# Patient Record
Sex: Male | Born: 1985
Health system: Southern US, Community
[De-identification: ages and names within clinical notes are randomized; demographics above are authoritative.]

## PROBLEM LIST (undated history)

## (undated) DIAGNOSIS — Z808 Family history of malignant neoplasm of other organs or systems: Secondary | ICD-10-CM

## (undated) DIAGNOSIS — Z8481 Family history of carrier of genetic disease: Secondary | ICD-10-CM

## (undated) DIAGNOSIS — K219 Gastro-esophageal reflux disease without esophagitis: Secondary | ICD-10-CM

## (undated) DIAGNOSIS — F32A Depression, unspecified: Secondary | ICD-10-CM

## (undated) DIAGNOSIS — F329 Major depressive disorder, single episode, unspecified: Secondary | ICD-10-CM

## (undated) DIAGNOSIS — I10 Essential (primary) hypertension: Secondary | ICD-10-CM

## (undated) DIAGNOSIS — Z8042 Family history of malignant neoplasm of prostate: Secondary | ICD-10-CM

## (undated) DIAGNOSIS — G479 Sleep disorder, unspecified: Secondary | ICD-10-CM

## (undated) DIAGNOSIS — Z800 Family history of malignant neoplasm of digestive organs: Secondary | ICD-10-CM

## (undated) DIAGNOSIS — F319 Bipolar disorder, unspecified: Secondary | ICD-10-CM

## (undated) HISTORY — DX: Family history of malignant neoplasm of prostate: Z80.42

## (undated) HISTORY — DX: Family history of carrier of genetic disease: Z84.81

## (undated) HISTORY — PX: NO PAST SURGERIES: SHX2092

## (undated) HISTORY — DX: Essential (primary) hypertension: I10

## (undated) HISTORY — DX: Family history of malignant neoplasm of other organs or systems: Z80.8

## (undated) HISTORY — DX: Gastro-esophageal reflux disease without esophagitis: K21.9

## (undated) HISTORY — DX: Family history of malignant neoplasm of digestive organs: Z80.0

---
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
## 2008-06-18 ENCOUNTER — Emergency Department (HOSPITAL_COMMUNITY): Admission: EM | Admit: 2008-06-18 | Discharge: 2008-06-18 | Payer: Self-pay | Admitting: Emergency Medicine

## 2008-11-28 ENCOUNTER — Emergency Department (HOSPITAL_COMMUNITY): Admission: EM | Admit: 2008-11-28 | Discharge: 2008-11-28 | Payer: Self-pay | Admitting: Emergency Medicine

## 2009-03-02 ENCOUNTER — Emergency Department (HOSPITAL_COMMUNITY): Admission: EM | Admit: 2009-03-02 | Discharge: 2009-03-02 | Payer: Self-pay | Admitting: Emergency Medicine

## 2010-04-11 ENCOUNTER — Emergency Department (HOSPITAL_COMMUNITY)
Admission: EM | Admit: 2010-04-11 | Discharge: 2010-04-11 | Payer: Self-pay | Source: Home / Self Care | Admitting: Emergency Medicine

## 2010-08-12 ENCOUNTER — Emergency Department (HOSPITAL_COMMUNITY)
Admission: EM | Admit: 2010-08-12 | Discharge: 2010-08-12 | Disposition: A | Discharge status: Home / Self Care | Payer: Self-pay | Attending: Emergency Medicine | Admitting: Emergency Medicine

## 2010-08-12 ENCOUNTER — Emergency Department (HOSPITAL_COMMUNITY): Payer: Self-pay

## 2010-08-12 DIAGNOSIS — F17200 Nicotine dependence, unspecified, uncomplicated: Secondary | ICD-10-CM | POA: Insufficient documentation

## 2010-08-12 DIAGNOSIS — IMO0002 Reserved for concepts with insufficient information to code with codable children: Secondary | ICD-10-CM | POA: Insufficient documentation

## 2010-08-12 DIAGNOSIS — F319 Bipolar disorder, unspecified: Secondary | ICD-10-CM | POA: Insufficient documentation

## 2010-08-12 DIAGNOSIS — T182XXA Foreign body in stomach, initial encounter: Secondary | ICD-10-CM | POA: Insufficient documentation

## 2011-11-21 ENCOUNTER — Encounter (HOSPITAL_COMMUNITY): Payer: Self-pay | Admitting: *Deleted

## 2011-11-21 ENCOUNTER — Emergency Department (HOSPITAL_COMMUNITY)
Admission: EM | Admit: 2011-11-21 | Discharge: 2011-11-21 | Disposition: A | Discharge status: Home / Self Care | Payer: Self-pay | Attending: Emergency Medicine | Admitting: Emergency Medicine

## 2011-11-21 DIAGNOSIS — L0291 Cutaneous abscess, unspecified: Secondary | ICD-10-CM

## 2011-11-21 DIAGNOSIS — F17200 Nicotine dependence, unspecified, uncomplicated: Secondary | ICD-10-CM | POA: Insufficient documentation

## 2011-11-21 DIAGNOSIS — H60399 Other infective otitis externa, unspecified ear: Secondary | ICD-10-CM | POA: Insufficient documentation

## 2011-11-21 MED ORDER — DOXYCYCLINE HYCLATE 100 MG PO CAPS: 100.0000 mg | ORAL_CAPSULE | Freq: Two times a day (BID) | ORAL | Status: AC

## 2011-11-21 MED ORDER — HYDROCODONE-ACETAMINOPHEN 5-325 MG PO TABS: ORAL_TABLET | ORAL | Status: DC

## 2011-11-21 MED ORDER — LIDOCAINE HCL (PF) 1 % IJ SOLN
INTRAMUSCULAR | Status: AC
  Administered 2011-11-21: 5 mL
  Filled 2011-11-21: qty 5

## 2011-11-21 MED ORDER — DOXYCYCLINE HYCLATE 100 MG PO TABS
100.0000 mg | ORAL_TABLET | Freq: Once | ORAL | Status: AC
  Administered 2011-11-21: 100 mg via ORAL
  Filled 2011-11-21: qty 1

## 2011-11-21 NOTE — ED Notes (Signed)
Abscess to back of left ear x 4 days.

## 2011-11-21 NOTE — Discharge Instructions (Signed)
Abscess An abscess (boil or furuncle) is an infected area that contains a collection of pus.  SYMPTOMS Signs and symptoms of an abscess include pain, tenderness, redness, or hardness. You may feel a moveable soft area under your skin. An abscess can occur anywhere in the body.  TREATMENT  A surgical cut (incision) may be made over your abscess to drain the pus. Gauze may be packed into the space or a drain may be looped through the abscess cavity (pocket). This provides a drain that will allow the cavity to heal from the inside outwards. The abscess may be painful for a few days, but should feel much better if it was drained.  Your abscess, if seen early, may not have localized and may not have been drained. If not, another appointment may be required if it does not get better on its own or with medications. HOME CARE INSTRUCTIONS   Only take over-the-counter or prescription medicines for pain, discomfort, or fever as directed by your caregiver.   Take your antibiotics as directed if they were prescribed. Finish them even if you start to feel better.   Keep the skin and clothes clean around your abscess.   If the abscess was drained, you will need to use gauze dressing to collect any draining pus. Dressings will typically need to be changed 3 or more times a day.   The infection may spread by skin contact with others. Avoid skin contact as much as possible.   Practice good hygiene. This includes regular hand washing, cover any draining skin lesions, and do not share personal care items.   If you participate in sports, do not share athletic equipment, towels, whirlpools, or personal care items. Shower after every practice or tournament.   If a draining area cannot be adequately covered:   Do not participate in sports.   Children should not participate in day care until the wound has healed or drainage stops.   If your caregiver has given you a follow-up appointment, it is very important  to keep that appointment. Not keeping the appointment could result in a much worse infection, chronic or permanent injury, pain, and disability. If there is any problem keeping the appointment, you must call back to this facility for assistance.  SEEK MEDICAL CARE IF:   You develop increased pain, swelling, redness, drainage, or bleeding in the wound site.   You develop signs of generalized infection including muscle aches, chills, fever, or a general ill feeling.   You have an oral temperature above 102 F (38.9 C).  MAKE SURE YOU:   Understand these instructions.   Will watch your condition.   Will get help right away if you are not doing well or get worse.  Document Released: 03/07/2005 Document Revised: 05/17/2011 Document Reviewed: 12/30/2007 [facility] Patient Information 2012 [place], [place].Heat Therapy Your caregiver advises heat therapy for your condition. Heat applications help reduce pain and muscle spasm around injuries or areas of inflammation. They also increase blood flow to the area which can speed healing. Moist heat is commonly used to help heal skin infections. Heat treatments should be used for about 30-40 minutes every 2-4 hours. Shorter treatments should be used if there is discomfort. Different forms of heat therapy are:  Warm water - Use a basin or tub filled with heated water; change it often to keep the water hot. The water temperature should not be uncomfortable to the skin.   Hot packs - Use several bath towels soaked in hot  water and lightly wrung out. These should be changed every 5-10 minutes. You can buy commercially-available packs that provide more sustained heat. Hot water bottles are not recommended because they give only a small amount of heat.   Electric heating pads - These may be used for dry heat only. Do not use wet material around a regular heating pad because of the risk of electrical shock. Do not leave heating pads on for long periods as they can  burn the skin or cause permanent discoloration. Do not lie on top of a heating pad because, again, this can cause a burn.   Heat lamps - Use an infrared light. Keep the bulb 15-25 inches from the skin. Watch for signs of excessive heat (blotchy areas will appear).  Be cautious with heat therapy to avoid burning the skin. You should not use heat therapy without careful medical supervision if you have: circulation problems, numbness or unusual swelling in the area to be treated. Document Released: 05/28/2005 Document Revised: 05/17/2011 Document Reviewed: 11/23/2006 [facility] Patient Information 2012 [place], [place].   Take the meds as directed.  Take ibuprofen 800 mg every 8 hrs with food.  Apply warm compresses several times daily.  Remove packing from ear in 5-6 days.  Follow up with your MD as needed.

## 2011-11-21 NOTE — ED Notes (Signed)
Pa in to d/c abscess

## 2011-11-21 NOTE — ED Provider Notes (Signed)
History     CSN: [id number]  Arrival date & time 11/21/11  1723   First MD Initiated Contact with Patient 11/21/11 1735      Chief Complaint  Patient presents with  . Abscess    (Consider location/radiation/quality/duration/timing/severity/associated sxs/prior treatment) HPI Comments: Gradual swelling behind lobe of L ear.  Has been squeezing.  Patient is a 26 y.o. male presenting with abscess. The history is provided by the patient. No language interpreter was used.  Abscess  This is a new problem. Episode onset: 1 week ago. The onset was sudden. The problem has been gradually worsening. The problem is moderate. The abscess is characterized by painfulness. Pertinent negatives include no fever. There were no sick contacts. He has received no recent medical care.    History reviewed. No pertinent past medical history.  History reviewed. No pertinent past surgical history.  No family history on file.  History  Substance Use Topics  . Smoking status: Current Everyday Smoker    Types: Cigarettes  . Smokeless tobacco: Not on file  . Alcohol Use: No      Review of Systems  Constitutional: Negative for fever and chills.  Skin:       Abscess   All other systems reviewed and are negative.    Allergies  Review of patient's allergies indicates no known allergies.  Home Medications   Current Outpatient Rx  Name Route Sig Dispense Refill  . QUETIAPINE FUMARATE ER 200 MG PO TB24 Oral Take 200 mg by mouth at bedtime.    Marland Kitchen DOXYCYCLINE HYCLATE 100 MG PO CAPS Oral Take 1 capsule (100 mg total) by mouth 2 (two) times daily. 20 capsule 0  . HYDROCODONE-ACETAMINOPHEN 5-325 MG PO TABS  One tab po q 4-6 hrs prn pain 12 tablet 0    BP 144/75  Pulse 96  Temp 98.6 F (37 C) (Oral)  Resp 20  Ht 6\' 1"  (1.854 m)  Wt 285 lb (129.275 kg)  BMI 37.60 kg/m2  SpO2 98%  Physical Exam  Nursing note and vitals reviewed. Constitutional: He is oriented to person, place, and time. He  appears well-developed and well-nourished.  HENT:  Head: Normocephalic and atraumatic.  Right Ear: Hearing and external ear normal.  Left Ear: Hearing normal.  Ears:  Eyes: EOM are normal.  Neck: Normal range of motion.  Cardiovascular: Normal rate, regular rhythm, normal heart sounds and intact distal pulses.   Pulmonary/Chest: Effort normal and breath sounds normal. No respiratory distress.  Abdominal: Soft. He exhibits no distension. There is no tenderness.  Musculoskeletal: Normal range of motion.  Neurological: He is alert and oriented to person, place, and time.  Skin: Skin is warm and dry.  Psychiatric: He has a normal mood and affect. Judgment normal.    ED Course  INCISION AND DRAINAGE Date/Time: 11/21/2011 6:05 PM Performed by: Worthy Rancher Authorized by: Worthy Rancher Consent: Verbal consent obtained. Written consent not obtained. Risks and benefits: risks, benefits and alternatives were discussed Consent given by: patient Patient understanding: patient states understanding of the procedure being performed Site marked: the operative site was not marked Imaging studies: imaging studies not available Required items: required blood products, implants, devices, and special equipment available Patient identity confirmed: verbally with patient Time out: Immediately prior to procedure a "time out" was called to verify the correct patient, procedure, equipment, support staff and site/side marked as required. Type: abscess Body area: head/neck Location details: left external ear Anesthesia: local infiltration Local anesthetic: lidocaine 1%  without epinephrine Anesthetic total: 2 ml Patient sedated: no Scalpel size: 11 Needle gauge: 25 ga. Incision type: single straight Complexity: simple Drainage: purulent Drainage amount: copious Packing material: 1/4 in gauze (~ 3 inches inserted) Patient tolerance: Patient tolerated the procedure well with no immediate  complications.   (including critical care time)  Labs Reviewed - No data to display No results found.   1. Abscess       MDM  Simple abscess I&D. rx-doxycycline , 20 rx-hydrocodone, 12 OTC ibuprofen 800 mg TID.  Warm compresses        Worthy Rancher, [place] 11/21/11 1831

## 2011-11-23 NOTE — ED Provider Notes (Signed)
Medical screening examination/treatment/procedure(s) were performed by non-physician practitioner and as supervising physician I was immediately available for consultation/collaboration.   Laray Anger, DO 11/23/11 1143

## 2012-05-29 ENCOUNTER — Encounter (HOSPITAL_COMMUNITY): Payer: Self-pay

## 2012-05-29 ENCOUNTER — Emergency Department (HOSPITAL_COMMUNITY)
Admission: EM | Admit: 2012-05-29 | Discharge: 2012-05-29 | Disposition: A | Discharge status: Home / Self Care | Payer: Self-pay | Attending: Emergency Medicine | Admitting: Emergency Medicine

## 2012-05-29 DIAGNOSIS — Z87898 Personal history of other specified conditions: Secondary | ICD-10-CM | POA: Insufficient documentation

## 2012-05-29 DIAGNOSIS — R059 Cough, unspecified: Secondary | ICD-10-CM | POA: Insufficient documentation

## 2012-05-29 DIAGNOSIS — B349 Viral infection, unspecified: Secondary | ICD-10-CM

## 2012-05-29 DIAGNOSIS — L519 Erythema multiforme, unspecified: Secondary | ICD-10-CM | POA: Insufficient documentation

## 2012-05-29 DIAGNOSIS — K1370 Unspecified lesions of oral mucosa: Secondary | ICD-10-CM | POA: Insufficient documentation

## 2012-05-29 DIAGNOSIS — F319 Bipolar disorder, unspecified: Secondary | ICD-10-CM | POA: Insufficient documentation

## 2012-05-29 DIAGNOSIS — Z2089 Contact with and (suspected) exposure to other communicable diseases: Secondary | ICD-10-CM | POA: Insufficient documentation

## 2012-05-29 DIAGNOSIS — R05 Cough: Secondary | ICD-10-CM

## 2012-05-29 DIAGNOSIS — IMO0001 Reserved for inherently not codable concepts without codable children: Secondary | ICD-10-CM | POA: Insufficient documentation

## 2012-05-29 DIAGNOSIS — F329 Major depressive disorder, single episode, unspecified: Secondary | ICD-10-CM | POA: Insufficient documentation

## 2012-05-29 DIAGNOSIS — R197 Diarrhea, unspecified: Secondary | ICD-10-CM | POA: Insufficient documentation

## 2012-05-29 DIAGNOSIS — R509 Fever, unspecified: Secondary | ICD-10-CM | POA: Insufficient documentation

## 2012-05-29 DIAGNOSIS — F3289 Other specified depressive episodes: Secondary | ICD-10-CM | POA: Insufficient documentation

## 2012-05-29 DIAGNOSIS — R51 Headache: Secondary | ICD-10-CM | POA: Insufficient documentation

## 2012-05-29 DIAGNOSIS — R1110 Vomiting, unspecified: Secondary | ICD-10-CM | POA: Insufficient documentation

## 2012-05-29 DIAGNOSIS — J029 Acute pharyngitis, unspecified: Secondary | ICD-10-CM | POA: Insufficient documentation

## 2012-05-29 DIAGNOSIS — F17200 Nicotine dependence, unspecified, uncomplicated: Secondary | ICD-10-CM | POA: Insufficient documentation

## 2012-05-29 DIAGNOSIS — B9789 Other viral agents as the cause of diseases classified elsewhere: Secondary | ICD-10-CM | POA: Insufficient documentation

## 2012-05-29 DIAGNOSIS — Z79899 Other long term (current) drug therapy: Secondary | ICD-10-CM | POA: Insufficient documentation

## 2012-05-29 HISTORY — DX: Depression, unspecified: F32.A

## 2012-05-29 HISTORY — DX: Major depressive disorder, single episode, unspecified: F32.9

## 2012-05-29 HISTORY — DX: Bipolar disorder, unspecified: F31.9

## 2012-05-29 HISTORY — DX: Sleep disorder, unspecified: G47.9

## 2012-05-29 MED ORDER — IBUPROFEN 800 MG PO TABS
800.0000 mg | ORAL_TABLET | Freq: Once | ORAL | Status: AC
  Administered 2012-05-29: 800 mg via ORAL
  Filled 2012-05-29: qty 1

## 2012-05-29 MED ORDER — ACETAMINOPHEN 500 MG PO TABS
ORAL_TABLET | ORAL | Status: AC
  Administered 2012-05-29: 1000 mg via ORAL
  Filled 2012-05-29: qty 2

## 2012-05-29 MED ORDER — IBUPROFEN 800 MG PO TABS: 800.0000 mg | ORAL_TABLET | Freq: Three times a day (TID) | ORAL | Status: DC

## 2012-05-29 MED ORDER — ONDANSETRON 8 MG PO TBDP
8.0000 mg | ORAL_TABLET | Freq: Once | ORAL | Status: AC
  Administered 2012-05-29: 8 mg via ORAL
  Filled 2012-05-29: qty 1

## 2012-05-29 MED ORDER — HYDROCOD POLST-CHLORPHEN POLST 10-8 MG/5ML PO LQCR
5.0000 mL | Freq: Once | ORAL | Status: AC
  Administered 2012-05-29: 5 mL via ORAL
  Filled 2012-05-29: qty 5

## 2012-05-29 MED ORDER — ACETAMINOPHEN 500 MG PO TABS
1000.0000 mg | ORAL_TABLET | Freq: Once | ORAL | Status: AC
  Administered 2012-05-29: 1000 mg via ORAL

## 2012-05-29 MED ORDER — ALBUTEROL SULFATE HFA 108 (90 BASE) MCG/ACT IN AERS
2.0000 | INHALATION_SPRAY | Freq: Once | RESPIRATORY_TRACT | Status: AC
  Administered 2012-05-29: 2 via RESPIRATORY_TRACT
  Filled 2012-05-29: qty 6.7

## 2012-05-29 MED ORDER — PROMETHAZINE-CODEINE 6.25-10 MG/5ML PO SYRP: 5.0000 mL | ORAL_SOLUTION | ORAL | Status: DC | PRN

## 2012-05-29 NOTE — ED Notes (Signed)
Pt reports cough, congestion, vomiting and diarrhea that started yesterday.

## 2012-05-29 NOTE — ED Notes (Signed)
Family at bedside. 

## 2012-05-29 NOTE — ED Notes (Signed)
Pt given ice water for oral trial.

## 2012-05-29 NOTE — ED Provider Notes (Signed)
History     CSN: [id number]  Arrival date & time 05/29/12  4098   First MD Initiated Contact with Patient 05/29/12 [phone]      Chief Complaint  Patient presents with  . Cough  . Nasal Congestion  . Emesis  . Diarrhea    (Consider location/radiation/quality/duration/timing/severity/associated sxs/prior treatment) HPI Comments: Patient complains of nasal congestion, body aches, fever, chills, nonproductive cough and posttussive emesis with occasional loose stools. Symptoms began yesterday. Onset was sudden. States his wife has recently had similar symptoms. He denies wheezing, shortness of breath, abdominal pain, dysuria, or chest pain.  Patient is a 26 y.o. male presenting with cough, vomiting, and diarrhea. The history is provided by the patient.  Cough This is a new problem. The current episode started yesterday. The problem occurs every few minutes. The problem has been gradually worsening. The cough is non-productive. The maximum temperature recorded prior to his arrival was 101 to 101.9 F. The fever has been present for 1 to 2 days. Associated symptoms include chills, headaches, rhinorrhea, sore throat and myalgias. Pertinent negatives include no chest pain, no ear pain, no shortness of breath and no wheezing. He has tried nothing for the symptoms. The treatment provided no relief. Risk factors: Other family members with similar symptoms. He is a smoker. His past medical history does not include pneumonia or asthma.  Emesis  Associated symptoms include chills, cough, diarrhea, headaches and myalgias. Pertinent negatives include no abdominal pain and no fever.  Diarrhea The primary symptoms include vomiting, diarrhea and myalgias. Primary symptoms do not include fever, abdominal pain, nausea or dysuria.  The myalgias are not associated with weakness.  The illness is also significant for chills.    Past Medical History  Diagnosis Date  . Bipolar 1 disorder   . Depression   . Sleep  disorder     History reviewed. No pertinent past surgical history.  No family history on file.  History  Substance Use Topics  . Smoking status: Current Every Day Smoker    Types: Cigarettes  . Smokeless tobacco: Not on file  . Alcohol Use: No      Review of Systems  Constitutional: Positive for chills. Negative for fever, activity change and appetite change.  HENT: Positive for congestion, sore throat, rhinorrhea and sneezing. Negative for ear pain, facial swelling, trouble swallowing, neck pain and neck stiffness.   Eyes: Negative for visual disturbance.  Respiratory: Positive for cough. Negative for chest tightness, shortness of breath, wheezing and stridor.   Cardiovascular: Negative for chest pain.  Gastrointestinal: Positive for vomiting and diarrhea. Negative for nausea and abdominal pain.  Genitourinary: Negative for dysuria.  Musculoskeletal: Positive for myalgias.  Skin: Negative.   Neurological: Positive for headaches. Negative for dizziness, weakness and numbness.  Hematological: Negative for adenopathy.  Psychiatric/Behavioral: Negative for confusion.  All other systems reviewed and are negative.    Allergies  Review of patient's allergies indicates no known allergies.  Home Medications   Current Outpatient Rx  Name  Route  Sig  Dispense  Refill  . HYDROCODONE-ACETAMINOPHEN 5-325 MG PO TABS      One tab po q 4-6 hrs prn pain   12 tablet   0   . QUETIAPINE FUMARATE ER 200 MG PO TB24   Oral   Take 200 mg by mouth at bedtime.           BP 117/52  Pulse 116  Temp 101.7 F (38.7 C) (Oral)  Resp 20  Ht 6'  2" (1.88 m)  Wt 285 lb (129.275 kg)  BMI 36.59 kg/m2  SpO2 94%  Physical Exam  Nursing note and vitals reviewed. Constitutional: He is oriented to person, place, and time. He appears well-developed and well-nourished. No distress.  HENT:  Head: Normocephalic and atraumatic.  Right Ear: Tympanic membrane and ear canal normal.  Left Ear:  Tympanic membrane and ear canal normal.  Nose: Mucosal edema and rhinorrhea present.  Mouth/Throat: Uvula is midline and mucous membranes are normal. Posterior oropharyngeal erythema present. No oropharyngeal exudate, posterior oropharyngeal edema or tonsillar abscesses.  Eyes: Conjunctivae normal and EOM are normal. Pupils are equal, round, and reactive to light.  Neck: Normal range of motion. Neck supple.  Cardiovascular: Normal rate, regular rhythm, normal heart sounds and intact distal pulses.   No murmur heard. Pulmonary/Chest: Effort normal. No respiratory distress. He has no rales. He exhibits no tenderness.       Coarse lungs sounds bilaterally.  No wheezing or rales  Abdominal: Soft. He exhibits no distension. There is no tenderness. There is no rebound and no guarding.  Musculoskeletal: He exhibits no edema.  Lymphadenopathy:    He has no cervical adenopathy.  Neurological: He is alert and oriented to person, place, and time. He exhibits normal muscle tone. Coordination normal.  Skin: Skin is warm and dry.    ED Course  Procedures (including critical care time)  Labs Reviewed - No data to display No results found.   MDM    Vital signs improved. Patient is nontoxic appearing, abdomen remains soft and nontender. Patient has tolerated oral fluid trial. No vomiting during ED stay. Symptoms are likely related to viral illness will treat symptomatically. Patient agrees to close followup with his primary care physician  He has received Tylenol, ibuprofen, Zofran and Tussionex while in the department. He states he is feeling better. Albuterol inhaler was dispensed  Prescribed: Ibuprofen 800 mg Phenergan with codeine cough syrup  Damen Windsor L. [place], [place] 05/30/12 2358

## 2012-06-03 NOTE — ED Provider Notes (Signed)
Medical screening examination/treatment/procedure(s) were performed by non-physician practitioner and as supervising physician I was immediately available for consultation/collaboration.  Deari Sessler, MD 06/03/12 1838 

## 2013-01-24 ENCOUNTER — Emergency Department (HOSPITAL_COMMUNITY): Payer: Self-pay

## 2013-01-24 ENCOUNTER — Encounter (HOSPITAL_COMMUNITY): Payer: Self-pay | Admitting: Emergency Medicine

## 2013-01-24 ENCOUNTER — Emergency Department (HOSPITAL_COMMUNITY)
Admission: EM | Admit: 2013-01-24 | Discharge: 2013-01-24 | Disposition: A | Discharge status: Home / Self Care | Payer: Self-pay | Attending: Emergency Medicine | Admitting: Emergency Medicine

## 2013-01-24 DIAGNOSIS — R52 Pain, unspecified: Secondary | ICD-10-CM | POA: Insufficient documentation

## 2013-01-24 DIAGNOSIS — R112 Nausea with vomiting, unspecified: Secondary | ICD-10-CM | POA: Insufficient documentation

## 2013-01-24 DIAGNOSIS — R1013 Epigastric pain: Secondary | ICD-10-CM | POA: Insufficient documentation

## 2013-01-24 DIAGNOSIS — F17200 Nicotine dependence, unspecified, uncomplicated: Secondary | ICD-10-CM | POA: Insufficient documentation

## 2013-01-24 DIAGNOSIS — Z8659 Personal history of other mental and behavioral disorders: Secondary | ICD-10-CM | POA: Insufficient documentation

## 2013-01-24 DIAGNOSIS — Z8669 Personal history of other diseases of the nervous system and sense organs: Secondary | ICD-10-CM | POA: Insufficient documentation

## 2013-01-24 DIAGNOSIS — M549 Dorsalgia, unspecified: Secondary | ICD-10-CM | POA: Insufficient documentation

## 2013-01-24 LAB — CBC WITH DIFFERENTIAL/PLATELET
Basophils Absolute: 0.1 10*3/uL (ref 0.0–0.1)
Eosinophils Relative: 2 % (ref 0–5)
HCT: 47.4 % (ref 39.0–52.0)
Hemoglobin: 15.6 g/dL (ref 13.0–17.0)
Lymphocytes Relative: 26 % (ref 12–46)
Lymphs Abs: 3.6 10*3/uL (ref 0.7–4.0)
MCH: 27.1 pg (ref 26.0–34.0)
Platelets: 273 10*3/uL (ref 150–400)
RDW: 14.6 % (ref 11.5–15.5)
WBC: 14 10*3/uL — ABNORMAL HIGH (ref 4.0–10.5)

## 2013-01-24 LAB — COMPREHENSIVE METABOLIC PANEL
AST: 13 U/L (ref 0–37)
Alkaline Phosphatase: 81 U/L (ref 39–117)
BUN: 8 mg/dL (ref 6–23)
CO2: 28 mEq/L (ref 19–32)
Glucose, Bld: 98 mg/dL (ref 70–99)
Sodium: 133 mEq/L — ABNORMAL LOW (ref 135–145)
Total Protein: 7.6 g/dL (ref 6.0–8.3)

## 2013-01-24 MED ORDER — IOHEXOL 300 MG/ML  SOLN
50.0000 mL | Freq: Once | INTRAMUSCULAR | Status: AC | PRN
  Administered 2013-01-24: 50 mL via ORAL

## 2013-01-24 MED ORDER — FAMOTIDINE 20 MG PO TABS: 20.0000 mg | ORAL_TABLET | Freq: Two times a day (BID) | ORAL | Status: DC

## 2013-01-24 MED ORDER — HYDROMORPHONE HCL PF 1 MG/ML IJ SOLN
1.0000 mg | Freq: Once | INTRAMUSCULAR | Status: AC
  Administered 2013-01-24: 1 mg via INTRAVENOUS
  Filled 2013-01-24: qty 1

## 2013-01-24 MED ORDER — SODIUM CHLORIDE 0.9 % IV BOLUS (SEPSIS)
1000.0000 mL | Freq: Once | INTRAVENOUS | Status: AC
  Administered 2013-01-24: 1000 mL via INTRAVENOUS

## 2013-01-24 MED ORDER — IOHEXOL 300 MG/ML  SOLN: 50.0000 mL | Freq: Once | INTRAMUSCULAR | Status: DC | PRN

## 2013-01-24 MED ORDER — PROMETHAZINE HCL 25 MG PO TABS: 25.0000 mg | ORAL_TABLET | Freq: Four times a day (QID) | ORAL | Status: DC | PRN

## 2013-01-24 MED ORDER — SODIUM CHLORIDE 0.9 % IV SOLN: INTRAVENOUS | Status: DC

## 2013-01-24 MED ORDER — ONDANSETRON HCL 4 MG/2ML IJ SOLN
4.0000 mg | Freq: Once | INTRAMUSCULAR | Status: AC
  Administered 2013-01-24: 4 mg via INTRAVENOUS
  Filled 2013-01-24: qty 2

## 2013-01-24 MED ORDER — PANTOPRAZOLE SODIUM 40 MG IV SOLR
40.0000 mg | Freq: Once | INTRAVENOUS | Status: AC
  Administered 2013-01-24: 40 mg via INTRAVENOUS
  Filled 2013-01-24: qty 40

## 2013-01-24 MED ORDER — HYDROCODONE-ACETAMINOPHEN 5-325 MG PO TABS: 1.0000 | ORAL_TABLET | Freq: Four times a day (QID) | ORAL | Status: DC | PRN

## 2013-01-24 MED ORDER — IOHEXOL 300 MG/ML  SOLN
100.0000 mL | Freq: Once | INTRAMUSCULAR | Status: AC | PRN
  Administered 2013-01-24: 100 mL via INTRAVENOUS

## 2013-01-24 MED ORDER — SODIUM CHLORIDE 0.9 % IV SOLN
INTRAVENOUS | Status: DC
  Administered 2013-01-24: 13:00:00 via INTRAVENOUS

## 2013-01-24 NOTE — ED Notes (Signed)
Pt reported being awoken from sleep this morning with a burning abdominal pain at 7:30am. Pt reports nausea.

## 2013-01-24 NOTE — ED Provider Notes (Signed)
CSN: [id number]     Arrival date & time 01/24/13  7829 History    This chart was scribed for Ian Jakes, MD,  by Ashley Jacobs, ED Scribe. The patient was seen in room APA11/APA11 and the patient's care was started at 11:01 AM   First MD Initiated Contact with Patient 01/24/13 1016     Chief Complaint  Patient presents with  . Abdominal Pain   (Consider location/radiation/quality/duration/timing/severity/associated sxs/prior Treatment) HPI HPI Comments: Ian Gonzalez is a 27 y.o. male who presents to the Emergency Department complaining of burning, constant, RUQ, abdominal pain that presented 3 hour prior to arriving. He has the associated symptoms of nausea and vomiting. Pt mention the pain intermittently radiates to his mid back, worsened with lying on his R side and nothing seems to relieve pain. A week ago, pt had a similar episode, less severe which was resolved with milk. Prior to arriving pt ingested milk and Pepto Bismol with out relief. Pt reports taking Goody's powder 6 hours before arrive with no improvement.  Pt denies diarrhea, fever and chills.   Pt does not currently have a PCP.    Past Medical History  Diagnosis Date  . Bipolar 1 disorder   . Depression   . Sleep disorder    History reviewed. No pertinent past surgical history. No family history on file. History  Substance Use Topics  . Smoking status: Current Every Day Smoker -- 1.00 packs/day for 10 years    Types: Cigarettes  . Smokeless tobacco: Not on file  . Alcohol Use: Yes     Comment: Occassionally     Review of Systems  Constitutional: Negative for fever and chills.  HENT: Negative for congestion, sore throat, rhinorrhea and neck pain.   Eyes: Negative for visual disturbance.  Respiratory: Negative for shortness of breath.   Cardiovascular: Negative for chest pain.  Gastrointestinal: Positive for nausea, vomiting and abdominal pain. Negative for diarrhea.  Genitourinary: Negative for  dysuria and hematuria.  Musculoskeletal: Positive for back pain. Negative for joint swelling.  Neurological: Negative for headaches.  Hematological: Does not bruise/bleed easily.  Psychiatric/Behavioral: Negative for confusion.  All other systems reviewed and are negative.    Allergies  Review of patient's allergies indicates no known allergies.  Home Medications   Current Outpatient Rx  Name  Route  Sig  Dispense  Refill  . Aspirin-Acetaminophen-Caffeine (GOODY HEADACHE PO)   Oral   Take 1 packet by mouth daily as needed.         . trazodone (DESYREL) 300 MG tablet   Oral   Take 300 mg by mouth at bedtime.          BP 132/84  Pulse 84  Temp(Src) 98 F (36.7 C) (Oral)  Resp 18  Ht 6\' 2"  (1.88 m)  Wt 285 lb (129.275 kg)  BMI 36.58 kg/m2  SpO2 97% Physical Exam  Nursing note and vitals reviewed. Constitutional: He is oriented to person, place, and time. He appears well-developed and well-nourished. No distress.  HENT:  Head: Normocephalic and atraumatic.  Mouth/Throat: Oropharynx is clear and moist.  Moist mucus membranes   Eyes: Conjunctivae are normal. Pupils are equal, round, and reactive to light. No scleral icterus.  Neck: Normal range of motion. Neck supple.  Cardiovascular: Normal rate, regular rhythm, normal heart sounds and intact distal pulses.   No murmur heard. Pulmonary/Chest: Effort normal and breath sounds normal. No stridor. No respiratory distress. He has no wheezes. He has no rales.  Abdominal: Soft. Bowel sounds are normal. There is tenderness. There is guarding.  Tenderness with guarding in RUQ  Musculoskeletal: Normal range of motion. He exhibits no edema.  Neurological: He is alert and oriented to person, place, and time. No cranial nerve deficit. He exhibits normal muscle tone. Coordination normal.  Skin: Skin is warm and dry. No rash noted.  Psychiatric: He has a normal mood and affect. His behavior is normal.    ED Course  DIAGNOSTIC  STUDIES: Oxygen Saturation is 97% on room air, adequate by my interpretation.    COORDINATION OF CARE: 11:05 AM Discussed course of care with pt . Pt understands and agrees.  Medications  sodium chloride 0.9 % bolus 1,000 mL (0 mL Intravenous Stopped 01/24/13 1314)  ondansetron (ZOFRAN) injection 4 mg (4 mg Intravenous Given 01/24/13 1138)  HYDROmorphone (DILAUDID) injection 1 mg (1 mg Intravenous Given 01/24/13 1138)  iohexol (OMNIPAQUE) 300 MG/ML solution 100 mL (100 mL Intravenous Contrast Given 01/24/13 1150)  iohexol (OMNIPAQUE) 300 MG/ML solution 50 mL (50 mL Oral Contrast Given 01/24/13 1149)  pantoprazole (PROTONIX) injection 40 mg (40 mg Intravenous Given 01/24/13 1416)     Procedures (including critical care time)  Labs Reviewed  COMPREHENSIVE METABOLIC PANEL - Abnormal; Notable for the following:    Sodium 133 (*)    All other components within normal limits  CBC WITH DIFFERENTIAL - Abnormal; Notable for the following:    WBC 14.0 (*)    Neutro Abs 9.3 (*)    All other components within normal limits  LIPASE, BLOOD   Results for orders placed during the hospital encounter of 01/24/13  COMPREHENSIVE METABOLIC PANEL      Result Value Range   Sodium 133 (*) 135 - 145 mEq/L   Potassium 4.4  3.5 - 5.1 mEq/L   Chloride 98  96 - 112 mEq/L   CO2 28  19 - 32 mEq/L   Glucose, Bld 98  70 - 99 mg/dL   BUN 8  6 - 23 mg/dL   Creatinine, Ser 1.61  0.50 - 1.35 mg/dL   Calcium 9.7  8.4 - 09.6 mg/dL   Total Protein 7.6  6.0 - 8.3 g/dL   Albumin 3.5  3.5 - 5.2 g/dL   AST 13  0 - 37 U/L   ALT 12  0 - 53 U/L   Alkaline Phosphatase 81  39 - 117 U/L   Total Bilirubin 0.3  0.3 - 1.2 mg/dL   GFR calc non Af Amer >90  >90 mL/min   GFR calc Af Amer >90  >90 mL/min  LIPASE, BLOOD      Result Value Range   Lipase 19  11 - 59 U/L  CBC WITH DIFFERENTIAL      Result Value Range   WBC 14.0 (*) 4.0 - 10.5 K/uL   RBC 5.76  4.22 - 5.81 MIL/uL   Hemoglobin 15.6  13.0 - 17.0 g/dL   HCT 04.5   40.9 - 81.1 %   MCV 82.3  78.0 - 100.0 fL   MCH 27.1  26.0 - 34.0 pg   MCHC 32.9  30.0 - 36.0 g/dL   RDW 91.4  78.2 - 95.6 %   Platelets 273  150 - 400 K/uL   Neutrophils Relative % 67  43 - 77 %   Neutro Abs 9.3 (*) 1.7 - 7.7 K/uL   Lymphocytes Relative 26  12 - 46 %   Lymphs Abs 3.6  0.7 - 4.0 K/uL  Monocytes Relative 5  3 - 12 %   Monocytes Absolute 0.7  0.1 - 1.0 K/uL   Eosinophils Relative 2  0 - 5 %   Eosinophils Absolute 0.3  0.0 - 0.7 K/uL   Basophils Relative 0  0 - 1 %   Basophils Absolute 0.1  0.0 - 0.1 K/uL      Dg Chest 2 View  01/24/2013   *RADIOLOGY REPORT*  Clinical Data: Epigastric pain  CHEST - 2 VIEW  Comparison: 06/18/2008  Findings: The heart, mediastinum and hila are within normal limits.  The lungs are clear.  No pleural effusion or pneumothorax.  The bony thorax and surrounding soft tissues are unremarkable.  IMPRESSION: Normal chest radiographs.   Original Report Authenticated By: Amie Portland, M.D.   Ct Abdomen Pelvis W Contrast  01/24/2013   *RADIOLOGY REPORT*  Clinical Data: Epigastric pain  CT ABDOMEN AND PELVIS WITH CONTRAST  Technique:  Multidetector CT imaging of the abdomen and pelvis was performed following the standard protocol during bolus administration of intravenous contrast.  Contrast: 50mL OMNIPAQUE IOHEXOL 300 MG/ML  SOLN, OMNIPAQUE IOHEXOL 300 MG/ML  SOLN  Comparison: None.  Findings: Sagittal images of the spine are unremarkable.  Lung bases are unremarkable.  Minimal fatty infiltration of the liver. No focal hepatic mass.  No calcified gallstones are noted within gallbladder.  No pericholecystic fluid.  The pancreas, spleen and adrenal glands are unremarkable.  Abdominal aorta is unremarkable.  Kidneys are symmetrical in size and enhancement.  No hydronephrosis or hydroureter. No mesenteric or retroperitoneal fluid collection.  No small bowel obstruction.  No ascites or free air.  No adenopathy.  No pericecal inflammation.  Normal appendix  is clearly visualized axial image 54.  No colonic obstruction.  The urinary bladder is unremarkable. Prostate gland and seminal vesicles are unremarkable.  There are enlarged right inguinal lymph nodes the largest measures 1.9 x 1.1 cm. Second right inguinal lymph node measures 1.6 by 1.3 cm.  Multiple small left inguinal lymph nodes.  IMPRESSION:  1.  No acute inflammatory process within abdomen. 2.  No small bowel or colonic obstruction. 3.  No hydronephrosis or hydroureter. 4.  Mild enlarged right inguinal lymph nodes.  Although this may be reactive in nature lymphoproliferative disease cannot be excluded. Clinical correlation is necessary.  Follow-up examination in 3 months is recommended to ensure resolution or stability. Nonspecific left inguinal lymph nodes. 5.  No pericecal inflammation.  Normal appendix is clearly visualized.   Original Report Authenticated By: Natasha Mead, M.D.   1. Abdominal pain, acute, epigastric     MDM  Clinically suspected of biliary colic or cholecystitis. However patient's CT scan negative for any gallstones or abnormalities of the gallbladder. Patient does have a mild leukocytosis lipase is negative not consistent with pancreatitis. Liver function test are normal. No elevation in the bilirubin. Symptoms could be related to peptic ulcer disease. Treated the patient in the emergency department with protonic 7 we'll continue a course of Pepcid over the next 2 weeks. Patient given followup. Patient improved by time of discharge. No acute abdominal findings.  I personally performed the services described in this documentation, which was scribed in my presence. The recorded information has been reviewed and is accurate.     Ian Jakes, MD 01/25/13 [phone]

## 2013-04-03 ENCOUNTER — Emergency Department (HOSPITAL_COMMUNITY)
Admission: EM | Admit: 2013-04-03 | Discharge: 2013-04-03 | Disposition: A | Discharge status: Home / Self Care | Payer: Self-pay | Attending: Emergency Medicine | Admitting: Emergency Medicine

## 2013-04-03 ENCOUNTER — Encounter (HOSPITAL_COMMUNITY): Payer: Self-pay | Admitting: Emergency Medicine

## 2013-04-03 DIAGNOSIS — R51 Headache: Secondary | ICD-10-CM | POA: Insufficient documentation

## 2013-04-03 DIAGNOSIS — F17200 Nicotine dependence, unspecified, uncomplicated: Secondary | ICD-10-CM | POA: Insufficient documentation

## 2013-04-03 DIAGNOSIS — G478 Other sleep disorders: Secondary | ICD-10-CM | POA: Insufficient documentation

## 2013-04-03 DIAGNOSIS — K0889 Other specified disorders of teeth and supporting structures: Secondary | ICD-10-CM

## 2013-04-03 DIAGNOSIS — K089 Disorder of teeth and supporting structures, unspecified: Secondary | ICD-10-CM | POA: Insufficient documentation

## 2013-04-03 DIAGNOSIS — F319 Bipolar disorder, unspecified: Secondary | ICD-10-CM | POA: Insufficient documentation

## 2013-04-03 DIAGNOSIS — K029 Dental caries, unspecified: Secondary | ICD-10-CM | POA: Insufficient documentation

## 2013-04-03 MED ORDER — AMOXICILLIN 500 MG PO CAPS: 500.0000 mg | ORAL_CAPSULE | Freq: Three times a day (TID) | ORAL | Status: DC

## 2013-04-03 MED ORDER — TRAMADOL HCL 50 MG PO TABS: 50.0000 mg | ORAL_TABLET | Freq: Four times a day (QID) | ORAL | Status: DC | PRN

## 2013-04-03 NOTE — ED Notes (Signed)
Pt alert & oriented x4, stable gait. Patient given discharge instructions, paperwork & prescription(s). Patient  instructed to stop at the registration desk to finish any additional paperwork. Patient verbalized understanding. Pt left department w/ no further questions. 

## 2013-04-03 NOTE — ED Provider Notes (Signed)
CSN: [id number]     Arrival date & time 04/03/13  1835 History   First MD Initiated Contact with Patient 04/03/13 1919     Chief Complaint  Patient presents with  . Dental Pain   (Consider location/radiation/quality/duration/timing/severity/associated sxs/prior Treatment) Patient is a 27 y.o. male presenting with tooth pain. The history is provided by the patient.  Dental Pain Location:  Upper Upper teeth location:  2/RU 2nd molar Quality:  Throbbing and sharp Severity:  Moderate Onset quality:  Gradual Duration:  1 day Timing:  Constant Progression:  Worsening Chronicity:  New Context: dental caries and poor dentition   Context: not abscess, not malocclusion, not recent dental surgery and not trauma   Relieved by:  Nothing Worsened by:  Hot food/drink, cold food/drink and touching Ineffective treatments:  NSAIDs Associated symptoms: facial pain   Associated symptoms: no congestion, no difficulty swallowing, no drooling, no facial swelling, no fever, no gum swelling, no headaches, no neck pain, no neck swelling, no oral bleeding and no trismus   Risk factors: lack of dental care, periodontal disease and smoking     Past Medical History  Diagnosis Date  . Bipolar 1 disorder   . Depression   . Sleep disorder    History reviewed. No pertinent past surgical history. History reviewed. No pertinent family history. History  Substance Use Topics  . Smoking status: Current Every Day Smoker -- 1.00 packs/day for 10 years    Types: Cigarettes  . Smokeless tobacco: Not on file  . Alcohol Use: Yes     Comment: Occassionally     Review of Systems  Constitutional: Negative for fever and appetite change.  HENT: Positive for dental problem. Negative for congestion, drooling, facial swelling, sore throat and trouble swallowing.   Eyes: Negative for pain and visual disturbance.  Musculoskeletal: Negative for neck pain and neck stiffness.  Neurological: Negative for dizziness, facial  asymmetry and headaches.  Hematological: Negative for adenopathy.  All other systems reviewed and are negative.    Allergies  Review of patient's allergies indicates no known allergies.  Home Medications   Current Outpatient Rx  Name  Route  Sig  Dispense  Refill  . amoxicillin (AMOXIL) 500 MG capsule   Oral   Take 1 capsule (500 mg total) by mouth 3 (three) times daily.   21 capsule   0   . Aspirin-Acetaminophen-Caffeine (GOODY HEADACHE PO)   Oral   Take 1 packet by mouth daily as needed.         . famotidine (PEPCID) 20 MG tablet   Oral   Take 1 tablet (20 mg total) by mouth 2 (two) times daily.   30 tablet   0   . HYDROcodone-acetaminophen (NORCO/VICODIN) 5-325 MG per tablet   Oral   Take 1-2 tablets by mouth every 6 (six) hours as needed for pain.   14 tablet   0   . promethazine (PHENERGAN) 25 MG tablet   Oral   Take 1 tablet (25 mg total) by mouth every 6 (six) hours as needed for nausea.   12 tablet   0   . traMADol (ULTRAM) 50 MG tablet   Oral   Take 1 tablet (50 mg total) by mouth every 6 (six) hours as needed for pain.   15 tablet   0   . trazodone (DESYREL) 300 MG tablet   Oral   Take 300 mg by mouth at bedtime.          BP 114/81  Pulse 85  Temp(Src) 97.8 F (36.6 C) (Oral)  Resp 18  Ht 6\' 1"  (1.854 m)  Wt 285 lb (129.275 kg)  BMI 37.61 kg/m2  SpO2 99% Physical Exam  Nursing note and vitals reviewed. Constitutional: He is oriented to person, place, and time. He appears well-developed and well-nourished. No distress.  HENT:  Head: Normocephalic and atraumatic.  Right Ear: Tympanic membrane and ear canal normal.  Left Ear: Tympanic membrane and ear canal normal.  Mouth/Throat: Uvula is midline, oropharynx is clear and moist and mucous membranes are normal. No trismus in the jaw. Dental caries present. No dental abscesses or uvula swelling.  Dental caries of the right upper molar.  No facial swelling, obvious dental abscess, trismus,  or sublingual abnml.    Neck: Normal range of motion. Neck supple.  Cardiovascular: Normal rate, regular rhythm and normal heart sounds.   No murmur heard. Pulmonary/Chest: Effort normal and breath sounds normal. No respiratory distress.  Musculoskeletal: Normal range of motion.  Lymphadenopathy:    He has no cervical adenopathy.  Neurological: He is alert and oriented to person, place, and time. He exhibits normal muscle tone. Coordination normal.  Skin: Skin is warm and dry.    ED Course  Procedures (including critical care time) Labs Review Labs Reviewed - No data to display Imaging Review No results found.  EKG Interpretation   None       MDM   1. Pain, dental    Patient with localized ttp of right upper molar.  No concerning sx's for Ludwig's angina.  No drainable abscess at this time.  Pt agrees to close dental f/u.  Will treat with amoxil and ultram.     VSS.  Pt stable for discharge.      Venancio Chenier L. Trisha Mangle, PA-C 04/03/13 1940

## 2013-04-03 NOTE — ED Notes (Signed)
Pt c/o toothache on rt side.

## 2013-04-03 NOTE — ED Notes (Signed)
Pt states he took 2 shots of alcohol for pain prior to arrival.

## 2013-04-03 NOTE — ED Notes (Signed)
Pt reports dental issues that woke him last night & pain has not let up. Had an appointment that was canceled, has not been rescheduled. Dental carries noted to the upper right side. Pt has the smell of ETOH on breath.

## 2013-04-04 NOTE — ED Provider Notes (Signed)
Medical screening examination/treatment/procedure(s) were performed by non-physician practitioner and as supervising physician I was immediately available for consultation/collaboration.  EKG Interpretation   None         Latavion Halls B. Bernette Mayers, MD 04/04/13 1359

## 2013-07-01 ENCOUNTER — Encounter (HOSPITAL_COMMUNITY): Payer: Self-pay | Admitting: Emergency Medicine

## 2013-07-01 ENCOUNTER — Emergency Department (HOSPITAL_COMMUNITY)
Admission: EM | Admit: 2013-07-01 | Discharge: 2013-07-01 | Disposition: A | Discharge status: Home / Self Care | Payer: Self-pay | Attending: Emergency Medicine | Admitting: Emergency Medicine

## 2013-07-01 DIAGNOSIS — K2970 Gastritis, unspecified, without bleeding: Secondary | ICD-10-CM

## 2013-07-01 DIAGNOSIS — F329 Major depressive disorder, single episode, unspecified: Secondary | ICD-10-CM | POA: Insufficient documentation

## 2013-07-01 DIAGNOSIS — F17200 Nicotine dependence, unspecified, uncomplicated: Secondary | ICD-10-CM | POA: Insufficient documentation

## 2013-07-01 DIAGNOSIS — F3289 Other specified depressive episodes: Secondary | ICD-10-CM | POA: Insufficient documentation

## 2013-07-01 DIAGNOSIS — K2990 Gastroduodenitis, unspecified, without bleeding: Principal | ICD-10-CM

## 2013-07-01 DIAGNOSIS — Z792 Long term (current) use of antibiotics: Secondary | ICD-10-CM | POA: Insufficient documentation

## 2013-07-01 DIAGNOSIS — Z79899 Other long term (current) drug therapy: Secondary | ICD-10-CM | POA: Insufficient documentation

## 2013-07-01 LAB — COMPREHENSIVE METABOLIC PANEL
ALT: 15 U/L (ref 0–53)
AST: 15 U/L (ref 0–37)
Albumin: 3.3 g/dL — ABNORMAL LOW (ref 3.5–5.2)
Alkaline Phosphatase: 83 U/L (ref 39–117)
BUN: 8 mg/dL (ref 6–23)
CALCIUM: 8.9 mg/dL (ref 8.4–10.5)
CO2: 25 mEq/L (ref 19–32)
CREATININE: 0.64 mg/dL (ref 0.50–1.35)
Chloride: 101 mEq/L (ref 96–112)
GFR calc non Af Amer: >90 mL/min (ref >90)
GLUCOSE: 149 mg/dL — AB (ref 70–99)
Potassium: 3.9 mEq/L (ref 3.7–5.3)
SODIUM: 137 meq/L (ref 137–147)
TOTAL PROTEIN: 7.1 g/dL (ref 6.0–8.3)
Total Bilirubin: 0.3 mg/dL (ref 0.3–1.2)

## 2013-07-01 LAB — CBC WITH DIFFERENTIAL/PLATELET
BASOS ABS: 0 10*3/uL (ref 0.0–0.1)
Basophils Relative: 0 % (ref 0–1)
EOS ABS: 0.3 10*3/uL (ref 0.0–0.7)
EOS PCT: 2 % (ref 0–5)
HCT: 44.6 % (ref 39.0–52.0)
Hemoglobin: 14.7 g/dL (ref 13.0–17.0)
LYMPHS ABS: 1.6 10*3/uL (ref 0.7–4.0)
Lymphocytes Relative: 13 % (ref 12–46)
MCH: 27.6 pg (ref 26.0–34.0)
MCHC: 33 g/dL (ref 30.0–36.0)
MCV: 83.7 fL (ref 78.0–100.0)
MONO ABS: 0.5 10*3/uL (ref 0.1–1.0)
Monocytes Relative: 4 % (ref 3–12)
Neutro Abs: 9.6 10*3/uL — ABNORMAL HIGH (ref 1.7–7.7)
Neutrophils Relative %: 81 % — ABNORMAL HIGH (ref 43–77)
PLATELETS: 246 10*3/uL (ref 150–400)
RBC: 5.33 MIL/uL (ref 4.22–5.81)
RDW: 14.4 % (ref 11.5–15.5)
WBC: 11.9 10*3/uL — ABNORMAL HIGH (ref 4.0–10.5)

## 2013-07-01 LAB — OCCULT BLOOD, POC DEVICE: FECAL OCCULT BLD: NEGATIVE

## 2013-07-01 LAB — LIPASE, BLOOD: Lipase: 23 U/L (ref 11–59)

## 2013-07-01 MED ORDER — SUCRALFATE 1 G PO TABS
1.0000 g | ORAL_TABLET | Freq: Three times a day (TID) | ORAL | Status: DC
Start: 2013-07-01 — End: 2013-07-01
  Administered 2013-07-01: 1 g via ORAL
  Filled 2013-07-01 (×6): qty 1

## 2013-07-01 MED ORDER — OMEPRAZOLE 40 MG PO CPDR: 40.0000 mg | DELAYED_RELEASE_CAPSULE | Freq: Every day | ORAL | Status: DC

## 2013-07-01 MED ORDER — PANTOPRAZOLE SODIUM 40 MG IV SOLR
40.0000 mg | Freq: Once | INTRAVENOUS | Status: AC
  Administered 2013-07-01: 40 mg via INTRAVENOUS
  Filled 2013-07-01: qty 40

## 2013-07-01 MED ORDER — SUCRALFATE 1 G PO TABS
ORAL_TABLET | ORAL | Status: AC
  Filled 2013-07-01: qty 1

## 2013-07-01 MED ORDER — SUCRALFATE 1 G PO TABS: 1.0000 g | ORAL_TABLET | Freq: Three times a day (TID) | ORAL | Status: DC

## 2013-07-01 MED ORDER — SODIUM CHLORIDE 0.9 % IV SOLN
INTRAVENOUS | Status: DC
  Administered 2013-07-01: 07:00:00 via INTRAVENOUS

## 2013-07-01 NOTE — Discharge Instructions (Signed)

## 2013-07-01 NOTE — ED Notes (Signed)
Patient reports woke up at 0300 this morning with upper abdominal pain and hematemesis.

## 2013-07-01 NOTE — ED Provider Notes (Signed)
CSN: [id number]     Arrival date & time 07/01/13  4696 History   First MD Initiated Contact with Patient 07/01/13 0615     Chief Complaint  Patient presents with  . Abdominal Pain   (Consider location/radiation/quality/duration/timing/severity/associated sxs/prior Treatment) HPI This is a 28 year old male awoke this morning at 3 AM with moderate to severe epigastric pain. He describes the pain as a burning. It is worse lying on either side and better when lying supine. It has been associated with 2 episodes of vomiting which he describes as bloody. He has not had any melena. He has not had any diarrhea. He had a similar episode of pain last year. A CT scan and lab work were unremarkable and it was suspected he had gastritis or peptic ulcer disease. He was placed on Pepcid at that time.  Past Medical History  Diagnosis Date  . Bipolar 1 disorder   . Depression   . Sleep disorder    History reviewed. No pertinent past surgical history. History reviewed. No pertinent family history. History  Substance Use Topics  . Smoking status: Current Every Day Smoker -- 1.00 packs/day for 10 years    Types: Cigarettes  . Smokeless tobacco: Not on file  . Alcohol Use: Yes     Comment: Occassionally     Review of Systems  All other systems reviewed and are negative.    Allergies  Review of patient's allergies indicates no known allergies.  Home Medications   Current Outpatient Rx  Name  Route  Sig  Dispense  Refill  . amoxicillin (AMOXIL) 500 MG capsule   Oral   Take 1 capsule (500 mg total) by mouth 3 (three) times daily.   21 capsule   0   . Aspirin-Acetaminophen-Caffeine (GOODY HEADACHE PO)   Oral   Take 1 packet by mouth daily as needed.         . famotidine (PEPCID) 20 MG tablet   Oral   Take 1 tablet (20 mg total) by mouth 2 (two) times daily.   30 tablet   0   . HYDROcodone-acetaminophen (NORCO/VICODIN) 5-325 MG per tablet   Oral   Take 1-2 tablets by mouth every 6  (six) hours as needed for pain.   14 tablet   0   . promethazine (PHENERGAN) 25 MG tablet   Oral   Take 1 tablet (25 mg total) by mouth every 6 (six) hours as needed for nausea.   12 tablet   0   . traMADol (ULTRAM) 50 MG tablet   Oral   Take 1 tablet (50 mg total) by mouth every 6 (six) hours as needed for pain.   15 tablet   0   . trazodone (DESYREL) 300 MG tablet   Oral   Take 300 mg by mouth at bedtime.          BP 146/79  Pulse 72  Temp(Src) 98.4 F (36.9 C) (Oral)  Resp 22  Ht 6\' 2"  (1.88 m)  Wt 285 lb (129.275 kg)  BMI 36.58 kg/m2  SpO2 97%  Physical Exam General: Well-developed, obese male in no acute distress; appearance consistent with age of record HENT: normocephalic; atraumatic Eyes: pupils equal, round and reactive to light; extraocular muscles intact Neck: supple Heart: regular rate and rhythm Lungs: clear to auscultation bilaterally Abdomen: soft; obese; epigastric tenderness; bowel sounds present; gallbladder visualized on bedside ultrasound with negative sonographic Murphy's sign Rectal: Normal sphincter tone; no formed stool in vault; stool on examining  below brown and heme-negative Extremities: No deformity; full range of motion; pulses normal Neurologic: Awake, alert and oriented; motor function intact in all extremities and symmetric; no facial droop Skin: Warm and dry; acne, primarily of the buttocks Psychiatric: Normal mood and affect    ED Course  Procedures (including critical care time)   MDM   Nursing notes and vitals signs, including pulse oximetry, reviewed.  Summary of this visit's results, reviewed by myself:  Labs:  Results for orders placed during the hospital encounter of 07/01/13 (from the past 24 hour(s))  CBC WITH DIFFERENTIAL     Status: Abnormal   Collection Time    07/01/13  6:38 AM      Result Value Range   WBC 11.9 (*) 4.0 - 10.5 K/uL   RBC 5.33  4.22 - 5.81 MIL/uL   Hemoglobin 14.7  13.0 - 17.0 g/dL   HCT  16.144.6  09.639.0 - 04.552.0 %   MCV 83.7  78.0 - 100.0 fL   MCH 27.6  26.0 - 34.0 pg   MCHC 33.0  30.0 - 36.0 g/dL   RDW 40.914.4  81.111.5 - 91.415.5 %   Platelets 246  150 - 400 K/uL   Neutrophils Relative % 81 (*) 43 - 77 %   Neutro Abs 9.6 (*) 1.7 - 7.7 K/uL   Lymphocytes Relative 13  12 - 46 %   Lymphs Abs 1.6  0.7 - 4.0 K/uL   Monocytes Relative 4  3 - 12 %   Monocytes Absolute 0.5  0.1 - 1.0 K/uL   Eosinophils Relative 2  0 - 5 %   Eosinophils Absolute 0.3  0.0 - 0.7 K/uL   Basophils Relative 0  0 - 1 %   Basophils Absolute 0.0  0.0 - 0.1 K/uL  COMPREHENSIVE METABOLIC PANEL     Status: Abnormal   Collection Time    07/01/13  6:38 AM      Result Value Range   Sodium 137  137 - 147 mEq/L   Potassium 3.9  3.7 - 5.3 mEq/L   Chloride 101  96 - 112 mEq/L   CO2 25  19 - 32 mEq/L   Glucose, Bld 149 (*) 70 - 99 mg/dL   BUN 8  6 - 23 mg/dL   Creatinine, Ser 7.820.64  0.50 - 1.35 mg/dL   Calcium 8.9  8.4 - 95.610.5 mg/dL   Total Protein 7.1  6.0 - 8.3 g/dL   Albumin 3.3 (*) 3.5 - 5.2 g/dL   AST 15  0 - 37 U/L   ALT 15  0 - 53 U/L   Alkaline Phosphatase 83  39 - 117 U/L   Total Bilirubin 0.3  0.3 - 1.2 mg/dL   GFR calc non Af Amer >90  >90 mL/min   GFR calc Af Amer >90  >90 mL/min  LIPASE, BLOOD     Status: None   Collection Time    07/01/13  6:38 AM      Result Value Range   Lipase 23  11 - 59 U/L   7:35 AM Significant relief of symptoms after Carafate. We will start him on a proton pump inhibitor and regular Carafate and refer to gastroenterology. He was advised to return for worsening symptoms such as recurrent hematemesis, melena or uncontrolled pain. He is requesting referral to a gastroenterologist.     Hanley SeamenJohn L Sopheap Basic, MD 07/01/13 [phone]

## 2013-11-09 ENCOUNTER — Encounter (HOSPITAL_COMMUNITY): Payer: Self-pay | Admitting: Emergency Medicine

## 2013-11-09 ENCOUNTER — Emergency Department (HOSPITAL_COMMUNITY)
Admission: EM | Admit: 2013-11-09 | Discharge: 2013-11-09 | Disposition: A | Discharge status: Home / Self Care | Payer: Self-pay | Attending: Emergency Medicine | Admitting: Emergency Medicine

## 2013-11-09 DIAGNOSIS — Z8659 Personal history of other mental and behavioral disorders: Secondary | ICD-10-CM | POA: Insufficient documentation

## 2013-11-09 DIAGNOSIS — F17200 Nicotine dependence, unspecified, uncomplicated: Secondary | ICD-10-CM | POA: Insufficient documentation

## 2013-11-09 DIAGNOSIS — K044 Acute apical periodontitis of pulpal origin: Secondary | ICD-10-CM | POA: Insufficient documentation

## 2013-11-09 DIAGNOSIS — F329 Major depressive disorder, single episode, unspecified: Secondary | ICD-10-CM | POA: Insufficient documentation

## 2013-11-09 DIAGNOSIS — K029 Dental caries, unspecified: Secondary | ICD-10-CM

## 2013-11-09 DIAGNOSIS — K047 Periapical abscess without sinus: Secondary | ICD-10-CM

## 2013-11-09 DIAGNOSIS — K055 Other periodontal diseases: Secondary | ICD-10-CM | POA: Insufficient documentation

## 2013-11-09 DIAGNOSIS — F3289 Other specified depressive episodes: Secondary | ICD-10-CM | POA: Insufficient documentation

## 2013-11-09 DIAGNOSIS — Z79899 Other long term (current) drug therapy: Secondary | ICD-10-CM | POA: Insufficient documentation

## 2013-11-09 DIAGNOSIS — K08109 Complete loss of teeth, unspecified cause, unspecified class: Secondary | ICD-10-CM | POA: Insufficient documentation

## 2013-11-09 MED ORDER — TRAMADOL HCL 50 MG PO TABS: 50.0000 mg | ORAL_TABLET | Freq: Four times a day (QID) | ORAL | Status: DC | PRN

## 2013-11-09 MED ORDER — AMOXICILLIN 500 MG PO CAPS: 500.0000 mg | ORAL_CAPSULE | Freq: Three times a day (TID) | ORAL | Status: AC

## 2013-11-09 NOTE — Discharge Instructions (Signed)
Dental Caries  Dental caries (also called tooth decay) is the most common oral disease. It can occur at any age, but is more common in children and young adults.  HOW DENTAL CARIES DEVELOPS  The process of decay begins when bacteria and foods (particularly sugars and starches) combine in your mouth to produce plaque. Plaque is a substance that sticks to the hard, outer surface of a tooth (enamel). The bacteria in plaque produce acids that attack enamel. These acids may also attack the root surface of a tooth (cementum) if it is exposed. Repeated attacks dissolve these surfaces and create holes in the tooth (cavities). If left untreated, the acids destroy the other layers of the tooth.  RISK FACTORS  Frequent sipping of sugary beverages.   Frequent snacking on sugary and starchy foods, especially those that easily get stuck in the teeth.   Poor oral hygiene.   Dry mouth.   Substance abuse such as methamphetamine abuse.   Broken or poor-fitting dental restorations.   Eating disorders.   Gastroesophageal reflux disease (GERD).   Certain radiation treatments to the head and neck. SYMPTOMS In the early stages of dental caries, symptoms are seldom present. Sometimes white, chalky areas may be seen on the enamel or other tooth layers. In later stages, symptoms may include:  Pits and holes on the enamel.  Toothache after sweet, hot, or cold foods or drinks are consumed.  Pain around the tooth.  Swelling around the tooth. DIAGNOSIS  Most of the time, dental caries is detected during a regular dental checkup. A diagnosis is made after a thorough medical and dental history is taken and the surfaces of your teeth are checked for signs of dental caries. Sometimes special instruments, such as lasers, are used to check for dental caries. Dental X-ray exams may be taken so that areas not visible to the eye (such as between the contact areas of the teeth) can be checked for cavities.    TREATMENT  If dental caries is in its early stages, it may be reversed with a fluoride treatment or an application of a remineralizing agent at the dental office. Thorough brushing and flossing at home is needed to aid these treatments. If it is in its later stages, treatment depends on the location and extent of tooth destruction:   If a small area of the tooth has been destroyed, the destroyed area will be removed and cavities will be filled with a material such as gold, silver amalgam, or composite resin.   If a large area of the tooth has been destroyed, the destroyed area will be removed and a cap (crown) will be fitted over the remaining tooth structure.   If the center part of the tooth (pulp) is affected, a procedure called a root canal will be needed before a filling or crown can be placed.   If most of the tooth has been destroyed, the tooth may need to be pulled (extracted). HOME CARE INSTRUCTIONS You can prevent, stop, or reverse dental caries at home by practicing good oral hygiene. Good oral hygiene includes:  Thoroughly cleaning your teeth at least twice a day with a toothbrush and dental floss.   Using a fluoride toothpaste. A fluoride mouth rinse may also be used if recommended by your dentist or health care provider.   Restricting the amount of sugary and starchy foods and sugary liquids you consume.   Avoiding frequent snacking on these foods and sipping of these liquids.   Keeping regular visits  with a dentist for checkups and cleanings. PREVENTION   Practice good oral hygiene.  Consider a dental sealant. A dental sealant is a coating material that is applied by your dentist to the pits and grooves of teeth. The sealant prevents food from being trapped in them. It may protect the teeth for several years.  Ask about fluoride supplements if you live in a community without fluorinated water or with water that has a low fluoride content. Use fluoride supplements  as directed by your dentist or health care provider.  Allow fluoride varnish applications to teeth if directed by your dentist or health care provider. Document Released: 02/17/2002 Document Revised: 01/28/2013 Document Reviewed: 05/30/2012 [facility] Patient Information 2014 [place], [place].   Complete your entire course of antibiotics as prescribed.  You  may use the tramadol for pain relief but do not drive within 4 hours of taking as this will make you drowsy.  Avoid applying heat or ice to this abscess area which can worsen your symptoms.  You may use warm salt water swish and spit treatment or half peroxide and water swish and spit after meals to keep this area clean as discussed.  Call the dentist listed above for further management of your symptoms.

## 2013-11-09 NOTE — ED Notes (Signed)
Pt with left lower dental pain since last night, pt denies having a dentist, swelling noted to left face since this morning, pt denies fever

## 2013-11-09 NOTE — ED Notes (Signed)
Patient with no complaints at this time. Respirations even and unlabored. Skin warm/dry. Discharge instructions reviewed with patient at this time. Patient given opportunity to voice concerns/ask questions. Patient discharged at this time and left Emergency Department with steady gait.   

## 2013-11-09 NOTE — ED Provider Notes (Signed)
CSN: [id number]     Arrival date & time 11/09/13  1525 History   This chart was scribed for non-physician practitioner Burgess Amor, PA-C, working with Hurman Horn, MD, by Yevette Edwards, ED Scribe. This patient was seen in room APFT22/APFT22 and the patient's care was started at 4:15 PM.  First MD Initiated Contact with Patient 11/09/13 1606     Chief Complaint  Patient presents with  . Dental Pain    The history is provided by the patient. No language interpreter was used.   HPI Comments: Ian Gonzalez is a 28 y.o. male who presents to the Emergency Department complaining of left, lower dental pain which began yesterday evening and has been associated with left-sided facial swelling. The pt denies otalgia, chills, and a fever; in the ED his temperature is 98.6 F. He also denies known injuries to his tooth. He used 600 mg IBU yesterday evening,and two Tylenol with minimal relief. He was treated eight months ago for dental pain on the left side as well. He does not have dentist or insurance. [name] is a current smoker.   Past Medical History  Diagnosis Date  . Bipolar 1 disorder   . Depression   . Sleep disorder    History reviewed. No pertinent past surgical history. History reviewed. No pertinent family history. History  Substance Use Topics  . Smoking status: Current Every Day Smoker -- 1.00 packs/day for 10 years    Types: Cigarettes  . Smokeless tobacco: Not on file  . Alcohol Use: Yes     Comment: Occassionally     Review of Systems  Constitutional: Negative for fever and chills.  HENT: Positive for dental problem and facial swelling. Negative for congestion, ear pain and sore throat.   Eyes: Negative.   Respiratory: Negative for chest tightness and shortness of breath.   Cardiovascular: Negative for chest pain.  Gastrointestinal: Negative for nausea and abdominal pain.  Genitourinary: Negative.   Musculoskeletal: Negative for arthralgias, joint swelling and neck pain.   Skin: Negative.  Negative for rash and wound.  Neurological: Negative for dizziness, weakness, light-headedness, numbness and headaches.  Psychiatric/Behavioral: Negative.   All other systems reviewed and are negative.   Allergies  Review of patient's allergies indicates no known allergies.  Home Medications   Prior to Admission medications   Medication Sig Start Date End Date Taking? Authorizing Provider  amoxicillin (AMOXIL) 500 MG capsule Take 1 capsule (500 mg total) by mouth 3 (three) times daily. 11/09/13 11/19/13  Burgess Amor, PA-C  traMADol (ULTRAM) 50 MG tablet Take 1 tablet (50 mg total) by mouth every 6 (six) hours as needed. 11/09/13   Burgess Amor, PA-C   Triage Vitals: BP 143/76  Pulse 90  Temp(Src) 98.6 F (37 C) (Oral)  Resp 16  Ht 6\' 2"  (1.88 m)  Wt 285 lb (129.275 kg)  BMI 36.58 kg/m2  SpO2 95%  Physical Exam  Constitutional: He is oriented to person, place, and time. He appears well-developed and well-nourished. No distress.  HENT:  Head: Normocephalic and atraumatic.  Right Ear: Tympanic membrane and external ear normal. Tympanic membrane is not erythematous, not retracted and not bulging.  Left Ear: Tympanic membrane and external ear normal. Tympanic membrane is not erythematous, not retracted and not bulging.  Mouth/Throat: Oropharynx is clear and moist and mucous membranes are normal. No oral lesions.  Generalized poor dentition along upper left and lower teeth. Several missing teeth. Left upper second molar, there is a large cavity  with missing enamel. He has surrounding gingival erythema with no palpable abscess. Mild generalized left cheek edema also without palpable abscess and induration. Left submandibular node is tender without enlargement.   Eyes: Conjunctivae are normal.  Neck: Normal range of motion. Neck supple.  Cardiovascular: Normal rate and normal heart sounds.   Pulmonary/Chest: Effort normal.  Abdominal: He exhibits no distension.   Musculoskeletal: Normal range of motion.  Lymphadenopathy:    He has no cervical adenopathy.  Neurological: He is alert and oriented to person, place, and time.  Skin: Skin is warm and dry. No erythema.  Psychiatric: He has a normal mood and affect.    ED Course  Procedures (including critical care time)  DIAGNOSTIC STUDIES: Oxygen Saturation is 95% on room air, normal by my interpretation.    COORDINATION OF CARE:  4:20 PM- Discussed treatment plan with patient, and the patient agreed to the plan. The plan includes an antibiotic. Advised the pt not to use ice, but encouraged him to use a topical pain reliever. Also provided the pt dental referrals.   Labs Review Labs Reviewed - No data to display  Imaging Review No results found.   EKG Interpretation None      MDM   Final diagnoses:  Dental infection  Dental cavity    Prescribed amoxil, tramadol. Dental referrals given.    I personally performed the services described in this documentation, which was scribed in my presence. The recorded information has been reviewed and is accurate.    Burgess AmorJulie Jaylan Duggar, PA-C 11/09/13 1646

## 2013-11-12 NOTE — ED Provider Notes (Signed)
Medical screening examination/treatment/procedure(s) were performed by non-physician practitioner and as supervising physician I was immediately available for consultation/collaboration.   EKG Interpretation None       Hurman Horn, MD 11/12/13 [phone]

## 2014-05-27 ENCOUNTER — Ambulatory Visit (HOSPITAL_COMMUNITY): Admit: 2014-05-27 | Payer: Self-pay

## 2014-05-27 ENCOUNTER — Encounter (HOSPITAL_COMMUNITY): Payer: Self-pay | Admitting: Emergency Medicine

## 2014-05-27 ENCOUNTER — Emergency Department (HOSPITAL_COMMUNITY)
Admission: EM | Admit: 2014-05-27 | Discharge: 2014-05-27 | Disposition: A | Discharge status: Home / Self Care | Payer: Self-pay | Attending: Emergency Medicine | Admitting: Emergency Medicine

## 2014-05-27 DIAGNOSIS — Z79899 Other long term (current) drug therapy: Secondary | ICD-10-CM | POA: Insufficient documentation

## 2014-05-27 DIAGNOSIS — F329 Major depressive disorder, single episode, unspecified: Secondary | ICD-10-CM | POA: Insufficient documentation

## 2014-05-27 DIAGNOSIS — R1011 Right upper quadrant pain: Secondary | ICD-10-CM | POA: Insufficient documentation

## 2014-05-27 DIAGNOSIS — Z7982 Long term (current) use of aspirin: Secondary | ICD-10-CM | POA: Insufficient documentation

## 2014-05-27 DIAGNOSIS — Z8669 Personal history of other diseases of the nervous system and sense organs: Secondary | ICD-10-CM | POA: Insufficient documentation

## 2014-05-27 DIAGNOSIS — Z720 Tobacco use: Secondary | ICD-10-CM | POA: Insufficient documentation

## 2014-05-27 LAB — COMPREHENSIVE METABOLIC PANEL
ALBUMIN: 3.3 g/dL — AB (ref 3.5–5.2)
ALT: 11 U/L (ref 0–53)
AST: 13 U/L (ref 0–37)
Alkaline Phosphatase: 76 U/L (ref 39–117)
Anion gap: 11 (ref 5–15)
BUN: 7 mg/dL (ref 6–23)
CALCIUM: 9.1 mg/dL (ref 8.4–10.5)
CO2: 25 meq/L (ref 19–32)
CREATININE: 0.79 mg/dL (ref 0.50–1.35)
Chloride: 102 mEq/L (ref 96–112)
GFR calc Af Amer: >90 mL/min (ref >90)
GFR calc non Af Amer: >90 mL/min (ref >90)
Glucose, Bld: 137 mg/dL — ABNORMAL HIGH (ref 70–99)
Potassium: 4.1 mEq/L (ref 3.7–5.3)
SODIUM: 138 meq/L (ref 137–147)
TOTAL PROTEIN: 7.1 g/dL (ref 6.0–8.3)
Total Bilirubin: 0.2 mg/dL — ABNORMAL LOW (ref 0.3–1.2)

## 2014-05-27 LAB — URINALYSIS, ROUTINE W REFLEX MICROSCOPIC
Bilirubin Urine: NEGATIVE
GLUCOSE, UA: NEGATIVE mg/dL
Hgb urine dipstick: NEGATIVE
KETONES UR: NEGATIVE mg/dL
LEUKOCYTES UA: NEGATIVE
NITRITE: NEGATIVE
PROTEIN: NEGATIVE mg/dL
Specific Gravity, Urine: >1.03 — ABNORMAL HIGH (ref 1.005–1.030)
UROBILINOGEN UA: 0.2 mg/dL (ref 0.0–1.0)
pH: 5.5 (ref 5.0–8.0)

## 2014-05-27 LAB — CBC WITH DIFFERENTIAL/PLATELET
BASOS ABS: 0 10*3/uL (ref 0.0–0.1)
Basophils Relative: 0 % (ref 0–1)
EOS PCT: 4 % (ref 0–5)
Eosinophils Absolute: 0.5 10*3/uL (ref 0.0–0.7)
HEMATOCRIT: 44.8 % (ref 39.0–52.0)
HEMOGLOBIN: 14.4 g/dL (ref 13.0–17.0)
LYMPHS ABS: 2.6 10*3/uL (ref 0.7–4.0)
LYMPHS PCT: 19 % (ref 12–46)
MCH: 26.8 pg (ref 26.0–34.0)
MCHC: 32.1 g/dL (ref 30.0–36.0)
MCV: 83.3 fL (ref 78.0–100.0)
MONO ABS: 0.7 10*3/uL (ref 0.1–1.0)
Monocytes Relative: 5 % (ref 3–12)
NEUTROS ABS: 9.9 10*3/uL — AB (ref 1.7–7.7)
Neutrophils Relative %: 72 % (ref 43–77)
Platelets: 272 10*3/uL (ref 150–400)
RBC: 5.38 MIL/uL (ref 4.22–5.81)
RDW: 14.4 % (ref 11.5–15.5)
WBC: 13.6 10*3/uL — AB (ref 4.0–10.5)

## 2014-05-27 LAB — LIPASE, BLOOD: LIPASE: 21 U/L (ref 11–59)

## 2014-05-27 MED ORDER — PANTOPRAZOLE SODIUM 40 MG IV SOLR
40.0000 mg | Freq: Once | INTRAVENOUS | Status: AC
  Administered 2014-05-27: 40 mg via INTRAVENOUS
  Filled 2014-05-27: qty 40

## 2014-05-27 MED ORDER — ONDANSETRON HCL 4 MG/2ML IJ SOLN
4.0000 mg | Freq: Once | INTRAMUSCULAR | Status: AC
  Administered 2014-05-27: 4 mg via INTRAVENOUS
  Filled 2014-05-27: qty 2

## 2014-05-27 MED ORDER — FENTANYL CITRATE 0.05 MG/ML IJ SOLN
100.0000 ug | Freq: Once | INTRAMUSCULAR | Status: AC
  Administered 2014-05-27: 100 ug via INTRAVENOUS
  Filled 2014-05-27: qty 2

## 2014-05-27 NOTE — ED Notes (Signed)
Pt c/o rt abd pain x 2 hours. Pt states he has vomited twice.

## 2014-05-27 NOTE — ED Notes (Signed)
Informed pt to be here for ultrasound today at 12 noon and to not eat or drink after 6am. Pt verbalized understanding of directions.

## 2014-05-27 NOTE — ED Provider Notes (Signed)
CSN: [id number]     Arrival date & time 05/27/14  0004 History  This chart was scribed for Ian SeamenJohn L Jamaar Howes, MD by Annye AsaAnna Dorsett, ED Scribe. This patient was seen in room APA09/APA09 and the patient's care was started at 12:40 AM.      Chief Complaint  Patient presents with  . Abdominal Pain   Patient is a 28 y.o. male presenting with abdominal pain. The history is provided by the patient. No language interpreter was used.  Abdominal Pain    HPI Comments: Nada Boozerdward M Gonzalez is a 28 y.o. male who presents to the Emergency Department complaining of 2 hours of constant abdominal pain, localized in his epigastrium and RUQ radiating around to his right flank. He also reports 2 episodes of vomiting. Patient reports he ate dinner as normal earlier tonight (meal consisted of pizza) and felt at first as though he had overeaten. He had a BM but his pain continued. His pain is worse with breathing but improves with standing. He denies diarrhea or fever.   Past Medical History  Diagnosis Date  . Bipolar 1 disorder   . Depression   . Sleep disorder    History reviewed. No pertinent past surgical history. History reviewed. No pertinent family history. History  Substance Use Topics  . Smoking status: Current Every Day Smoker -- 1.00 packs/day for 10 years    Types: Cigarettes  . Smokeless tobacco: Not on file  . Alcohol Use: Yes     Comment: Occassionally     Review of Systems  Gastrointestinal: Positive for abdominal pain.    A complete 10 system review of systems was obtained and all systems are negative except as noted in the HPI and PMH.   Allergies  Review of patient's allergies indicates no known allergies.  Home Medications   Prior to Admission medications   Medication Sig Start Date End Date Taking? Authorizing Provider  Aspirin-Acetaminophen-Caffeine (GOODY HEADACHE PO) Take 1 packet by mouth daily as needed.    Historical Provider, MD  famotidine (PEPCID) 20 MG tablet Take 1 tablet  (20 mg total) by mouth 2 (two) times daily. 01/24/13   Vanetta MuldersScott Zackowski, MD  omeprazole (PRILOSEC) 40 MG capsule Take 1 capsule (40 mg total) by mouth daily. 07/01/13   Carlisle BeersJohn L Jayley Hustead, MD  promethazine (PHENERGAN) 25 MG tablet Take 1 tablet (25 mg total) by mouth every 6 (six) hours as needed for nausea. 01/24/13   Vanetta MuldersScott Zackowski, MD  sucralfate (CARAFATE) 1 G tablet Take 1 tablet (1 g total) by mouth 4 (four) times daily -  with meals and at bedtime. 07/01/13   Carlisle BeersJohn L Brittiany Wiehe, MD  traMADol (ULTRAM) 50 MG tablet Take 1 tablet (50 mg total) by mouth every 6 (six) hours as needed for pain. 04/03/13   Tammy L. Triplett, PA-C  traMADol (ULTRAM) 50 MG tablet Take 1 tablet (50 mg total) by mouth every 6 (six) hours as needed. 11/09/13   Burgess AmorJulie Idol, PA-C  trazodone (DESYREL) 300 MG tablet Take 300 mg by mouth at bedtime.    Historical Provider, MD   BP 137/70 mmHg  Pulse 89  Temp(Src) 97.9 F (36.6 C)  Resp 17  Ht 6\' 2"  (1.88 m)  Wt 285 lb (129.275 kg)  BMI 36.58 kg/m2  SpO2 100%   Physical Exam  Nursing note and vitals reviewed.  General: Well-developed, obese male in no acute distress; appearance consistent with age of record HENT: normocephalic; atraumatic Eyes: pupils equal, round and reactive to light;  extraocular muscles intact Neck: supple Heart: regular rate and rhythm Lungs: clear to auscultation bilaterally Abdomen: soft; nondistended; nontender; no masses or hepatosplenomegaly; bowel sounds present; epigastric, RUQ and right flank tenderness with positive Murphy's sign Extremities: No deformity; full range of motion; pulses normal Neurologic: Awake, alert and oriented; motor function intact in all extremities and symmetric; no facial droop Skin: Warm and dry; acneiform rash of the abdominal pannus  Psychiatric: Normal mood and affect  ED Course  Procedures   DIAGNOSTIC STUDIES: Oxygen Saturation is 100% on RA, normal by my interpretation.    COORDINATION OF CARE: 12:47 AM  Discussed treatment plan with pt at bedside and pt agreed to plan.  MDM   Nursing notes and vitals signs, including pulse oximetry, reviewed.  Summary of this visit's results, reviewed by myself:  Labs:  Results for orders placed or performed during the hospital encounter of 05/27/14 (from the past 24 hour(s))  Comprehensive metabolic panel     Status: Abnormal   Collection Time: 05/27/14 12:49 AM  Result Value Ref Range   Sodium 138 137 - 147 mEq/L   Potassium 4.1 3.7 - 5.3 mEq/L   Chloride 102 96 - 112 mEq/L   CO2 25 19 - 32 mEq/L   Glucose, Bld 137 (H) 70 - 99 mg/dL   BUN 7 6 - 23 mg/dL   Creatinine, Ser 9.810.79 0.50 - 1.35 mg/dL   Calcium 9.1 8.4 - 19.110.5 mg/dL   Total Protein 7.1 6.0 - 8.3 g/dL   Albumin 3.3 (L) 3.5 - 5.2 g/dL   AST 13 0 - 37 U/L   ALT 11 0 - 53 U/L   Alkaline Phosphatase 76 39 - 117 U/L   Total Bilirubin 0.2 (L) 0.3 - 1.2 mg/dL   GFR calc non Af Amer >90 >90 mL/min   GFR calc Af Amer >90 >90 mL/min   Anion gap 11 5 - 15  Lipase, blood     Status: None   Collection Time: 05/27/14 12:49 AM  Result Value Ref Range   Lipase 21 11 - 59 U/L  CBC with Differential     Status: Abnormal   Collection Time: 05/27/14 12:49 AM  Result Value Ref Range   WBC 13.6 (H) 4.0 - 10.5 K/uL   RBC 5.38 4.22 - 5.81 MIL/uL   Hemoglobin 14.4 13.0 - 17.0 g/dL   HCT 47.844.8 29.539.0 - 62.152.0 %   MCV 83.3 78.0 - 100.0 fL   MCH 26.8 26.0 - 34.0 pg   MCHC 32.1 30.0 - 36.0 g/dL   RDW 30.814.4 65.711.5 - 84.615.5 %   Platelets 272 150 - 400 K/uL   Neutrophils Relative % 72 43 - 77 %   Neutro Abs 9.9 (H) 1.7 - 7.7 K/uL   Lymphocytes Relative 19 12 - 46 %   Lymphs Abs 2.6 0.7 - 4.0 K/uL   Monocytes Relative 5 3 - 12 %   Monocytes Absolute 0.7 0.1 - 1.0 K/uL   Eosinophils Relative 4 0 - 5 %   Eosinophils Absolute 0.5 0.0 - 0.7 K/uL   Basophils Relative 0 0 - 1 %   Basophils Absolute 0.0 0.0 - 0.1 K/uL  Urinalysis, Routine w reflex microscopic     Status: Abnormal   Collection Time: 05/27/14  1:05 AM   Result Value Ref Range   Color, Urine YELLOW YELLOW   APPearance CLEAR CLEAR   Specific Gravity, Urine >1.030 (H) 1.005 - 1.030   pH 5.5 5.0 - 8.0  Glucose, UA NEGATIVE NEGATIVE mg/dL   Hgb urine dipstick NEGATIVE NEGATIVE   Bilirubin Urine NEGATIVE NEGATIVE   Ketones, ur NEGATIVE NEGATIVE mg/dL   Protein, ur NEGATIVE NEGATIVE mg/dL   Urobilinogen, UA 0.2 0.0 - 1.0 mg/dL   Nitrite NEGATIVE NEGATIVE   Leukocytes, UA NEGATIVE NEGATIVE   1:52 AM Pain relieved, abdomen no longer tender. Suspect biliary colic. We'll have patient return for biliary ultrasound later today.  I personally performed the services described in this documentation, which was scribed in my presence. The recorded information has been reviewed and is accurate.   Ian Seamen, MD 05/27/14 [phone]

## 2014-09-09 ENCOUNTER — Emergency Department (HOSPITAL_COMMUNITY)
Admission: EM | Admit: 2014-09-09 | Discharge: 2014-09-09 | Disposition: A | Discharge status: Home / Self Care | Payer: Self-pay | Attending: Emergency Medicine | Admitting: Emergency Medicine

## 2014-09-09 ENCOUNTER — Encounter (HOSPITAL_COMMUNITY): Payer: Self-pay | Admitting: *Deleted

## 2014-09-09 DIAGNOSIS — Z79899 Other long term (current) drug therapy: Secondary | ICD-10-CM | POA: Insufficient documentation

## 2014-09-09 DIAGNOSIS — F329 Major depressive disorder, single episode, unspecified: Secondary | ICD-10-CM | POA: Insufficient documentation

## 2014-09-09 DIAGNOSIS — Z7982 Long term (current) use of aspirin: Secondary | ICD-10-CM | POA: Insufficient documentation

## 2014-09-09 DIAGNOSIS — Z8669 Personal history of other diseases of the nervous system and sense organs: Secondary | ICD-10-CM | POA: Insufficient documentation

## 2014-09-09 DIAGNOSIS — Z720 Tobacco use: Secondary | ICD-10-CM | POA: Insufficient documentation

## 2014-09-09 DIAGNOSIS — B349 Viral infection, unspecified: Secondary | ICD-10-CM | POA: Insufficient documentation

## 2014-09-09 MED ORDER — OSELTAMIVIR PHOSPHATE 75 MG PO CAPS: 75.0000 mg | ORAL_CAPSULE | Freq: Two times a day (BID) | ORAL | Status: DC

## 2014-09-09 MED ORDER — GUAIFENESIN-CODEINE 100-10 MG/5ML PO SYRP: 10.0000 mL | ORAL_SOLUTION | Freq: Three times a day (TID) | ORAL | Status: DC | PRN

## 2014-09-09 NOTE — ED Notes (Signed)
Pt c/o nasal congestion, sore throat and body aches since yesterday; taking Ibuprofen for pain and last dose at 0300-0400 this morning and took 800mg 

## 2014-09-09 NOTE — ED Provider Notes (Signed)
CSN: [id number]     Arrival date & time 09/09/14  1049 History   First MD Initiated Contact with Patient 09/09/14 1056     Chief Complaint  Patient presents with  . Generalized Body Aches     (Consider location/radiation/quality/duration/timing/severity/associated sxs/prior Treatment) HPI  Ian Gonzalez is a 29 y.o. male who presents to the Emergency Department complaining of generalized body aches, sore throat, cough and nasal congestion for one day.  He also reports having possible fever and chills although he has not checked his temperature.  He reports cough is mostly non-productive and worse with lying down.  He has been taking ibuprofen which he states helps with the body aches.  He denies shortness of breath, chest pain, vomiting or diarrhea, and rash.    Past Medical History  Diagnosis Date  . Bipolar 1 disorder   . Depression   . Sleep disorder    History reviewed. No pertinent past surgical history. History reviewed. No pertinent family history. History  Substance Use Topics  . Smoking status: Current Every Day Smoker -- 1.00 packs/day for 10 years    Types: Cigarettes  . Smokeless tobacco: Not on file  . Alcohol Use: Yes     Comment: Occassionally     Review of Systems  Constitutional: Positive for fever and chills. Negative for activity change and appetite change.  HENT: Positive for congestion, rhinorrhea and sore throat. Negative for facial swelling and trouble swallowing.   Eyes: Negative for visual disturbance.  Respiratory: Positive for cough. Negative for chest tightness, shortness of breath, wheezing and stridor.   Gastrointestinal: Negative for nausea, vomiting and abdominal pain.  Genitourinary: Negative for dysuria and flank pain.  Musculoskeletal: Positive for myalgias. Negative for neck pain and neck stiffness.  Skin: Negative.  Negative for rash.  Neurological: Negative for dizziness, weakness, numbness and headaches.  Hematological: Negative for  adenopathy.  Psychiatric/Behavioral: Negative for confusion.  All other systems reviewed and are negative.     Allergies  Review of patient's allergies indicates no known allergies.  Home Medications   Prior to Admission medications   Medication Sig Start Date End Date Taking? Authorizing Provider  Aspirin-Acetaminophen-Caffeine (GOODY HEADACHE PO) Take 1 packet by mouth daily as needed.    Historical Provider, MD  famotidine (PEPCID) 20 MG tablet Take 1 tablet (20 mg total) by mouth 2 (two) times daily. 01/24/13   Vanetta Mulders, MD  omeprazole (PRILOSEC) 40 MG capsule Take 1 capsule (40 mg total) by mouth daily. 07/01/13   [name], MD  promethazine (PHENERGAN) 25 MG tablet Take 1 tablet (25 mg total) by mouth every 6 (six) hours as needed for nausea. 01/24/13   Vanetta Mulders, MD  sucralfate (CARAFATE) 1 G tablet Take 1 tablet (1 g total) by mouth 4 (four) times daily -  with meals and at bedtime. 07/01/13   [name], MD  traMADol (ULTRAM) 50 MG tablet Take 1 tablet (50 mg total) by mouth every 6 (six) hours as needed for pain. 04/03/13   Tammi Dakoda Laventure, PA-C  traMADol (ULTRAM) 50 MG tablet Take 1 tablet (50 mg total) by mouth every 6 (six) hours as needed. 11/09/13   Burgess Amor, PA-C  trazodone (DESYREL) 300 MG tablet Take 300 mg by mouth at bedtime.    Historical Provider, MD   BP 135/83 mmHg  Pulse 107  Temp(Src) 99.2 F (37.3 C) (Oral)  Resp 20  Ht  (1.88 m)  Wt 285 lb (129.275 kg)  BMI 36.58  kg/m2  SpO2 96% Physical Exam  Constitutional: He is oriented to person, place, and time. He appears well-developed and well-nourished. No distress.  HENT:  Head: Normocephalic and atraumatic.  Right Ear: Tympanic membrane and ear canal normal.  Left Ear: Tympanic membrane and ear canal normal.  Nose: Mucosal edema and rhinorrhea present.  Mouth/Throat: Uvula is midline and mucous membranes are normal. No trismus in the jaw. No uvula swelling. Posterior oropharyngeal erythema  present. No oropharyngeal exudate, posterior oropharyngeal edema or tonsillar abscesses.  Eyes: Conjunctivae are normal.  Neck: Normal range of motion and phonation normal. Neck supple. No Brudzinski's sign and no Kernig's sign noted.  Cardiovascular: Normal rate, regular rhythm, normal heart sounds and intact distal pulses.   No murmur heard. Pulmonary/Chest: Effort normal and breath sounds normal. No respiratory distress. He has no wheezes. He has no rales.  Abdominal: Soft. He exhibits no distension. There is no tenderness. There is no rebound and no guarding.  Musculoskeletal: Normal range of motion. He exhibits no edema.  Lymphadenopathy:    He has no cervical adenopathy.  Neurological: He is alert and oriented to person, place, and time. He exhibits normal muscle tone. Coordination normal.  Skin: Skin is warm and dry. No rash noted.  Psychiatric: He has a normal mood and affect. Thought content normal.  Nursing note and vitals reviewed.   ED Course  Procedures (including critical care time) Labs Review Labs Reviewed - No data to display  Imaging Review No results found.   EKG Interpretation None      MDM   Final diagnoses:  Viral infection    Patient is well appearing. Nontoxic. Symptoms likely related to viral process. He agrees to symptomatic treatment and close follow-up with his PMD. Vital stable, mucous membranes moist, no nuchal rigidity.    Severiano Gilbertammi Breeanna Galgano, PA-C 09/10/14 1354  Zadie Rhineonald Wickline, MD 09/11/14 [phone]

## 2014-10-25 ENCOUNTER — Emergency Department (HOSPITAL_COMMUNITY)
Admission: EM | Admit: 2014-10-25 | Discharge: 2014-10-25 | Disposition: A | Discharge status: Home / Self Care | Payer: Self-pay | Attending: Emergency Medicine | Admitting: Emergency Medicine

## 2014-10-25 ENCOUNTER — Encounter (HOSPITAL_COMMUNITY): Payer: Self-pay | Admitting: *Deleted

## 2014-10-25 DIAGNOSIS — Z720 Tobacco use: Secondary | ICD-10-CM | POA: Insufficient documentation

## 2014-10-25 DIAGNOSIS — Z8669 Personal history of other diseases of the nervous system and sense organs: Secondary | ICD-10-CM | POA: Insufficient documentation

## 2014-10-25 DIAGNOSIS — K029 Dental caries, unspecified: Secondary | ICD-10-CM | POA: Insufficient documentation

## 2014-10-25 DIAGNOSIS — F329 Major depressive disorder, single episode, unspecified: Secondary | ICD-10-CM | POA: Insufficient documentation

## 2014-10-25 DIAGNOSIS — Z79899 Other long term (current) drug therapy: Secondary | ICD-10-CM | POA: Insufficient documentation

## 2014-10-25 DIAGNOSIS — K047 Periapical abscess without sinus: Secondary | ICD-10-CM | POA: Insufficient documentation

## 2014-10-25 MED ORDER — AMOXICILLIN 500 MG PO CAPS: 500.0000 mg | ORAL_CAPSULE | Freq: Three times a day (TID) | ORAL | Status: DC

## 2014-10-25 MED ORDER — TRAMADOL HCL 50 MG PO TABS: 50.0000 mg | ORAL_TABLET | Freq: Four times a day (QID) | ORAL | Status: DC | PRN

## 2014-10-25 NOTE — Discharge Instructions (Signed)
Dental Abscess °A dental abscess is a collection of infected fluid (pus) from a bacterial infection in the inner part of the tooth (pulp). It usually occurs at the end of the tooth's root.  °CAUSES  °· Severe tooth decay. °· Trauma to the tooth that allows bacteria to enter into the pulp, such as a broken or chipped tooth. °SYMPTOMS  °· Severe pain in and around the infected tooth. °· Swelling and redness around the abscessed tooth or in the mouth or face. °· Tenderness. °· Pus drainage. °· Bad breath. °· Bitter taste in the mouth. °· Difficulty swallowing. °· Difficulty opening the mouth. °· Nausea. °· Vomiting. °· Chills. °· Swollen neck glands. °DIAGNOSIS  °· A medical and dental history will be taken. °· An examination will be performed by tapping on the abscessed tooth. °· X-rays may be taken of the tooth to identify the abscess. °TREATMENT °The goal of treatment is to eliminate the infection. You may be prescribed antibiotic medicine to stop the infection from spreading. A root canal may be performed to save the tooth. If the tooth cannot be saved, it may be pulled (extracted) and the abscess may be drained.  °HOME CARE INSTRUCTIONS °· Only take over-the-counter or prescription medicines for pain, fever, or discomfort as directed by your caregiver. °· Rinse your mouth (gargle) often with salt water (¼ tsp salt in 8 oz [250 ml] of warm water) to relieve pain or swelling. °· Do not drive after taking pain medicine (narcotics). °· Do not apply heat to the outside of your face. °· Return to your dentist for further treatment as directed. °SEEK MEDICAL CARE IF: °· Your pain is not helped by medicine. °· Your pain is getting worse instead of better. °SEEK IMMEDIATE MEDICAL CARE IF: °· You have a fever or persistent symptoms for more than 2-3 days. °· You have a fever and your symptoms suddenly get worse. °· You have chills or a very bad headache. °· You have problems breathing or swallowing. °· You have trouble  opening your mouth. °· You have swelling in the neck or around the eye. °Document Released: 05/28/2005 Document Revised: 02/20/2012 Document Reviewed: 09/05/2010 °Exit[employer]® Patient Information ©2015 [employer]. This information is not intended to replace advice given to you by your health care provider. Make sure you discuss any questions you have with your health care provider. ° ° °Complete your entire course of antibiotics as prescribed.  You  may use the tramadol for pain relief but do not drive within 4 hours of taking as this will make you drowsy.  Avoid applying heat or ice to this abscess area which can worsen your symptoms.  You may use warm salt water swish and spit treatment or half peroxide and water swish and spit after meals to keep this area clean as discussed.  Call the dentist listed above for further management of your symptoms. ° ° °

## 2014-10-25 NOTE — ED Provider Notes (Signed)
CSN: [id number]     Arrival date & time 10/25/14  1545 History  This chart was scribed for Burgess AmorJulie Alley Neils, PA-C, working with Nelva Nayobert Beaton, MD by Leona CarryG. Clay Sherrill, ED Scribe. The patient was seen in [facility]. The patient's care was started at 5:25 PM.      No chief complaint on file.  HPI HPI Comments: Ian Gonzalez is a 29 y.o. male who presents to the Emergency Department complaining of left lower dental pain beginning three days ago when he felt a popping sensation in his jaw. He reports associated swelling surrounding the third, lower left molar. He also complains of a burning sensation in his left ear beginning last night. Patient states that he has taken ibuprofen with no relief of the pain. He reports that he has taken 8 pills in total today. He [facility], but says that the dentist is unable to see him until he has been prescribed an antibiotic.  Patient does not have a PCP.  Past Medical History  Diagnosis Date  . Bipolar 1 disorder   . Depression   . Sleep disorder    History reviewed. No pertinent past surgical history. History reviewed. No pertinent family history. History  Substance Use Topics  . Smoking status: Current Every Day Smoker -- 1.00 packs/day for 10 years    Types: Cigarettes  . Smokeless tobacco: Not on file  . Alcohol Use: Yes     Comment: Occassionally     Review of Systems  Constitutional: Negative for fever.  HENT: Positive for dental problem. Negative for facial swelling and sore throat.   Respiratory: Negative for shortness of breath.   Musculoskeletal: Negative for neck pain and neck stiffness.      Allergies  Review of patient's allergies indicates no known allergies.  Home Medications   Prior to Admission medications   Medication Sig Start Date End Date Taking? Authorizing Provider  amoxicillin (AMOXIL) 500 MG capsule Take 1 capsule (500 mg total) by mouth 3 (three) times daily. 10/25/14   Burgess AmorJulie Clovia Reine, PA-C   Aspirin-Acetaminophen-Caffeine (GOODY HEADACHE PO) Take 1 packet by mouth daily as needed.    Historical Provider, MD  famotidine (PEPCID) 20 MG tablet Take 1 tablet (20 mg total) by mouth 2 (two) times daily. 01/24/13   Vanetta MuldersScott Zackowski, MD  guaiFENesin-codeine (ROBITUSSIN AC) 100-10 MG/5ML syrup Take 10 mLs by mouth 3 (three) times daily as needed. 09/09/14   Tammy Triplett, PA-C  omeprazole (PRILOSEC) 40 MG capsule Take 1 capsule (40 mg total) by mouth daily. 07/01/13   Paula LibraJohn Molpus, MD  oseltamivir (TAMIFLU) 75 MG capsule Take 1 capsule (75 mg total) by mouth 2 (two) times daily. For 5 days 09/09/14   Tammy Triplett, PA-C  promethazine (PHENERGAN) 25 MG tablet Take 1 tablet (25 mg total) by mouth every 6 (six) hours as needed for nausea. 01/24/13   Vanetta MuldersScott Zackowski, MD  sucralfate (CARAFATE) 1 G tablet Take 1 tablet (1 g total) by mouth 4 (four) times daily -  with meals and at bedtime. 07/01/13   [name], MD  traMADol (ULTRAM) 50 MG tablet Take 1 tablet (50 mg total) by mouth every 6 (six) hours as needed. 10/25/14   Burgess AmorJulie Lynsey Ange, PA-C   Triage Vitals: BP 127/67 mmHg  Pulse 90  Temp(Src) 98.9 F (37.2 C) (Oral)  Resp 18  Ht 6\' 2"  (1.88 m)  Wt 285 lb (129.275 kg)  BMI 36.58 kg/m2  SpO2 97% Physical Exam  Constitutional: He is  oriented to person, place, and time. He appears well-developed and well-nourished. No distress.  HENT:  Head: Normocephalic and atraumatic.  Right Ear: Tympanic membrane and external ear normal.  Left Ear: Tympanic membrane and external ear normal.  Mouth/Throat: Oropharynx is clear and moist and mucous membranes are normal. No oral lesions. No trismus in the jaw. Dental abscesses present.    Eyes: Conjunctivae are normal.  Neck: Normal range of motion. Neck supple.  Cardiovascular: Normal rate and normal heart sounds.   Pulmonary/Chest: Effort normal.  Abdominal: He exhibits no distension.  Musculoskeletal: Normal range of motion.  Lymphadenopathy:    He has no  cervical adenopathy.  Neurological: He is alert and oriented to person, place, and time.  Skin: Skin is warm and dry. No erythema.  Psychiatric: He has a normal mood and affect.    ED Course  Procedures (including critical care time) DIAGNOSTIC STUDIES: Oxygen Saturation is 97% on room air, normal by my interpretation.    COORDINATION OF CARE:    Labs Review Labs Reviewed - No data to display  Imaging Review No results found.   EKG Interpretation None      MDM   Final diagnoses:  Dental abscess    Pt prescribed amoxil.  Tramadol.  Dental referrals given.  Prn f/u.  No trismus, no sign of drainable abscess pocket.  I personally performed the services described in this documentation, which was scribed in my presence. The recorded information has been reviewed and is accurate.    Burgess AmorJulie Nihar Klus, PA-C 10/25/14 1745  Nelva Nayobert Beaton, MD 10/26/14 [phone]

## 2014-10-25 NOTE — ED Notes (Signed)
Alert, NAd,  Pain lt lower jaw, dental pain

## 2014-10-25 NOTE — ED Notes (Addendum)
Lt lower dental pain since Friday

## 2014-10-25 NOTE — ED Notes (Signed)
Pt verbalized understanding of no driving and to use caution within 4 hours of taking pain meds due to meds cause drowsiness 

## 2014-10-31 ENCOUNTER — Emergency Department (HOSPITAL_COMMUNITY)
Admission: EM | Admit: 2014-10-31 | Discharge: 2014-11-01 | Disposition: A | Discharge status: Home / Self Care | Payer: Medicaid Other | Attending: Emergency Medicine | Admitting: Emergency Medicine

## 2014-10-31 ENCOUNTER — Encounter (HOSPITAL_COMMUNITY): Payer: Self-pay | Admitting: Emergency Medicine

## 2014-10-31 DIAGNOSIS — K088 Other specified disorders of teeth and supporting structures: Secondary | ICD-10-CM | POA: Diagnosis not present

## 2014-10-31 DIAGNOSIS — G8929 Other chronic pain: Secondary | ICD-10-CM | POA: Diagnosis not present

## 2014-10-31 DIAGNOSIS — Z720 Tobacco use: Secondary | ICD-10-CM | POA: Diagnosis not present

## 2014-10-31 DIAGNOSIS — F319 Bipolar disorder, unspecified: Secondary | ICD-10-CM | POA: Insufficient documentation

## 2014-10-31 DIAGNOSIS — Z792 Long term (current) use of antibiotics: Secondary | ICD-10-CM | POA: Insufficient documentation

## 2014-10-31 DIAGNOSIS — Z79899 Other long term (current) drug therapy: Secondary | ICD-10-CM | POA: Insufficient documentation

## 2014-10-31 DIAGNOSIS — K029 Dental caries, unspecified: Secondary | ICD-10-CM | POA: Insufficient documentation

## 2014-10-31 DIAGNOSIS — Z8669 Personal history of other diseases of the nervous system and sense organs: Secondary | ICD-10-CM | POA: Insufficient documentation

## 2014-10-31 DIAGNOSIS — K0889 Other specified disorders of teeth and supporting structures: Secondary | ICD-10-CM

## 2014-10-31 NOTE — ED Provider Notes (Signed)
CSN: [id number]     Arrival date & time 10/31/14  2335 History   First MD Initiated Contact with Patient 10/31/14 2350     Chief Complaint  Patient presents with  . Dental Pain     (Consider location/radiation/quality/duration/timing/severity/associated sxs/prior Treatment) Patient is a 29 y.o. male presenting with tooth pain. The history is provided by the patient.  Dental Pain Location:  Lower Lower teeth location:  17/LL 3rd molar Quality:  Throbbing and aching Severity:  Severe Onset quality:  Gradual Duration:  2 weeks Timing:  Intermittent Progression:  Worsening Chronicity:  Chronic Context: dental caries and poor dentition   Relieved by:  Nothing Worsened by:  Nothing tried Ineffective treatments:  NSAIDs Associated symptoms: gum swelling   Associated symptoms: no difficulty swallowing and no trismus   Risk factors: alcohol problem and smoking   Risk factors: no diabetes and no immunosuppression     Past Medical History  Diagnosis Date  . Bipolar 1 disorder   . Depression   . Sleep disorder    History reviewed. No pertinent past surgical history. No family history on file. History  Substance Use Topics  . Smoking status: Current Every Day Smoker -- 1.00 packs/day for 10 years    Types: Cigarettes  . Smokeless tobacco: Not on file  . Alcohol Use: Yes     Comment: Occassionally     Review of Systems  HENT: Positive for dental problem.   Psychiatric/Behavioral:       Depression Bipolar illness  All other systems reviewed and are negative.     Allergies  Review of patient's allergies indicates no known allergies.  Home Medications   Prior to Admission medications   Medication Sig Start Date End Date Taking? Authorizing Provider  amoxicillin (AMOXIL) 500 MG capsule Take 1 capsule (500 mg total) by mouth 3 (three) times daily. 10/25/14   Burgess Amor, PA-C  Aspirin-Acetaminophen-Caffeine (GOODY HEADACHE PO) Take 1 packet by mouth daily as needed.     Historical Provider, MD  famotidine (PEPCID) 20 MG tablet Take 1 tablet (20 mg total) by mouth 2 (two) times daily. 01/24/13   Vanetta Mulders, MD  guaiFENesin-codeine (ROBITUSSIN AC) 100-10 MG/5ML syrup Take 10 mLs by mouth 3 (three) times daily as needed. 09/09/14   Tammy Triplett, PA-C  omeprazole (PRILOSEC) 40 MG capsule Take 1 capsule (40 mg total) by mouth daily. 07/01/13   [name], MD  oseltamivir (TAMIFLU) 75 MG capsule Take 1 capsule (75 mg total) by mouth 2 (two) times daily. For 5 days 09/09/14   Tammy Triplett, PA-C  promethazine (PHENERGAN) 25 MG tablet Take 1 tablet (25 mg total) by mouth every 6 (six) hours as needed for nausea. 01/24/13   Vanetta Mulders, MD  sucralfate (CARAFATE) 1 G tablet Take 1 tablet (1 g total) by mouth 4 (four) times daily -  with meals and at bedtime. 07/01/13   [name], MD  traMADol (ULTRAM) 50 MG tablet Take 1 tablet (50 mg total) by mouth every 6 (six) hours as needed. 10/25/14   Burgess Amor, PA-C   There were no vitals taken for this visit. Physical Exam  Constitutional: He is oriented to person, place, and time. He appears well-developed and well-nourished.  Non-toxic appearance.  HENT:  Head: Normocephalic.  Right Ear: Tympanic membrane and external ear normal.  Left Ear: Tympanic membrane and external ear normal.  Mouth/Throat:    Left jaw swelling present. No visible abscess. No swelling under the tongue. Airway patent.  Eyes:  EOM and lids are normal. Pupils are equal, round, and reactive to light.  Neck: Normal range of motion. Neck supple. Carotid bruit is not present.  Cardiovascular: Normal rate, regular rhythm, normal heart sounds, intact distal pulses and normal pulses.   Pulmonary/Chest: Breath sounds normal. No respiratory distress.  Abdominal: Soft. Bowel sounds are normal. There is no tenderness. There is no guarding.  Musculoskeletal: Normal range of motion.  Lymphadenopathy:       Head (right side): No submandibular adenopathy  present.       Head (left side): No submandibular adenopathy present.    He has no cervical adenopathy.  Neurological: He is alert and oriented to person, place, and time. He has normal strength. No cranial nerve deficit or sensory deficit.  Skin: Skin is warm and dry.  Psychiatric: He has a normal mood and affect. His speech is normal.  Nursing note and vitals reviewed.   ED Course  Procedures (including critical care time) Labs Review Labs Reviewed - No data to display  Imaging Review No results found.   EKG Interpretation None      MDM  Vital signs are within normal limits. Pulse oximetry is 98% on room air. The airway is patent. There is no swelling under the tongue. There is no evidence for Ludwig's angina.  I have reviewed the May 16 emergency department visit. It is not seem to be much of a difference in the examination description compared to tonight. The patient was treated with intramuscular Rocephin. The patient is asked to use ibuprofen and Tylenol every 6 hours. The patient is asked to see his dental specialist as sone as possible for additional management of this issue. Patient is to return to the emergency department if any emergent changes, problems, or concerns.    Final diagnoses:  None    **I have reviewed nursing notes, vital signs, and all appropriate lab and imaging results for this patient.Ian Gonzalez*    Ian Ilg, PA-C 11/01/14 0026  Paula LibraJohn Molpus, MD 11/01/14 [phone]

## 2014-10-31 NOTE — ED Notes (Signed)
Patient c/o left bottom back tooth pain x 1 week; states was seen last week for same.

## 2014-11-01 MED ORDER — LIDOCAINE HCL (PF) 1 % IJ SOLN
INTRAMUSCULAR | Status: AC
Start: 2014-11-01 — End: 2014-11-01
  Administered 2014-11-01: 5 mL
  Filled 2014-11-01: qty 5

## 2014-11-01 MED ORDER — ACETAMINOPHEN 325 MG PO TABS
650.0000 mg | ORAL_TABLET | Freq: Once | ORAL | Status: AC
Start: 2014-11-01 — End: 2014-11-01
  Administered 2014-11-01: 650 mg via ORAL
  Filled 2014-11-01: qty 2

## 2014-11-01 MED ORDER — CEFTRIAXONE SODIUM 1 G IJ SOLR
1.0000 g | Freq: Once | INTRAMUSCULAR | Status: AC
  Administered 2014-11-01: 1 g via INTRAMUSCULAR
  Filled 2014-11-01: qty 10

## 2014-11-01 MED ORDER — IBUPROFEN 800 MG PO TABS
800.0000 mg | ORAL_TABLET | Freq: Once | ORAL | Status: AC
  Administered 2014-11-01: 800 mg via ORAL
  Filled 2014-11-01: qty 1

## 2014-11-01 NOTE — Discharge Instructions (Signed)
Your vital signs are within normal limits. Is no drainable abscess visualized on your examination at this time. Please see your dentist as sone as possible for additional evaluation and management of this problem. You were treated tonight with intramuscular Rocephin. Please use 800 mg of ibuprofen, and 500 mg Tylenol every 6 hours for your discomfort. Please take these medicines with food. Dental Pain Toothache is pain in or around a tooth. It may get worse with chewing or with cold or heat.  HOME CARE  Your dentist may use a numbing medicine during treatment. If so, you may need to avoid eating until the medicine wears off. Ask your dentist about this.  Only take medicine as told by your dentist or doctor.  Avoid chewing food near the painful tooth until after all treatment is done. Ask your dentist about this. GET HELP RIGHT AWAY IF:   The problem gets worse or new problems appear.  You have a fever.  There is redness and puffiness (swelling) of the face, jaw, or neck.  You cannot open your mouth.  There is pain in the jaw.  There is very bad pain that is not helped by medicine. MAKE SURE YOU:   Understand these instructions.  Will watch your condition.  Will get help right away if you are not doing well or get worse. Document Released: 11/14/2007 Document Revised: 08/20/2011 Document Reviewed: 11/14/2007 [facility] Patient Information 2015 [employer], [place]. This information is not intended to replace advice given to you by your health care provider. Make sure you discuss any questions you have with your health care provider.

## 2014-11-12 ENCOUNTER — Encounter (HOSPITAL_COMMUNITY): Payer: Self-pay | Admitting: Emergency Medicine

## 2014-11-12 ENCOUNTER — Emergency Department (HOSPITAL_COMMUNITY)
Admission: EM | Admit: 2014-11-12 | Discharge: 2014-11-13 | Disposition: A | Discharge status: Home / Self Care | Payer: Medicaid Other | Attending: Emergency Medicine | Admitting: Emergency Medicine

## 2014-11-12 DIAGNOSIS — Z792 Long term (current) use of antibiotics: Secondary | ICD-10-CM | POA: Insufficient documentation

## 2014-11-12 DIAGNOSIS — Z8669 Personal history of other diseases of the nervous system and sense organs: Secondary | ICD-10-CM | POA: Insufficient documentation

## 2014-11-12 DIAGNOSIS — F319 Bipolar disorder, unspecified: Secondary | ICD-10-CM | POA: Diagnosis not present

## 2014-11-12 DIAGNOSIS — K088 Other specified disorders of teeth and supporting structures: Secondary | ICD-10-CM | POA: Diagnosis present

## 2014-11-12 DIAGNOSIS — Z720 Tobacco use: Secondary | ICD-10-CM | POA: Diagnosis not present

## 2014-11-12 DIAGNOSIS — K029 Dental caries, unspecified: Secondary | ICD-10-CM | POA: Diagnosis not present

## 2014-11-12 DIAGNOSIS — Z79899 Other long term (current) drug therapy: Secondary | ICD-10-CM | POA: Insufficient documentation

## 2014-11-12 MED ORDER — IBUPROFEN 800 MG PO TABS
800.0000 mg | ORAL_TABLET | Freq: Once | ORAL | Status: AC
  Administered 2014-11-13: 800 mg via ORAL
  Filled 2014-11-12: qty 1

## 2014-11-12 MED ORDER — IBUPROFEN 800 MG PO TABS: 800.0000 mg | ORAL_TABLET | Freq: Three times a day (TID) | ORAL | Status: DC | PRN

## 2014-11-12 MED ORDER — PENICILLIN V POTASSIUM 250 MG PO TABS
500.0000 mg | ORAL_TABLET | Freq: Once | ORAL | Status: AC
  Administered 2014-11-13: 500 mg via ORAL
  Filled 2014-11-12: qty 2

## 2014-11-12 MED ORDER — HYDROCODONE-ACETAMINOPHEN 5-325 MG PO TABS: 1.0000 | ORAL_TABLET | Freq: Four times a day (QID) | ORAL | Status: DC | PRN

## 2014-11-12 MED ORDER — HYDROCODONE-ACETAMINOPHEN 5-325 MG PO TABS
2.0000 | ORAL_TABLET | Freq: Once | ORAL | Status: AC
  Administered 2014-11-13: 2 via ORAL
  Filled 2014-11-12: qty 2

## 2014-11-12 MED ORDER — PENICILLIN V POTASSIUM 500 MG PO TABS: 500.0000 mg | ORAL_TABLET | Freq: Four times a day (QID) | ORAL | Status: DC

## 2014-11-12 NOTE — ED Provider Notes (Signed)
This chart was scribed for Layla Maw Ward, DO by Roxy Cedar, ED Scribe. This patient was seen in room [facility] and the patient's care was started at 11:41 PM.  TIME SEEN: 11:41 PM  CHIEF COMPLAINT: Dental Problem  HPI: HPI Comments: Ian Gonzalez is a 29 y.o. male who presents to the Emergency Department complaining of moderate left lower dental pain for the past 2 weeks. Patient was given amoxicillin, which provided mild relief. Patient was seen again and was given a shot of Rocephin. He denies Patient states that he has not been able to be seen by [facility]. Patient is no longer taking antibiotic medication. States he was given tramadol for pain previously which helped minimally. States he is unable to sleep because of throbbing pain. Denies fever. No facial swelling. No difficulty swallowing or speaking.  ROS: See HPI Constitutional: no fever  Eyes: no drainage  ENT: no runny nose   Cardiovascular:  no chest pain  Resp: no SOB  GI: no vomiting GU: no dysuria Integumentary: no rash  Allergy: no hives  Musculoskeletal: no leg swelling  Neurological: no slurred speech ROS otherwise negative  PAST MEDICAL HISTORY/PAST SURGICAL HISTORY:  Past Medical History  Diagnosis Date  . Bipolar 1 disorder   . Depression   . Sleep disorder     MEDICATIONS:  Prior to Admission medications   Medication Sig Start Date End Date Taking? Authorizing Provider  amoxicillin (AMOXIL) 500 MG capsule Take 1 capsule (500 mg total) by mouth 3 (three) times daily. 10/25/14   Burgess Amor, PA-C  Aspirin-Acetaminophen-Caffeine (GOODY HEADACHE PO) Take 1 packet by mouth daily as needed.    Historical Provider, MD  famotidine (PEPCID) 20 MG tablet Take 1 tablet (20 mg total) by mouth 2 (two) times daily. 01/24/13   Vanetta Mulders, MD  guaiFENesin-codeine (ROBITUSSIN AC) 100-10 MG/5ML syrup Take 10 mLs by mouth 3 (three) times daily as needed. 09/09/14   Tammy Triplett, PA-C  omeprazole  (PRILOSEC) 40 MG capsule Take 1 capsule (40 mg total) by mouth daily. 07/01/13   [name], MD  oseltamivir (TAMIFLU) 75 MG capsule Take 1 capsule (75 mg total) by mouth 2 (two) times daily. For 5 days 09/09/14   Tammy Triplett, PA-C  promethazine (PHENERGAN) 25 MG tablet Take 1 tablet (25 mg total) by mouth every 6 (six) hours as needed for nausea. 01/24/13   Vanetta Mulders, MD  sucralfate (CARAFATE) 1 G tablet Take 1 tablet (1 g total) by mouth 4 (four) times daily -  with meals and at bedtime. 07/01/13   [name], MD  traMADol (ULTRAM) 50 MG tablet Take 1 tablet (50 mg total) by mouth every 6 (six) hours as needed. 10/25/14   Burgess Amor, PA-C    ALLERGIES:  No Known Allergies  SOCIAL HISTORY:  History  Substance Use Topics  . Smoking status: Current Every Day Smoker -- 1.00 packs/day for 10 years    Types: Cigarettes  . Smokeless tobacco: Not on file  . Alcohol Use: Yes     Comment: Occassionally     FAMILY HISTORY: History reviewed. No pertinent family history.  EXAM: Triage Vitals: BP 155/110 mmHg  Pulse 107  Temp(Src) 98.3 F (36.8 C) (Oral)  Resp 20  Ht  (1.905 m)  Wt 360 lb (163.295 kg)  BMI 45.00 kg/m2  SpO2 100%  CONSTITUTIONAL: Alert and oriented and responds appropriately to questions. Well-appearing; well-nourished. Non-toxic appearing. HEAD: Normocephalic EYES: Conjunctivae clear, PERRL ENT: normal nose; no  rhinorrhea; moist mucous membranes; pharynx without lesions noted; Multiple dental carries with fractured teeth. No obvious sign of abscess. No facial swelling, warmth or erythema. No ludwig's angina. No trismus or drooling. No tonsillar hypertrophy or exudate. Normal phonation. Tongue sits flat in the bottom of mouth. NECK: Supple, no meningismus, no LAD  CARD: Regular and tachycardic; S1 and S2 appreciated; no murmurs, no clicks, no rubs, no gallops RESP: Normal chest excursion without splinting or tachypnea; breath sounds clear and equal bilaterally;  no wheezes, no rhonchi, no rales, no hypoxia or respiratory distress, speaking full sentences ABD/GI: Normal bowel sounds; non-distended; soft, non-tender, no rebound, no guarding, no peritoneal signs BACK:  The back appears normal and is non-tender to palpation, there is no CVA tenderness EXT: Normal ROM in all joints; non-tender to palpation; no edema; normal capillary refill; no cyanosis, no calf tenderness or swelling    SKIN: Normal color for age and race; warm NEURO: Moves all extremities equally, sensation to light touch intact diffusely, cranial nerves II through XII intact PSYCH: The patient's mood and manner are appropriate. Grooming and personal hygiene are appropriate.  MEDICAL DECISION MAKING: Patient here with dental caries causing pain. He does appear uncomfortable on exam and is mildly tachycardic, hypertensive. He does not appear toxic. Have discussed with him at length that he needs to follow-up with a dentist for further evaluation. Have recommended he call Monday morning. We'll discharge him with a short prescription for Vicodin for pain control. Have advised him in the future that we may not be able to provide him with narcotic medication. Will also discharge with penicillin prophylactically. Discussed strict return precautions. He verbalized understanding and is comfortable with plan.      I personally performed the services described in this documentation, which was scribed in my presence. The recorded information has been reviewed and is accurate.   Layla MawKristen N Ward, DO 11/12/14 2351

## 2014-11-12 NOTE — Discharge Instructions (Signed)
Dental Care and Dentist Visits °Dental care supports good overall health. Regular dental visits can also help you avoid dental pain, bleeding, infection, and other more serious health problems in the future. It is important to keep the mouth healthy because diseases in the teeth, gums, and other oral tissues can spread to other areas of the body. Some problems, such as diabetes, heart disease, and pre-term labor have been associated with poor oral health.  °See your dentist every 6 months. If you experience emergency problems such as a toothache or broken tooth, go to the dentist right away. If you see your dentist regularly, you may catch problems early. It is easier to be treated for problems in the early stages.  °WHAT TO EXPECT AT A DENTIST VISIT  °Your dentist will look for many common oral health problems and recommend proper treatment. At your regular dental visit, you can expect: °· Gentle cleaning of the teeth and gums. This includes scraping and polishing. This helps to remove the sticky substance around the teeth and gums (plaque). Plaque forms in the mouth shortly after eating. Over time, plaque hardens on the teeth as tartar. If tartar is not removed regularly, it can cause problems. Cleaning also helps remove stains. °· Periodic X-rays. These pictures of the teeth and supporting bone will help your dentist assess the health of your teeth. °· Periodic fluoride treatments. Fluoride is a natural mineral shown to help strengthen teeth. Fluoride treatment involves applying a fluoride gel or varnish to the teeth. It is most commonly done in children. °· Examination of the mouth, tongue, jaws, teeth, and gums to look for any oral health problems, such as: °· Cavities (dental caries). This is decay on the tooth caused by plaque, sugar, and acid in the mouth. It is best to catch a cavity when it is small. °· Inflammation of the gums caused by plaque buildup (gingivitis). °· Problems with the mouth or malformed  or misaligned teeth. °· Oral cancer or other diseases of the soft tissues or jaws.  °KEEP YOUR TEETH AND GUMS HEALTHY °For healthy teeth and gums, follow these general guidelines as well as your dentist's specific advice: °· Have your teeth professionally cleaned at the dentist every 6 months. °· Brush twice daily with a fluoride toothpaste. °· Floss your teeth daily.  °· Ask your dentist if you need fluoride supplements, treatments, or fluoride toothpaste. °· Eat a healthy diet. Reduce foods and drinks with added sugar. °· Avoid smoking. °TREATMENT FOR ORAL HEALTH PROBLEMS °If you have oral health problems, treatment varies depending on the conditions present in your teeth and gums. °· Your caregiver will most likely recommend good oral hygiene at each visit. °· For cavities, gingivitis, or other oral health disease, your caregiver will perform a procedure to treat the problem. This is typically done at a separate appointment. Sometimes your caregiver will refer you to another dental specialist for specific tooth problems or for surgery. °SEEK IMMEDIATE DENTAL CARE IF: °· You have pain, bleeding, or soreness in the gum, tooth, jaw, or mouth area. °· A permanent tooth becomes loose or separated from the gum socket. °· You experience a blow or injury to the mouth or jaw area. °Document Released: 02/07/2011 Document Revised: 08/20/2011 Document Reviewed: 02/07/2011 °ExitCare® Patient Information ©2015 [employer]. This information is not intended to replace advice given to you by your health care provider. Make sure you discuss any questions you have with your health care provider. °Dental Caries °Dental caries (also called tooth decay) is   the most common oral disease. It can occur at any age but is more common in children and young adults.  °HOW DENTAL CARIES DEVELOPS  °The process of decay begins when bacteria and foods (particularly sugars and starches) combine in your mouth to produce plaque. Plaque is a  substance that sticks to the hard, outer surface of a tooth (enamel). The bacteria in plaque produce acids that attack enamel. These acids may also attack the root surface of a tooth (cementum) if it is exposed. Repeated attacks dissolve these surfaces and create holes in the tooth (cavities). If left untreated, the acids destroy the other layers of the tooth.  °RISK FACTORS °· Frequent sipping of sugary beverages.   °· Frequent snacking on sugary and starchy foods, especially those that easily get stuck in the teeth.   °· Poor oral hygiene.   °· Dry mouth.   °· Substance abuse such as methamphetamine abuse.   °· Broken or poor-fitting dental restorations.   °· Eating disorders.   °· Gastroesophageal reflux disease (GERD).   °· Certain radiation treatments to the head and neck. °SYMPTOMS °In the early stages of dental caries, symptoms are seldom present. Sometimes white, chalky areas may be seen on the enamel or other tooth layers. In later stages, symptoms may include: °· Pits and holes on the enamel. °· Toothache after sweet, hot, or cold foods or drinks are consumed. °· Pain around the tooth. °· Swelling around the tooth. °DIAGNOSIS  °Most of the time, dental caries is detected during a regular dental checkup. A diagnosis is made after a thorough medical and dental history is taken and the surfaces of your teeth are checked for signs of dental caries. Sometimes special instruments, such as lasers, are used to check for dental caries. Dental X-ray exams may be taken so that areas not visible to the eye (such as between the contact areas of the teeth) can be checked for cavities.  °TREATMENT  °If dental caries is in its early stages, it may be reversed with a fluoride treatment or an application of a remineralizing agent at the dental office. Thorough brushing and flossing at home is needed to aid these treatments. If it is in its later stages, treatment depends on the location and extent of tooth destruction:   °· If a small area of the tooth has been destroyed, the destroyed area will be removed and cavities will be filled with a material such as gold, silver amalgam, or composite resin.   °· If a large area of the tooth has been destroyed, the destroyed area will be removed and a cap (crown) will be fitted over the remaining tooth structure.   °· If the center part of the tooth (pulp) is affected, a procedure called a root canal will be needed before a filling or crown can be placed.   °· If most of the tooth has been destroyed, the tooth may need to be pulled (extracted). °HOME CARE INSTRUCTIONS °You can prevent, stop, or reverse dental caries at home by practicing good oral hygiene. Good oral hygiene includes: °· Thoroughly cleaning your teeth at least twice a day with a toothbrush and dental floss.   °· Using a fluoride toothpaste. A fluoride mouth rinse may also be used if recommended by your dentist or health care provider.   °· Restricting the amount of sugary and starchy foods and sugary liquids you consume.   °· Avoiding frequent snacking on these foods and sipping of these liquids.   °· Keeping regular visits with a dentist for checkups and cleanings. °PREVENTION  °·   Practice good oral hygiene. °· Consider a dental sealant. A dental sealant is a coating material that is applied by your dentist to the pits and grooves of teeth. The sealant prevents food from being trapped in them. It may protect the teeth for several years. °· Ask about fluoride supplements if you live in a community without fluorinated water or with water that has a low fluoride content. Use fluoride supplements as directed by your dentist or health care provider. °· Allow fluoride varnish applications to teeth if directed by your dentist or health care provider. °Document Released: 02/17/2002 Document Revised: 10/12/2013 Document Reviewed: 05/30/2012 °ExitCare® Patient Information ©2015 [employer]. This information is not intended to  replace advice given to you by your health care provider. Make sure you discuss any questions you have with your health care provider. ° ° ° °Emergency Department Resource Guide °1) Find a Doctor and Pay Out of Pocket °Although you won't have to find out who is covered by your insurance plan, it is a good idea to ask around and get recommendations. You will then need to call the office and see if the doctor you have chosen will accept you as a new patient and what types of options they offer for patients who are self-pay. Some doctors offer discounts or will set up payment plans for their patients who do not have insurance, but you will need to ask so you aren't surprised when you get to your appointment. ° °2) Contact Your Local Health Department °Not all health departments have doctors that can see patients for sick visits, but many do, so it is worth a call to see if yours does. If you don't know where your local health department is, you can check in your phone book. The CDC also has a tool to help you locate your state's health department, and many state websites also have listings of all of their local health departments. ° °3) Find a Walk-in Clinic °If your illness is not likely to be very severe or complicated, you may want to try a walk in clinic. These are popping up all over the country in pharmacies, drugstores, and shopping centers. They're usually staffed by nurse practitioners or physician assistants that have been trained to treat common illnesses and complaints. They're usually fairly quick and inexpensive. However, if you have serious medical issues or chronic medical problems, these are probably not your best option. ° °No Primary Care Doctor: °- Call [facility] at  832-8000 - they can help you locate a primary care doctor that  accepts your insurance, provides certain services, etc. °- Physician Referral Service- 1-800-533-3463 ° °Chronic Pain Problems: °Organization         Address  Phone    Notes  °[facility]  (336) 297-2271 Patients need to be referred by their primary care doctor.  ° °Medication Assistance: °Organization         Address  Phone   Notes  °[place] [facility] [address] °[place], [place] [address] (336) 641-8030 --Must be a resident of [place] °-- Must have NO insurance coverage whatsoever (no Medicaid/ Medicare, etc.) °-- The pt. MUST have a primary care doctor that directs their care regularly and follows them in the community °  °[facility]  (866) 331-1348   °[employer]  (888) 892-1162   ° °Agencies that provide inexpensive medical care: °Organization         Address  Phone     Notes  °[facility]  (336) 832-8035   °[facility]    (336) 832-7272   °[facility] [address] °[place], [place] [address] (336) 832-4777   °[facility] [address], °[place] (336) 271-4999   °[facility]    (336) 373-0678   °[facility]    (336) 272-1050   °[facility] ° [address], [place] Phone:  (336) 832-4444, Fax:  (336) 832-4440 Hours of Operation:  9 am - 6 pm, M-F.  Also accepts Medicaid/Medicare and self-pay.  °[facility] ° [address], [place] Phone: (336) 832-3150, Fax: (336) [phone]. Hours of Operation:  8:30 am - 5:30 pm, M-F.  Also accepts Medicaid and self-pay.  °[facility] [place] [address], [place] Phone: (336) 878-6027   °[facility] [address], [place], [place] (336)723-1848, Ext. 123 Mondays & Thursdays: 7-9 AM.  First 15 patients are seen on a first come, first serve basis. °  ° °Medicaid-accepting [place] Providers: ° °Organization         Address  Phone   Notes  °[facility] [address], [place] (336) 641-2100 Also accepts self-pay patients.  °[facility]  [address], [place] ° (336) 856-9996   °[facility] [address], [place] (336) 288-8857   °[facility] [address] [place] [address], [place] (336) 299-7000   °Veita Bland [address], [place]  ° (336) 373-1557 Only accepts [place] Access Medicaid patients after they have their name applied to their card.  ° °Self-Pay (no insurance) in [place]: ° °Organization         Address  Phone   Notes  °Sickle Cell Patients, [facility] [address], [place] (336) 832-1970   °[facility] [address], [place] (336) 832-4400   °[facility] ° [address], [place] (336) 992-4800   °[facility]/Dr. Osei-Bonsu ° [address] [place] [address], [place] or [address], [place] (336) 841-8500 Phone number for both [place] and [place] locations is the same.  °[facility] [address], [place] (336) 299-0000   °[facility] [address] [place] [address], [place] or [address] (336) 852-7530 °(336) 878-2260   °[facility] [address], [place] (336) 350-1642, phone; (336) 294-5005, fax Sees patients 1st and 3rd Saturday of every month.  Must not qualify for public or private insurance (i.e. Medicaid, Medicare, [place] Health Choice, Veterans' Benefits) • Household income should be no more than 200% of the poverty level •The clinic cannot treat you if you are pregnant or think you are pregnant • Sexually transmitted diseases are not treated at the clinic.  ° ° °Dental Care: °Organization         Address  Phone  Notes  °[place] [facility] [address], [place] (336) 641-6152 Accepts children up to age 21 who are enrolled in Medicaid or [place] Health Choice; pregnant women with a Medicaid card; and children who have  applied for Medicaid or [place] Health Choice, but were declined, whose parents can pay a reduced fee at time of service.  °[place]   [facility] [place]  [address], [place] (336) 641-7733 Accepts children up to age 21 who are enrolled in Medicaid or [place] Health Choice; pregnant women with a Medicaid card; and children who have applied for Medicaid or [place] Health Choice, but were declined, whose parents can pay a reduced fee at time of service.  °[facility] ° [address], [place] (336) 641-4533 Patients are seen by appointment only. Walk-ins are not accepted. [facility] will see patients 18 years of age and older. °Monday - Tuesday (8am-5pm) °Most Wednesdays (8:30-5pm) °$30 per visit, cash only  °[facility] ° [address], [place] (336) 641-4533 Patients are seen by appointment only. Walk-ins are not accepted. [facility] will see patients 18 years of age and older. °One Wednesday Evening (Monthly: Volunteer Based).  $30 per visit, cash only  °[facility]  (919) 537-3737 for adults; Children under age 4, call Graduate Pediatric Dentistry at ([phone]) [phone]. Children aged 4-14, please call ([phone]) [phone] to request a pediatric application. ° Dental services are provided in all areas of dental care including fillings, crowns and bridges, complete and partial dentures, implants, gum treatment, root canals, and extractions. Preventive care is also provided. Treatment is provided to both adults and children. °Patients are selected via a lottery and there is often a waiting list. °  °[facility] [address], °[place] ° (336) 763-8833 [email or web address] °  °[facility] [address], [place], [place] (336)723-1848, Ext. 123 Second and Fourth Thursday of each month, opens at 6:30 AM; Clinic ends at 9 AM.  Patients are seen on a first-come first-served basis, and a  limited number are seen during each clinic.  ° °[facility] ° [address], [place], [place] (336) 723-7904   Eligibility Requirements °You must have lived in [place], [place], or [place] counties for at least the last three months. °  You cannot be eligible for state or federal sponsored healthcare insurance, including Veterans Administration, Medicaid, or Medicare. °  You generally cannot be eligible for healthcare insurance through your employer.  °  How to apply: °Eligibility screenings are held every Tuesday and Wednesday afternoon from 1:00 pm until 4:00 pm. You do not need an appointment for the interview!  °[facility] [address], [place], [place] 336-631-2330   °[facility] [facility]  336-342-8273   °[facility]  336-703-3100   ° [facility]  336-570-6415   ° °Behavioral Health Resources in the Community: °Intensive Outpatient Programs °Organization         Address  Phone  Notes  °[facility] [address], [place], [place] 336-878-6098   °[facility] [address], [place], [place] 336-832-9800   °[facility] [address], [place], [place] ° 336-882-2125   °[facility] [address],  °[place],  1-800-853-5163 or 336-641-4981   °Substance Abuse Resources °Organization         Address  Phone  Notes  °[facility]  336-882-2125   °[facility]  336-784-9470   °[facility]  336-285-9073   °[facility]  336-845-3988   °Residential & Outpatient Substance Abuse Program  1-800-659-3381   °Psychological Services °Organization         Address  Phone  Notes  °[facility]  336- 832-9600   °[facility]  336- 378-7881   °[facility] [address], [place] 1-800-853-5163 or 336-641-4981   ° °Mobile Crisis Teams °Organization          Address  Phone  Notes  °[facility], Mobile Crisis Care Unit  1-877-626-1772   °Assertive °[facility] ° [address] [place], [place] 336-834-9664   °Sharon DeEsch [address] °[place] 336-554-5454   ° °Self-Help/Support Groups °Organization         Address  Phone             Notes  °[facility] - variety of support groups  336- 373-1402 Call for more information  °Narcotics Anonymous (NA), [facility] [address], °[place]  2 meetings at this location  ° °Residential Treatment Programs °Organization         Address  Phone  Notes  °[facility] [address],    °[place]  1-866-801-8205   °[facility] ° [address], [place], [place] 704-293-8524   °[facility] [address], [place] 336-845-3988 Admissions: 8am-3pm M-F  °[facility] [address]    °[place], [place] 336-841-1104   °[facility] [address], [place], [place] 336-379-7146   °[facility] [address]  °[place], [place] 336-285-9073   °[facility] Intensive Outpatient [address], [place], [place] 336-852-3033   °[facility] [address]  °[place], [place] 1-877-615-2722 or 336-784-9470   °[facility] [address] [place], [place] 336-227-7417 Accepts Medicaid  °[facility] [address]  °[place] 1-800-659-3381 Substance Abuse/Addiction Treatment  ° °[place] Behavioral Health Resources °Organization         Address  Phone  Notes  °[facility]  (888) 581-9988   °Julie Brannon, PhD [address] [place], [place]   (336) 349-5553 or (336) 951-0000   °[facility]   [address] °[place], [place] (336) 349-4454   °[facility] [address], [place], [place] (336) 342-8316 Insurance/Medicaid/sponsorship  through [facility]  °[facility] [address]                                    [place], [place] (336) 342-8316 Therapy/tele-psych/case  °[facility] [address]  ° [place], [place] (336) 349-2233    °Dr. Arfeen  (336) 349-4544   °[facility] [place]  [employer] [place] [facility] 1) [address], [place] °2) [address], [place] °3)  [address], [place] (336) 349-3220 °(336) 342-7768 ° °(336) 342-8140   °[place] [facility] (336) 342-1394 or (336) 342-3537 (After Hours)    ° ° ° °

## 2014-11-12 NOTE — ED Notes (Signed)
Patient reports dental pain for three weeks. States is waiting to have two of them pulled. Reports pain is increasing and is needing something for pain.

## 2014-11-13 NOTE — ED Notes (Signed)
Patient verbalizes understanding of discharge instructions, prescription medications, home care and follow up care. Patient ambulatory out of department at this time with family. 

## 2014-11-27 ENCOUNTER — Emergency Department (HOSPITAL_COMMUNITY)
Admission: EM | Admit: 2014-11-27 | Discharge: 2014-11-28 | Disposition: A | Discharge status: Home / Self Care | Payer: Medicaid Other | Attending: Emergency Medicine | Admitting: Emergency Medicine

## 2014-11-27 ENCOUNTER — Encounter (HOSPITAL_COMMUNITY): Payer: Self-pay | Admitting: Emergency Medicine

## 2014-11-27 DIAGNOSIS — F319 Bipolar disorder, unspecified: Secondary | ICD-10-CM | POA: Diagnosis not present

## 2014-11-27 DIAGNOSIS — Z79899 Other long term (current) drug therapy: Secondary | ICD-10-CM | POA: Diagnosis not present

## 2014-11-27 DIAGNOSIS — K047 Periapical abscess without sinus: Secondary | ICD-10-CM | POA: Diagnosis not present

## 2014-11-27 DIAGNOSIS — K088 Other specified disorders of teeth and supporting structures: Secondary | ICD-10-CM | POA: Insufficient documentation

## 2014-11-27 DIAGNOSIS — K0889 Other specified disorders of teeth and supporting structures: Secondary | ICD-10-CM

## 2014-11-27 DIAGNOSIS — Z720 Tobacco use: Secondary | ICD-10-CM | POA: Diagnosis not present

## 2014-11-27 DIAGNOSIS — Z8669 Personal history of other diseases of the nervous system and sense organs: Secondary | ICD-10-CM | POA: Insufficient documentation

## 2014-11-27 DIAGNOSIS — Z792 Long term (current) use of antibiotics: Secondary | ICD-10-CM | POA: Insufficient documentation

## 2014-11-27 NOTE — ED Notes (Signed)
Pt had molar to left bottom pulled on Monday. Has had pain and swelling since approx. Wed. Taking motrin and tylenol with no relief. PCN taken until Wed. And last ibuprofen at 2300.

## 2014-11-28 NOTE — Discharge Instructions (Signed)

## 2014-11-28 NOTE — ED Provider Notes (Signed)
CSN: [id number]     Arrival date & time 11/27/14  2347 History   First MD Initiated Contact with Patient 11/28/14 0019     Chief Complaint  Patient presents with  . Dental Pain     (Consider location/radiation/quality/duration/timing/severity/associated sxs/prior Treatment) The history is provided by the patient.   Ian Gonzalez is a 29 y.o. male presenting with post extraction dental pain.  He had his left lower molar tooth pulled 5 days ago by Dr. Raford Pitcher and felt fine until he finished his penicillin 3 days ago at which time he slowly developed pain at the site which has been constant and now worse.  He also endorses it started after he smoked a cigarette.  He denies fevers or chills and there has been no bleeding or drainage from the socket. He has taken ibuprofen without relief of pain.     Past Medical History  Diagnosis Date  . Bipolar 1 disorder   . Depression   . Sleep disorder    History reviewed. No pertinent past surgical history. History reviewed. No pertinent family history. History  Substance Use Topics  . Smoking status: Current Every Day Smoker -- 1.00 packs/day for 10 years    Types: Cigarettes  . Smokeless tobacco: Not on file  . Alcohol Use: Yes     Comment: Occassionally     Review of Systems  Constitutional: Negative for fever.  HENT: Positive for dental problem. Negative for facial swelling and sore throat.   Respiratory: Negative for shortness of breath.   Musculoskeletal: Negative for neck pain and neck stiffness.      Allergies  Review of patient's allergies indicates no known allergies.  Home Medications   Prior to Admission medications   Medication Sig Start Date End Date Taking? Authorizing Provider  amoxicillin (AMOXIL) 500 MG capsule Take 1 capsule (500 mg total) by mouth 3 (three) times daily. 10/25/14   Burgess Amor, PA-C  Aspirin-Acetaminophen-Caffeine (GOODY HEADACHE PO) Take 1 packet by mouth daily as needed.    Historical Provider,  MD  famotidine (PEPCID) 20 MG tablet Take 1 tablet (20 mg total) by mouth 2 (two) times daily. 01/24/13   Vanetta Mulders, MD  guaiFENesin-codeine (ROBITUSSIN AC) 100-10 MG/5ML syrup Take 10 mLs by mouth 3 (three) times daily as needed. 09/09/14   Tammy Triplett, PA-C  HYDROcodone-acetaminophen (NORCO/VICODIN) 5-325 MG per tablet Take 1 tablet by mouth every 6 (six) hours as needed. 11/12/14   Kristen N Ward, DO  ibuprofen (ADVIL,MOTRIN) 800 MG tablet Take 1 tablet (800 mg total) by mouth every 8 (eight) hours as needed for mild pain. 11/12/14   Kristen N Ward, DO  omeprazole (PRILOSEC) 40 MG capsule Take 1 capsule (40 mg total) by mouth daily. 07/01/13   [name], MD  oseltamivir (TAMIFLU) 75 MG capsule Take 1 capsule (75 mg total) by mouth 2 (two) times daily. For 5 days 09/09/14   Tammy Triplett, PA-C  penicillin v potassium (VEETID) 500 MG tablet Take 1 tablet (500 mg total) by mouth 4 (four) times daily. 11/12/14   Kristen N Ward, DO  promethazine (PHENERGAN) 25 MG tablet Take 1 tablet (25 mg total) by mouth every 6 (six) hours as needed for nausea. 01/24/13   Vanetta Mulders, MD  sucralfate (CARAFATE) 1 G tablet Take 1 tablet (1 g total) by mouth 4 (four) times daily -  with meals and at bedtime. 07/01/13   [name], MD  traMADol (ULTRAM) 50 MG tablet Take 1 tablet (50 mg  total) by mouth every 6 (six) hours as needed. 10/25/14   Burgess Amor, PA-C   BP 136/105 mmHg  Pulse 119  Temp(Src) 98.2 F (36.8 C) (Oral)  Resp 18  Ht 6\' 2"  (1.88 m)  Wt 360 lb (163.295 kg)  BMI 46.20 kg/m2  SpO2 98% Physical Exam  Constitutional: He is oriented to person, place, and time. He appears well-developed and well-nourished. No distress.  HENT:  Head: Normocephalic and atraumatic.  Right Ear: Tympanic membrane and external ear normal.  Left Ear: Tympanic membrane and external ear normal.  Mouth/Throat: Uvula is midline, oropharynx is clear and moist and mucous membranes are normal. No oral lesions. No trismus in  the jaw. Dental abscesses present.  Recent extraction of left lower 3nd molar tooth.  There is no clot noted in the socket space.  Eyes: Conjunctivae are normal.  Neck: Normal range of motion. Neck supple.  Cardiovascular: Normal rate and normal heart sounds.   Pulmonary/Chest: Effort normal.  Abdominal: He exhibits no distension.  Musculoskeletal: Normal range of motion.  Lymphadenopathy:    He has no cervical adenopathy.  Neurological: He is alert and oriented to person, place, and time.  Skin: Skin is warm and dry. No erythema.  Psychiatric: He has a normal mood and affect.    ED Course  NERVE BLOCK Date/Time: 11/28/2014 1:00 AM Performed by: Burgess Amor Authorized by: Burgess Amor Risks and benefits: risks, benefits and alternatives were discussed Consent given by: patient Time out: Immediately prior to procedure a "time out" was called to verify the correct patient, procedure, equipment, support staff and site/side marked as required. Indications: pain relief Body area: face/mouth Nerve: inferior alveolar Laterality: left Patient sedated: no Preparation: Patient was prepped and draped in the usual sterile fashion. Patient position: supine Needle gauge: 27 G Location technique: anatomical landmarks Local anesthetic: bupivacaine 0.5% without epinephrine Anesthetic total: 0.5 ml Outcome: pain improved   (including critical care time)   Labs Review Labs Reviewed - No data to display  Imaging Review No results found.   EKG Interpretation None      MDM   Final diagnoses:  Pain, dental    Dry socket paste applied to the site with partial pain relief obtained. He was also given dental block using bupivacaine.  Plan f/u with dentist on Monday if sx persist.    Burgess Amor, PA-C 11/28/14 0134  Burgess Amor, PA-C 11/28/14 9480  Devoria Albe, MD 11/28/14 [phone]

## 2014-12-28 ENCOUNTER — Encounter (HOSPITAL_COMMUNITY): Payer: Self-pay | Admitting: Emergency Medicine

## 2014-12-28 ENCOUNTER — Emergency Department (HOSPITAL_COMMUNITY)
Admission: EM | Admit: 2014-12-28 | Discharge: 2014-12-28 | Disposition: A | Discharge status: Home / Self Care | Payer: Medicaid Other | Attending: Emergency Medicine | Admitting: Emergency Medicine

## 2014-12-28 DIAGNOSIS — Z8669 Personal history of other diseases of the nervous system and sense organs: Secondary | ICD-10-CM | POA: Insufficient documentation

## 2014-12-28 DIAGNOSIS — G8929 Other chronic pain: Secondary | ICD-10-CM | POA: Insufficient documentation

## 2014-12-28 DIAGNOSIS — K088 Other specified disorders of teeth and supporting structures: Secondary | ICD-10-CM | POA: Diagnosis not present

## 2014-12-28 DIAGNOSIS — Z79899 Other long term (current) drug therapy: Secondary | ICD-10-CM | POA: Insufficient documentation

## 2014-12-28 DIAGNOSIS — Z792 Long term (current) use of antibiotics: Secondary | ICD-10-CM | POA: Insufficient documentation

## 2014-12-28 DIAGNOSIS — Z720 Tobacco use: Secondary | ICD-10-CM | POA: Insufficient documentation

## 2014-12-28 DIAGNOSIS — F329 Major depressive disorder, single episode, unspecified: Secondary | ICD-10-CM | POA: Diagnosis not present

## 2014-12-28 DIAGNOSIS — K029 Dental caries, unspecified: Secondary | ICD-10-CM | POA: Diagnosis not present

## 2014-12-28 DIAGNOSIS — K089 Disorder of teeth and supporting structures, unspecified: Secondary | ICD-10-CM

## 2014-12-28 MED ORDER — OXYCODONE-ACETAMINOPHEN 5-325 MG PO TABS
2.0000 | ORAL_TABLET | Freq: Once | ORAL | Status: AC
Start: 2014-12-28 — End: 2014-12-28
  Administered 2014-12-28: 2 via ORAL
  Filled 2014-12-28: qty 2

## 2014-12-28 NOTE — ED Notes (Signed)
Pt saw Dr. Teola BradleyJenson yesterday (oral surgeon) and is being scheduled for extractions. States his right upper dental pain has worsened since Dr. Teola BradleyJenson "messed with them".

## 2014-12-28 NOTE — Discharge Instructions (Signed)
Dental Pain Take ibuprofen 800 mg every 8 hours for pain as needed. Follow-up with her oral surgeon regarding tooth removal. Toothache is pain in or around a tooth. It may get worse with chewing or with cold or heat.  HOME CARE  Your dentist may use a numbing medicine during treatment. If so, you may need to avoid eating until the medicine wears off. Ask your dentist about this.  Only take medicine as told by your dentist or doctor.  Avoid chewing food near the painful tooth until after all treatment is done. Ask your dentist about this. GET HELP RIGHT AWAY IF:   The problem gets worse or new problems appear.  You have a fever.  There is redness and puffiness (swelling) of the face, jaw, or neck.  You cannot open your mouth.  There is pain in the jaw.  There is very bad pain that is not helped by medicine. MAKE SURE YOU:   Understand these instructions.  Will watch your condition.  Will get help right away if you are not doing well or get worse. Document Released: 11/14/2007 Document Revised: 08/20/2011 Document Reviewed: 11/14/2007 [facility] Patient Information 2015 [place], [place]. This information is not intended to replace advice given to you by your health care provider. Make sure you discuss any questions you have with your health care provider.

## 2014-12-28 NOTE — ED Provider Notes (Signed)
CSN: [id number]     Arrival date & time 12/28/14  1244 History   This chart was scribed for non-physician practitioner Catha Gosselin, PA-C working with Purvis Sheffield, MD by Lyndel Safe, ED Scribe. This patient was seen in room TR07C/TR07C and the patient's care was started at 1:25 PM.      Chief Complaint  Patient presents with  . Dental Pain   The history is provided by the patient. No language interpreter was used.   HPI Comments: Ian Gonzalez is a 29 y.o. male, with a PMhx of poor dentition, who presents to the Emergency Department complaining of an exacerbation of chronic, right, upper dental pain onset 1 day ago. Pt was evaluated by oral surgeon Dr. Teola Bradley 1 day ago and has an appointment for multiple dental extractions. Pt states he requested narcotic pain medication from his oral surgeon who refused to give him a prescription. He notes the pain became exacerbated after this visit to Dr. Teola Bradley. Pt has been evaluated in the ED several times in the past 4 months for the same complaint of dental pain. Denies fevers.   Past Medical History  Diagnosis Date  . Bipolar 1 disorder   . Depression   . Sleep disorder    History reviewed. No pertinent past surgical history. No family history on file. History  Substance Use Topics  . Smoking status: Current Every Day Smoker -- 1.00 packs/day for 10 years    Types: Cigarettes  . Smokeless tobacco: Not on file  . Alcohol Use: Yes     Comment: Occassionally     Review of Systems  Constitutional: Negative for fever and chills.  HENT: Positive for dental problem. Negative for drooling and facial swelling.   Respiratory: Negative for shortness of breath.     Allergies  Review of patient's allergies indicates no known allergies.  Home Medications   Prior to Admission medications   Medication Sig Start Date End Date Taking? Authorizing Provider  amoxicillin (AMOXIL) 500 MG capsule Take 1 capsule (500 mg total) by mouth 3  (three) times daily. 10/25/14   Burgess Amor, PA-C  Aspirin-Acetaminophen-Caffeine (GOODY HEADACHE PO) Take 1 packet by mouth daily as needed.    Historical Provider, MD  famotidine (PEPCID) 20 MG tablet Take 1 tablet (20 mg total) by mouth 2 (two) times daily. 01/24/13   Vanetta Mulders, MD  guaiFENesin-codeine (ROBITUSSIN AC) 100-10 MG/5ML syrup Take 10 mLs by mouth 3 (three) times daily as needed. 09/09/14   Tammy Triplett, PA-C  HYDROcodone-acetaminophen (NORCO/VICODIN) 5-325 MG per tablet Take 1 tablet by mouth every 6 (six) hours as needed. 11/12/14   Kristen N Ward, DO  ibuprofen (ADVIL,MOTRIN) 800 MG tablet Take 1 tablet (800 mg total) by mouth every 8 (eight) hours as needed for mild pain. 11/12/14   Kristen N Ward, DO  omeprazole (PRILOSEC) 40 MG capsule Take 1 capsule (40 mg total) by mouth daily. 07/01/13   [name], MD  oseltamivir (TAMIFLU) 75 MG capsule Take 1 capsule (75 mg total) by mouth 2 (two) times daily. For 5 days 09/09/14   Tammy Triplett, PA-C  penicillin v potassium (VEETID) 500 MG tablet Take 1 tablet (500 mg total) by mouth 4 (four) times daily. 11/12/14   Kristen N Ward, DO  promethazine (PHENERGAN) 25 MG tablet Take 1 tablet (25 mg total) by mouth every 6 (six) hours as needed for nausea. 01/24/13   Vanetta Mulders, MD  sucralfate (CARAFATE) 1 G tablet Take 1 tablet (1 g total)  by mouth 4 (four) times daily -  with meals and at bedtime. 07/01/13   [name], MD  traMADol (ULTRAM) 50 MG tablet Take 1 tablet (50 mg total) by mouth every 6 (six) hours as needed. 10/25/14   Burgess AmorJulie Idol, PA-C   BP 134/91 mmHg  Pulse 92  Temp(Src) 97.6 F (36.4 C) (Oral)  Resp 18  Ht 6\' 2"  (1.88 m)  Wt 350 lb (158.759 kg)  BMI 44.92 kg/m2  SpO2 98% Physical Exam  Constitutional: He appears well-developed and well-nourished. No distress.  HENT:  Head: Normocephalic.  Mouth/Throat: Uvula is midline, oropharynx is clear and moist and mucous membranes are normal. No trismus in the jaw. Abnormal  dentition. Dental caries present. No dental abscesses.  No facial swelling, lip swelling, tongue swelling; no drooling; patent airway; multiple old fractured teeth; partial wisdom tooth exposure on the upper right; TTP of the right upper teeth but without abscess or fluctuance.   Eyes: Conjunctivae are normal. Right eye exhibits no discharge. Left eye exhibits no discharge. No scleral icterus.  Neck: No JVD present.  Pulmonary/Chest: Effort normal. No respiratory distress.  Neurological: He is alert. Coordination normal.  Skin: Skin is warm. No rash noted. No erythema. No pallor.  Psychiatric: He has a normal mood and affect. His behavior is normal.  Nursing note and vitals reviewed.   ED Course  Procedures  DIAGNOSTIC STUDIES: Oxygen Saturation is 98% on RA, normal by my interpretation.    COORDINATION OF CARE: 1:25 PM Discussed treatment plan which includes to order pain medication with pt. Pt acknowledges and agrees to plan.   Labs Review Labs Reviewed - No data to display  Imaging Review No results found.   EKG Interpretation None      MDM   Final diagnoses:  Chronic dental pain   Patient presents for chronic dental pain. He has no notable abscess or signs of Ludwig's angina. He was seen by an oral surgeon yesterday who did not prescribe him pain medications and refused to prescribe him this until after his dental extractions. He presents today for continued pain. I discussed that I would not give him any narcotic pain medications to go home with and that he could take ibuprofen for pain until he was followed by his dental surgeon. Patient understood the plan and no concerns were addressed. Medications  oxyCODONE-acetaminophen (PERCOCET/ROXICET) 5-325 MG per tablet 2 tablet (2 tablets Oral Given 12/28/14 1332)   I personally performed the services described in this documentation, which was scribed in my presence. The recorded information has been reviewed and is  accurate.    Catha GosselinHanna Patel-Mills, PA-C 12/28/14 1615  Purvis SheffieldForrest Harrison, MD 12/29/14 [phone]

## 2015-03-09 ENCOUNTER — Emergency Department (HOSPITAL_COMMUNITY): Payer: Medicaid Other

## 2015-03-09 ENCOUNTER — Encounter (HOSPITAL_COMMUNITY): Payer: Self-pay

## 2015-03-09 ENCOUNTER — Emergency Department (HOSPITAL_COMMUNITY)
Admission: EM | Admit: 2015-03-09 | Discharge: 2015-03-09 | Disposition: A | Discharge status: Home / Self Care | Payer: Medicaid Other | Attending: Emergency Medicine | Admitting: Emergency Medicine

## 2015-03-09 DIAGNOSIS — Z720 Tobacco use: Secondary | ICD-10-CM | POA: Diagnosis not present

## 2015-03-09 DIAGNOSIS — Z8659 Personal history of other mental and behavioral disorders: Secondary | ICD-10-CM | POA: Insufficient documentation

## 2015-03-09 DIAGNOSIS — J3489 Other specified disorders of nose and nasal sinuses: Secondary | ICD-10-CM | POA: Insufficient documentation

## 2015-03-09 DIAGNOSIS — Z8669 Personal history of other diseases of the nervous system and sense organs: Secondary | ICD-10-CM | POA: Insufficient documentation

## 2015-03-09 DIAGNOSIS — R0981 Nasal congestion: Secondary | ICD-10-CM | POA: Diagnosis present

## 2015-03-09 DIAGNOSIS — J069 Acute upper respiratory infection, unspecified: Secondary | ICD-10-CM | POA: Insufficient documentation

## 2015-03-09 MED ORDER — BENZONATATE 100 MG PO CAPS: 200.0000 mg | ORAL_CAPSULE | Freq: Three times a day (TID) | ORAL | Status: DC | PRN

## 2015-03-09 MED ORDER — CETIRIZINE-PSEUDOEPHEDRINE ER 5-120 MG PO TB12: 1.0000 | ORAL_TABLET | Freq: Two times a day (BID) | ORAL | Status: DC

## 2015-03-09 NOTE — ED Notes (Signed)
Pt c/o cold symptoms x 3 days.  Denies fever.

## 2015-03-09 NOTE — Discharge Instructions (Signed)
Upper Respiratory Infection, Adult An upper respiratory infection (URI) is also sometimes known as the common cold. The upper respiratory tract includes the nose, sinuses, throat, trachea, and bronchi. Bronchi are the airways leading to the lungs. Most people improve within 1 week, but symptoms can last up to 2 weeks. A residual cough may last even longer.  CAUSES Many different viruses can infect the tissues lining the upper respiratory tract. The tissues become irritated and inflamed and often become very moist. Mucus production is also common. A cold is contagious. You can easily spread the virus to others by oral contact. This includes kissing, sharing a glass, coughing, or sneezing. Touching your mouth or nose and then touching a surface, which is then touched by another person, can also spread the virus. SYMPTOMS  Symptoms typically develop 1 to 3 days after you come in contact with a cold virus. Symptoms vary from person to person. They may include:  Runny nose.  Sneezing.  Nasal congestion.  Sinus irritation.  Sore throat.  Loss of voice (laryngitis).  Cough.  Fatigue.  Muscle aches.  Loss of appetite.  Headache.  Low-grade fever. DIAGNOSIS  You might diagnose your own cold based on familiar symptoms, since most people get a cold 2 to 3 times a year. Your caregiver can confirm this based on your exam. Most importantly, your caregiver can check that your symptoms are not due to another disease such as strep throat, sinusitis, pneumonia, asthma, or epiglottitis. Blood tests, throat tests, and X-rays are not necessary to diagnose a common cold, but they may sometimes be helpful in excluding other more serious diseases. Your caregiver will decide if any further tests are required. RISKS AND COMPLICATIONS  You may be at risk for a more severe case of the common cold if you smoke cigarettes, have chronic heart disease (such as heart failure) or lung disease (such as asthma), or if  you have a weakened immune system. The very young and very old are also at risk for more serious infections. Bacterial sinusitis, middle ear infections, and bacterial pneumonia can complicate the common cold. The common cold can worsen asthma and chronic obstructive pulmonary disease (COPD). Sometimes, these complications can require emergency medical care and may be life-threatening. PREVENTION  The best way to protect against getting a cold is to practice good hygiene. Avoid oral or hand contact with people with cold symptoms. Wash your hands often if contact occurs. There is no clear evidence that vitamin C, vitamin E, echinacea, or exercise reduces the chance of developing a cold. However, it is always recommended to get plenty of rest and practice good nutrition. TREATMENT  Treatment is directed at relieving symptoms. There is no cure. Antibiotics are not effective, because the infection is caused by a virus, not by bacteria. Treatment may include:  Increased fluid intake. Sports drinks offer valuable electrolytes, sugars, and fluids.  Breathing heated mist or steam (vaporizer or shower).  Eating chicken soup or other clear broths, and maintaining good nutrition.  Getting plenty of rest.  Using gargles or lozenges for comfort.  Controlling fevers with ibuprofen or acetaminophen as directed by your caregiver.  Increasing usage of your inhaler if you have asthma. Zinc gel and zinc lozenges, taken in the first 24 hours of the common cold, can shorten the duration and lessen the severity of symptoms. Pain medicines may help with fever, muscle aches, and throat pain. A variety of non-prescription medicines are available to treat congestion and runny nose. Your caregiver   can make recommendations and may suggest nasal or lung inhalers for other symptoms.  HOME CARE INSTRUCTIONS   Only take over-the-counter or prescription medicines for pain, discomfort, or fever as directed by your  caregiver.  Use a warm mist humidifier or inhale steam from a shower to increase air moisture. This may keep secretions moist and make it easier to breathe.  Drink enough water and fluids to keep your urine clear or pale yellow.  Rest as needed.  Return to work when your temperature has returned to normal or as your caregiver advises. You may need to stay home longer to avoid infecting others. You can also use a face mask and careful hand washing to prevent spread of the virus. SEEK MEDICAL CARE IF:   After the first few days, you feel you are getting worse rather than better.  You need your caregiver's advice about medicines to control symptoms.  You develop chills, worsening shortness of breath, or brown or red sputum. These may be signs of pneumonia.  You develop yellow or brown nasal discharge or pain in the face, especially when you bend forward. These may be signs of sinusitis.  You develop a fever, swollen neck glands, pain with swallowing, or white areas in the back of your throat. These may be signs of strep throat. SEEK IMMEDIATE MEDICAL CARE IF:   You have a fever.  You develop severe or persistent headache, ear pain, sinus pain, or chest pain.  You develop wheezing, a prolonged cough, cough up blood, or have a change in your usual mucus (if you have chronic lung disease).  You develop sore muscles or a stiff neck. Document Released: 11/21/2000 Document Revised: 08/20/2011 Document Reviewed: 09/02/2013 [employer] Patient Information 2015 [employer]. This information is not intended to replace advice given to you by your health care provider. Make sure you discuss any questions you have with your health care provider.  

## 2015-03-10 NOTE — ED Provider Notes (Signed)
CSN: [id number]     Arrival date & time 03/09/15  1015 History   First MD Initiated Contact with Patient 03/09/15 1035     Chief Complaint  Patient presents with  . URI     (Consider location/radiation/quality/duration/timing/severity/associated sxs/prior Treatment) The history is provided by the patient and a parent.   Ian Gonzalez is a 29 y.o. male presenting with a 3 day history of uri type symptoms which includes nasal congestion with clear rhinorrhea,  Mild sore throat, subjective fever and nonproductive cough. He developed mid sternal chest pain yesterday which is worsened with cough and palpation and described as soreness.  Symptoms due to not include shortness of breath, wheezing,  Nausea, vomiting or diarrhea.  The patient has taken no medicines prior to arrival for his symptoms.     Past Medical History  Diagnosis Date  . Bipolar 1 disorder   . Depression   . Sleep disorder    History reviewed. No pertinent past surgical history. No family history on file. Social History  Substance Use Topics  . Smoking status: Current Every Day Smoker -- 1.00 packs/day for 10 years    Types: Cigarettes  . Smokeless tobacco: None  . Alcohol Use: Yes     Comment: Occassionally     Review of Systems  Constitutional: Positive for fever. Negative for chills.  HENT: Positive for congestion, rhinorrhea and sore throat. Negative for ear pain, sinus pressure, trouble swallowing and voice change.   Eyes: Negative for discharge.  Respiratory: Positive for cough. Negative for shortness of breath, wheezing and stridor.   Cardiovascular: Positive for chest pain.  Gastrointestinal: Negative for nausea, vomiting and abdominal pain.  Genitourinary: Negative.       Allergies  Review of patient's allergies indicates no known allergies.  Home Medications   Prior to Admission medications   Medication Sig Start Date End Date Taking? Authorizing Provider  amoxicillin (AMOXIL) 500 MG capsule  Take 1 capsule (500 mg total) by mouth 3 (three) times daily. Patient not taking: Reported on 03/09/2015 10/25/14   Burgess Amor, PA-C  Aspirin-Acetaminophen-Caffeine (GOODY HEADACHE PO) Take 1 packet by mouth daily as needed (pain).     Historical Provider, MD  benzonatate (TESSALON) 100 MG capsule Take 2 capsules (200 mg total) by mouth 3 (three) times daily as needed. 03/09/15   Burgess Amor, PA-C  cetirizine-pseudoephedrine (ZYRTEC-D) 5-120 MG tablet Take 1 tablet by mouth 2 (two) times daily. 03/09/15   Burgess Amor, PA-C  famotidine (PEPCID) 20 MG tablet Take 1 tablet (20 mg total) by mouth 2 (two) times daily. Patient not taking: Reported on 03/09/2015 01/24/13   Vanetta Mulders, MD  guaiFENesin-codeine [facility]) 100-10 MG/5ML syrup Take 10 mLs by mouth 3 (three) times daily as needed. Patient not taking: Reported on 03/09/2015 09/09/14   Tammy Triplett, PA-C  HYDROcodone-acetaminophen (NORCO/VICODIN) 5-325 MG per tablet Take 1 tablet by mouth every 6 (six) hours as needed. Patient not taking: Reported on 03/09/2015 11/12/14   Layla Maw Ward, DO  ibuprofen (ADVIL,MOTRIN) 800 MG tablet Take 1 tablet (800 mg total) by mouth every 8 (eight) hours as needed for mild pain. Patient not taking: Reported on 03/09/2015 11/12/14   Layla Maw Ward, DO  omeprazole (PRILOSEC) 40 MG capsule Take 1 capsule (40 mg total) by mouth daily. Patient not taking: Reported on 03/09/2015 07/01/13   [name], MD  oseltamivir (TAMIFLU) 75 MG capsule Take 1 capsule (75 mg total) by mouth 2 (two) times daily. For 5 days  Patient not taking: Reported on 03/09/2015 09/09/14   Tammy Triplett, PA-C  penicillin v potassium (VEETID) 500 MG tablet Take 1 tablet (500 mg total) by mouth 4 (four) times daily. Patient not taking: Reported on 03/09/2015 11/12/14   Layla Maw Ward, DO  promethazine (PHENERGAN) 25 MG tablet Take 1 tablet (25 mg total) by mouth every 6 (six) hours as needed for nausea. Patient not taking: Reported on 03/09/2015 01/24/13    Vanetta Mulders, MD  sucralfate (CARAFATE) 1 G tablet Take 1 tablet (1 g total) by mouth 4 (four) times daily -  with meals and at bedtime. Patient not taking: Reported on 03/09/2015 07/01/13   [name], MD  traMADol (ULTRAM) 50 MG tablet Take 1 tablet (50 mg total) by mouth every 6 (six) hours as needed. Patient not taking: Reported on 03/09/2015 10/25/14   Burgess Amor, PA-C   BP 136/84 mmHg  Pulse 82  Temp(Src) 98.8 F (37.1 C) (Oral)  Resp 18  Ht  (1.88 m)  Wt 350 lb (158.759 kg)  BMI 44.92 kg/m2  SpO2 99% Physical Exam  Constitutional: He is oriented to person, place, and time. He appears well-developed and well-nourished.  HENT:  Head: Normocephalic and atraumatic.  Right Ear: Tympanic membrane and ear canal normal.  Left Ear: Tympanic membrane and ear canal normal.  Nose: Mucosal edema and rhinorrhea present.  Mouth/Throat: Uvula is midline, oropharynx is clear and moist and mucous membranes are normal. No oropharyngeal exudate, posterior oropharyngeal edema, posterior oropharyngeal erythema or tonsillar abscesses.  Eyes: Conjunctivae are normal.  Neck: Neck supple.  Cardiovascular: Normal rate and normal heart sounds.   Pulmonary/Chest: Effort normal. No respiratory distress. He has no wheezes. He has no rales. He exhibits tenderness.  ttp anterior bilateral ribs.  Abdominal: Soft. There is no tenderness.  Musculoskeletal: Normal range of motion.  Neurological: He is alert and oriented to person, place, and time.  Skin: Skin is warm and dry. No rash noted.  Psychiatric: He has a normal mood and affect.    ED Course  Procedures (including critical care time) Labs Review Labs Reviewed - No data to display  Imaging Review Dg Chest 2 View  03/09/2015   CLINICAL DATA:  Cold symptoms for 3 days.  EXAM: CHEST  2 VIEW  COMPARISON:  01/24/2013  FINDINGS: The heart size and mediastinal contours are within normal limits. Both lungs are clear. The visualized skeletal  structures are unremarkable.  IMPRESSION: No active cardiopulmonary disease.   Electronically Signed   By: Charlett Nose M.D.   On: 03/09/2015 10:41   I have personally reviewed and evaluated these images and lab results as part of my medical decision-making.   EKG Interpretation None      MDM   Final diagnoses:  Acute URI    Pt encouraged rest, increased fluid intake, prescribed zyrtec d and tessalon for sx relief.  Advised prn f/u for any worsened sx including sob, persistent fever, weakness.  xrays negative for pulmonary infection.    Burgess Amor, PA-C 03/10/15 1610  Blane Ohara, MD 03/10/15 [phone]

## 2015-03-11 ENCOUNTER — Encounter (HOSPITAL_COMMUNITY): Payer: Self-pay | Admitting: *Deleted

## 2015-03-11 ENCOUNTER — Emergency Department (HOSPITAL_COMMUNITY)
Admission: EM | Admit: 2015-03-11 | Discharge: 2015-03-11 | Disposition: A | Discharge status: Home / Self Care | Payer: Medicaid Other | Attending: Emergency Medicine | Admitting: Emergency Medicine

## 2015-03-11 ENCOUNTER — Emergency Department (HOSPITAL_COMMUNITY): Payer: Medicaid Other

## 2015-03-11 DIAGNOSIS — R110 Nausea: Secondary | ICD-10-CM | POA: Diagnosis not present

## 2015-03-11 DIAGNOSIS — Z8669 Personal history of other diseases of the nervous system and sense organs: Secondary | ICD-10-CM | POA: Insufficient documentation

## 2015-03-11 DIAGNOSIS — R1011 Right upper quadrant pain: Secondary | ICD-10-CM | POA: Diagnosis not present

## 2015-03-11 DIAGNOSIS — R1013 Epigastric pain: Secondary | ICD-10-CM | POA: Diagnosis not present

## 2015-03-11 DIAGNOSIS — F329 Major depressive disorder, single episode, unspecified: Secondary | ICD-10-CM | POA: Insufficient documentation

## 2015-03-11 DIAGNOSIS — E669 Obesity, unspecified: Secondary | ICD-10-CM | POA: Insufficient documentation

## 2015-03-11 DIAGNOSIS — Z720 Tobacco use: Secondary | ICD-10-CM | POA: Insufficient documentation

## 2015-03-11 DIAGNOSIS — Z79899 Other long term (current) drug therapy: Secondary | ICD-10-CM | POA: Diagnosis not present

## 2015-03-11 DIAGNOSIS — R109 Unspecified abdominal pain: Secondary | ICD-10-CM

## 2015-03-11 LAB — COMPREHENSIVE METABOLIC PANEL
ALBUMIN: 3.8 g/dL (ref 3.5–5.0)
ALT: 16 U/L — AB (ref 17–63)
AST: 15 U/L (ref 15–41)
Alkaline Phosphatase: 79 U/L (ref 38–126)
Anion gap: 7 (ref 5–15)
BUN: 11 mg/dL (ref 6–20)
CO2: 25 mmol/L (ref 22–32)
CREATININE: 0.65 mg/dL (ref 0.61–1.24)
Calcium: 9 mg/dL (ref 8.9–10.3)
Chloride: 103 mmol/L (ref 101–111)
GFR calc Af Amer: >60 mL/min (ref >60)
GLUCOSE: 111 mg/dL — AB (ref 65–99)
POTASSIUM: 3.9 mmol/L (ref 3.5–5.1)
Sodium: 135 mmol/L (ref 135–145)
Total Bilirubin: 0.5 mg/dL (ref 0.3–1.2)
Total Protein: 7.3 g/dL (ref 6.5–8.1)

## 2015-03-11 LAB — CBC WITH DIFFERENTIAL/PLATELET
BASOS ABS: 0.1 10*3/uL (ref 0.0–0.1)
BASOS PCT: 0 %
Eosinophils Absolute: 0.2 10*3/uL (ref 0.0–0.7)
Eosinophils Relative: 1 %
HEMATOCRIT: 44.6 % (ref 39.0–52.0)
Hemoglobin: 14.7 g/dL (ref 13.0–17.0)
LYMPHS PCT: 13 %
Lymphs Abs: 2 10*3/uL (ref 0.7–4.0)
MCH: 27.6 pg (ref 26.0–34.0)
MCHC: 33 g/dL (ref 30.0–36.0)
MCV: 83.8 fL (ref 78.0–100.0)
Monocytes Absolute: 0.6 10*3/uL (ref 0.1–1.0)
Monocytes Relative: 4 %
NEUTROS ABS: 12 10*3/uL — AB (ref 1.7–7.7)
NEUTROS PCT: 82 %
Platelets: 279 10*3/uL (ref 150–400)
RBC: 5.32 MIL/uL (ref 4.22–5.81)
RDW: 13.5 % (ref 11.5–15.5)
WBC: 14.9 10*3/uL — AB (ref 4.0–10.5)

## 2015-03-11 LAB — LIPASE, BLOOD: LIPASE: 21 U/L — AB (ref 22–51)

## 2015-03-11 MED ORDER — PANTOPRAZOLE SODIUM 40 MG IV SOLR
40.0000 mg | Freq: Once | INTRAVENOUS | Status: AC
  Administered 2015-03-11: 40 mg via INTRAVENOUS
  Filled 2015-03-11: qty 40

## 2015-03-11 MED ORDER — HYDROCODONE-ACETAMINOPHEN 5-325 MG PO TABS: 1.0000 | ORAL_TABLET | Freq: Four times a day (QID) | ORAL | Status: DC | PRN

## 2015-03-11 MED ORDER — ONDANSETRON HCL 4 MG/2ML IJ SOLN
4.0000 mg | Freq: Once | INTRAMUSCULAR | Status: AC
  Administered 2015-03-11: 4 mg via INTRAVENOUS
  Filled 2015-03-11: qty 2

## 2015-03-11 NOTE — Discharge Instructions (Signed)
Follow up with dr. Lovell Sheehan next week.  Return if problems this weekend

## 2015-03-11 NOTE — ED Provider Notes (Signed)
CSN: [id number]     Arrival date & time 03/11/15  1000 History  By signing my name below, I, Freida Busman, attest that this documentation has been prepared under the direction and in the presence of Bethann Berkshire, MD . Electronically Signed: Freida Busman, Scribe. 03/11/2015. 11:26 AM.  Chief Complaint  Patient presents with  . Abdominal Pain    Patient is a 29 y.o. male presenting with abdominal pain. The history is provided by the patient. No language interpreter was used.  Abdominal Pain Pain location:  Epigastric and RUQ Pain quality: burning and sharp   Pain radiates to:  Back Pain severity:  Moderate Timing:  Constant Progression:  Waxing and waning Chronicity:  New Relieved by:  Liquids (milk) Associated symptoms: nausea   Associated symptoms: no chest pain, no chills, no cough, no diarrhea, no fatigue, no hematuria and no vomiting   Risk factors: obesity      HPI Comments:  Ian Gonzalez is a 29 y.o. male who presents to the Emergency Department complaining of epigastric and RUQ abdominal pain that started ~0600 this am. He describes  a burning sensation in his epigastrium and a sharp pain in his RUQ. Pt notes the burning sensation improved after a glass of milk. He reports associated mild nausea. He denies a  h/o similar abdominal pain. Pt notes family history of cholecystectomy--Grandfather. He also denies fever, chill, vomiting and diarrhea.   Past Medical History  Diagnosis Date  . Bipolar 1 disorder   . Depression   . Sleep disorder    History reviewed. No pertinent past surgical history. History reviewed. No pertinent family history. Social History  Substance Use Topics  . Smoking status: Current Every Day Smoker -- 1.00 packs/day for 10 years    Types: Cigarettes  . Smokeless tobacco: None  . Alcohol Use: Yes     Comment: Occassionally     Review of Systems  Constitutional: Negative for chills, appetite change and fatigue.  HENT: Negative for congestion,  ear discharge and sinus pressure.   Eyes: Negative for discharge.  Respiratory: Negative for cough.   Cardiovascular: Negative for chest pain.  Gastrointestinal: Positive for nausea and abdominal pain. Negative for vomiting and diarrhea.  Genitourinary: Negative for frequency and hematuria.  Musculoskeletal: Negative for back pain.  Skin: Negative for rash.  Neurological: Negative for seizures and headaches.  Psychiatric/Behavioral: Negative for hallucinations.  All other systems reviewed and are negative.   Allergies  Review of patient's allergies indicates no known allergies.  Home Medications   Prior to Admission medications   Medication Sig Start Date End Date Taking? Authorizing Provider  benzonatate (TESSALON) 100 MG capsule Take 2 capsules (200 mg total) by mouth 3 (three) times daily as needed. 03/09/15  Yes Raynelle Fanning Idol, PA-C  cetirizine-pseudoephedrine (ZYRTEC-D) 5-120 MG tablet Take 1 tablet by mouth 2 (two) times daily. 03/09/15  Yes Raynelle Fanning Idol, PA-C  amoxicillin (AMOXIL) 500 MG capsule Take 1 capsule (500 mg total) by mouth 3 (three) times daily. Patient not taking: Reported on 03/09/2015 10/25/14   Burgess Amor, PA-C  ibuprofen (ADVIL,MOTRIN) 800 MG tablet Take 1 tablet (800 mg total) by mouth every 8 (eight) hours as needed for mild pain. Patient not taking: Reported on 03/09/2015 11/12/14   Layla Maw Ward, DO  penicillin v potassium (VEETID) 500 MG tablet Take 1 tablet (500 mg total) by mouth 4 (four) times daily. Patient not taking: Reported on 03/09/2015 11/12/14   Layla Maw Ward, DO  traMADol [facility]) 50  MG tablet Take 1 tablet (50 mg total) by mouth every 6 (six) hours as needed. Patient not taking: Reported on 03/09/2015 10/25/14   Burgess Amor, PA-C   BP 148/113 mmHg  Pulse 79  Temp(Src) 97.8 F (36.6 C) (Oral)  Resp 18  Ht  (1.88 m)  Wt 350 lb (158.759 kg)  BMI 44.92 kg/m2  SpO2 97% Physical Exam  Constitutional: He is oriented to person, place, and time. He appears  well-developed.  HENT:  Head: Normocephalic.  Eyes: Conjunctivae and EOM are normal. No scleral icterus.  Neck: Neck supple. No thyromegaly present.  Cardiovascular: Normal rate and regular rhythm.  Exam reveals no gallop and no friction rub.   No murmur heard. Pulmonary/Chest: No stridor. He has no wheezes. He has no rales. He exhibits no tenderness.  Abdominal: Soft. He exhibits no distension. There is tenderness. There is no rebound.  Moderate epigastric and RUQ TTP  Musculoskeletal: Normal range of motion. He exhibits no edema.  Lymphadenopathy:    He has no cervical adenopathy.  Neurological: He is oriented to person, place, and time. He exhibits normal muscle tone. Coordination normal.  Skin: No rash noted. No erythema.  Psychiatric: He has a normal mood and affect. His behavior is normal.  Nursing note and vitals reviewed.   ED Course  Procedures   DIAGNOSTIC STUDIES:  Oxygen Saturation is 97% on RA, normal by my interpretation.    COORDINATION OF CARE:  11:17 AM Will order abdominal US and nausea meds,  Discussed treatment plan with pt at bedside and pt agreed to plan.  Labs Review Labs Reviewed  CBC WITH DIFFERENTIAL/PLATELET  COMPREHENSIVE METABOLIC PANEL  LIPASE, BLOOD    Imaging Review No results found. I have personally reviewed and evaluated these images and lab results as part of my medical decision-making.   EKG Interpretation None      MDM   Final diagnoses:  None    Patient had one large gallstone in his gallbladder neck. Patient did not have signs of cholecystitis. I spoke with Dr. Lovell Sheehan general surgery and he will follow-up with the patient next week patient will be given prescription for Vicodin.The chart was scribed for me under my direct supervision.  I personally performed the history, physical, and medical decision making and all procedures in the evaluation of this patient.Bethann Berkshire, MD 03/11/15 [phone]

## 2015-03-11 NOTE — ED Notes (Signed)
MD at bedside. 

## 2015-03-11 NOTE — ED Notes (Signed)
Patient c/o abd pain upon waking this morning. Some nausea, denies v/d. Also reports back pain and can't "get comfortable". Denies fevers.

## 2015-03-15 NOTE — H&P (Signed)
  NTS SOAP Note  Vital Signs:  Vitals as of: 03/15/2015: Systolic 148: Diastolic 95: Heart Rate 89: Temp 97.9F: Height 87ft 2in: Weight 360Lbs 0 Ounces: BMI 46.22  BMI : 46.22 kg/m2  Subjective: This 29 year old male presents for of gallstone.  Was seen in ER recently for right upper quadrant abdominal pain, found on u/s to have cholelithiasis, normal common bile duct.  First episode.  Currently feels fine.  No fever, chills, jaundice.  Review of Symptoms:  Constitutional:unremarkable   Head:unremarkable Eyes:unremarkable   Nose/Mouth/Throat:unremarkable Cardiovascular:  unremarkable Respiratory:unremarkable Genitourinary:unremarkable   Musculoskeletal:unremarkable Skin:unremarkable Hematolgic/Lymphatic:unremarkable   Allergic/Immunologic:unremarkable   Past Medical History:  Reviewed  Past Medical History  Surgical History: none Medical Problems: bipolar disorder Allergies: nkda Medications: tessalon, zyrtec, ultram   Social History:Reviewed  Social History  Preferred Language: English Race:  White Ethnicity: Not Hispanic / Latino Age: 29 year Marital Status:  M   Smoking Status: Current every day smoker reviewed on 03/15/2015 Started Date:  Packs per day: 1.00 Functional Status reviewed on 03/15/2015 ------------------------------------------------ Bathing: Normal Cooking: Normal Dressing: Normal Driving: Normal Eating: Normal Managing Meds: Normal Oral Care: Normal Shopping: Normal Toileting: Normal Transferring: Normal Walking: Normal Cognitive Status reviewed on 03/15/2015 ------------------------------------------------ Attention: Normal Decision Making: Normal Language: Normal Memory: Normal Motor: Normal Perception: Normal Problem Solving: Normal Visual and Spatial: Normal   Family History:Reviewed  Family Health History Mother  Father, Deceased; Cancer unspecified;     Objective Information: General:Well  appearing, well nourished in no distress. no scleral icterus Heart:RRR, no murmur or gallop.  Normal S1, S2.  No S3, S4.  Lungs:  CTA bilaterally, no wheezes, rhonchi, rales.  Breathing unlabored. Abdomen:Soft, NT/ND, no HSM, no masses.  Assessment:Cholelithiasis  Diagnoses: 574.20  K80.20 Gallstone (Calculus of gallbladder without cholecystitis without obstruction)  Procedures: 16109 - OFFICE OUTPATIENT NEW 30 MINUTES    Plan:  Scheduled for laparoscopic cholecystectomy on 03/18/15.   Patient Education:Alternative treatments to surgery were discussed with patient (and family).  Risks and benefits  of procedure including bleeding, infection, hepatobiliary injury, and the possibility of an open procedure were fully explained to the patient (and family) who gave informed consent. Patient/family questions were addressed.  Follow-up:Pending Surgery

## 2015-03-16 ENCOUNTER — Encounter (HOSPITAL_COMMUNITY)
Admission: RE | Admit: 2015-03-16 | Discharge: 2015-03-16 | Disposition: A | Discharge status: Home / Self Care | Payer: Medicaid Other | Source: Ambulatory Visit | Attending: General Surgery | Admitting: General Surgery

## 2015-03-18 ENCOUNTER — Encounter (HOSPITAL_COMMUNITY)
Admission: RE | Disposition: A | Discharge status: Home / Self Care | Payer: Self-pay | Source: Ambulatory Visit | Attending: General Surgery

## 2015-03-18 ENCOUNTER — Encounter (HOSPITAL_COMMUNITY): Payer: Self-pay | Admitting: *Deleted

## 2015-03-18 ENCOUNTER — Ambulatory Visit (HOSPITAL_COMMUNITY): Payer: Medicaid Other | Admitting: Anesthesiology

## 2015-03-18 ENCOUNTER — Ambulatory Visit (HOSPITAL_COMMUNITY)
Admission: RE | Admit: 2015-03-18 | Discharge: 2015-03-18 | Disposition: A | Discharge status: Home / Self Care | Payer: Medicaid Other | Source: Ambulatory Visit | Attending: General Surgery | Admitting: General Surgery

## 2015-03-18 DIAGNOSIS — Z79899 Other long term (current) drug therapy: Secondary | ICD-10-CM | POA: Diagnosis not present

## 2015-03-18 DIAGNOSIS — Z6841 Body Mass Index (BMI) 40.0 and over, adult: Secondary | ICD-10-CM | POA: Diagnosis not present

## 2015-03-18 DIAGNOSIS — F17200 Nicotine dependence, unspecified, uncomplicated: Secondary | ICD-10-CM | POA: Diagnosis not present

## 2015-03-18 DIAGNOSIS — F319 Bipolar disorder, unspecified: Secondary | ICD-10-CM | POA: Insufficient documentation

## 2015-03-18 DIAGNOSIS — K8010 Calculus of gallbladder with chronic cholecystitis without obstruction: Secondary | ICD-10-CM | POA: Insufficient documentation

## 2015-03-18 HISTORY — PX: CHOLECYSTECTOMY: SHX55

## 2015-03-18 SURGERY — LAPAROSCOPIC CHOLECYSTECTOMY
Anesthesia: General

## 2015-03-18 MED ORDER — FENTANYL CITRATE (PF) 100 MCG/2ML IJ SOLN
INTRAMUSCULAR | Status: AC
  Filled 2015-03-18: qty 2

## 2015-03-18 MED ORDER — NEOSTIGMINE METHYLSULFATE 10 MG/10ML IV SOLN
INTRAVENOUS | Status: AC
  Filled 2015-03-18: qty 1

## 2015-03-18 MED ORDER — LIDOCAINE HCL (PF) 1 % IJ SOLN
INTRAMUSCULAR | Status: AC
  Filled 2015-03-18: qty 20

## 2015-03-18 MED ORDER — POVIDONE-IODINE 10 % EX OINT
TOPICAL_OINTMENT | CUTANEOUS | Status: AC
  Filled 2015-03-18: qty 1

## 2015-03-18 MED ORDER — DEXAMETHASONE SODIUM PHOSPHATE 4 MG/ML IJ SOLN
INTRAMUSCULAR | Status: AC
  Filled 2015-03-18: qty 1

## 2015-03-18 MED ORDER — POVIDONE-IODINE 10 % OINT PACKET
TOPICAL_OINTMENT | CUTANEOUS | Status: DC | PRN
  Administered 2015-03-18: 1 via TOPICAL

## 2015-03-18 MED ORDER — ONDANSETRON HCL 4 MG/2ML IJ SOLN
4.0000 mg | Freq: Once | INTRAMUSCULAR | Status: AC | PRN
  Administered 2015-03-18: 4 mg via INTRAVENOUS
  Filled 2015-03-18: qty 2

## 2015-03-18 MED ORDER — SODIUM CHLORIDE 0.9 % IR SOLN
Status: DC | PRN
  Administered 2015-03-18: 1000 mL

## 2015-03-18 MED ORDER — GLYCOPYRROLATE 0.2 MG/ML IJ SOLN
0.2000 mg | Freq: Once | INTRAMUSCULAR | Status: AC
  Administered 2015-03-18: 0.2 mg via INTRAVENOUS

## 2015-03-18 MED ORDER — GLYCOPYRROLATE 0.2 MG/ML IJ SOLN
INTRAMUSCULAR | Status: AC
  Filled 2015-03-18: qty 1

## 2015-03-18 MED ORDER — NEOSTIGMINE METHYLSULFATE 10 MG/10ML IV SOLN
INTRAVENOUS | Status: DC | PRN
  Administered 2015-03-18: 5 mg via INTRAVENOUS

## 2015-03-18 MED ORDER — DEXAMETHASONE SODIUM PHOSPHATE 4 MG/ML IJ SOLN
4.0000 mg | Freq: Once | INTRAMUSCULAR | Status: AC
  Administered 2015-03-18: 4 mg via INTRAVENOUS

## 2015-03-18 MED ORDER — FENTANYL CITRATE (PF) 100 MCG/2ML IJ SOLN
INTRAMUSCULAR | Status: DC | PRN
  Administered 2015-03-18 (×3): 50 ug via INTRAVENOUS
  Administered 2015-03-18: 100 ug via INTRAVENOUS
  Administered 2015-03-18: 50 ug via INTRAVENOUS

## 2015-03-18 MED ORDER — LACTATED RINGERS IV SOLN
INTRAVENOUS | Status: DC
  Administered 2015-03-18: 07:00:00 via INTRAVENOUS

## 2015-03-18 MED ORDER — MIDAZOLAM HCL 2 MG/2ML IJ SOLN
1.0000 mg | INTRAMUSCULAR | Status: DC | PRN
  Administered 2015-03-18 (×2): 2 mg via INTRAVENOUS
  Filled 2015-03-18: qty 2

## 2015-03-18 MED ORDER — LIDOCAINE HCL (CARDIAC) 20 MG/ML IV SOLN
INTRAVENOUS | Status: DC | PRN
  Administered 2015-03-18: 50 mg via INTRAVENOUS

## 2015-03-18 MED ORDER — KETOROLAC TROMETHAMINE 30 MG/ML IJ SOLN
30.0000 mg | Freq: Once | INTRAMUSCULAR | Status: AC
  Administered 2015-03-18: 30 mg via INTRAVENOUS

## 2015-03-18 MED ORDER — GLYCOPYRROLATE 0.2 MG/ML IJ SOLN
INTRAMUSCULAR | Status: DC | PRN
  Administered 2015-03-18: .9 mg via INTRAVENOUS

## 2015-03-18 MED ORDER — PROPOFOL 10 MG/ML IV BOLUS
INTRAVENOUS | Status: DC | PRN
  Administered 2015-03-18 (×2): 50 mg via INTRAVENOUS
  Administered 2015-03-18: 200 mg via INTRAVENOUS
  Administered 2015-03-18: 50 mg via INTRAVENOUS
  Administered 2015-03-18: 30 mg via INTRAVENOUS

## 2015-03-18 MED ORDER — ROCURONIUM BROMIDE 100 MG/10ML IV SOLN
INTRAVENOUS | Status: DC | PRN
  Administered 2015-03-18: 32 mg via INTRAVENOUS
  Administered 2015-03-18: 8 mg via INTRAVENOUS

## 2015-03-18 MED ORDER — FENTANYL CITRATE (PF) 100 MCG/2ML IJ SOLN
25.0000 ug | INTRAMUSCULAR | Status: DC | PRN
  Administered 2015-03-18 (×4): 50 ug via INTRAVENOUS

## 2015-03-18 MED ORDER — CIPROFLOXACIN IN D5W 400 MG/200ML IV SOLN
400.0000 mg | INTRAVENOUS | Status: AC
  Administered 2015-03-18: 400 mg via INTRAVENOUS
  Filled 2015-03-18: qty 200

## 2015-03-18 MED ORDER — PROPOFOL 10 MG/ML IV BOLUS
INTRAVENOUS | Status: AC
  Filled 2015-03-18: qty 20

## 2015-03-18 MED ORDER — SUCCINYLCHOLINE CHLORIDE 20 MG/ML IJ SOLN
INTRAMUSCULAR | Status: DC | PRN
  Administered 2015-03-18: 160 mg via INTRAVENOUS

## 2015-03-18 MED ORDER — KETOROLAC TROMETHAMINE 30 MG/ML IJ SOLN
INTRAMUSCULAR | Status: AC
  Filled 2015-03-18: qty 1

## 2015-03-18 MED ORDER — BUPIVACAINE HCL (PF) 0.5 % IJ SOLN
INTRAMUSCULAR | Status: AC
  Filled 2015-03-18: qty 30

## 2015-03-18 MED ORDER — BUPIVACAINE HCL (PF) 0.5 % IJ SOLN
INTRAMUSCULAR | Status: DC | PRN
  Administered 2015-03-18: 10 mL

## 2015-03-18 MED ORDER — ONDANSETRON HCL 4 MG/2ML IJ SOLN
4.0000 mg | Freq: Once | INTRAMUSCULAR | Status: AC
  Administered 2015-03-18: 4 mg via INTRAVENOUS

## 2015-03-18 MED ORDER — FENTANYL CITRATE (PF) 250 MCG/5ML IJ SOLN
INTRAMUSCULAR | Status: AC
  Filled 2015-03-18: qty 5

## 2015-03-18 MED ORDER — FENTANYL CITRATE (PF) 250 MCG/5ML IJ SOLN
INTRAMUSCULAR | Status: AC
  Filled 2015-03-18: qty 25

## 2015-03-18 MED ORDER — OXYCODONE-ACETAMINOPHEN 7.5-325 MG PO TABS
1.0000 | ORAL_TABLET | ORAL | Status: DC | PRN
Start: 2015-03-18 — End: 2015-03-21

## 2015-03-18 MED ORDER — HEMOSTATIC AGENTS (NO CHARGE) OPTIME
TOPICAL | Status: DC | PRN
  Administered 2015-03-18: 1 via TOPICAL

## 2015-03-18 MED ORDER — ONDANSETRON HCL 4 MG/2ML IJ SOLN
INTRAMUSCULAR | Status: AC
  Filled 2015-03-18: qty 2

## 2015-03-18 MED ORDER — MIDAZOLAM HCL 2 MG/2ML IJ SOLN
INTRAMUSCULAR | Status: AC
  Filled 2015-03-18: qty 2

## 2015-03-18 MED ORDER — SUCCINYLCHOLINE CHLORIDE 20 MG/ML IJ SOLN
INTRAMUSCULAR | Status: AC
  Filled 2015-03-18: qty 3

## 2015-03-18 MED ORDER — GLYCOPYRROLATE 0.2 MG/ML IJ SOLN
INTRAMUSCULAR | Status: AC
  Filled 2015-03-18: qty 4

## 2015-03-18 SURGICAL SUPPLY — 41 items
APPLIER CLIP LAPSCP 10X32 DD (CLIP) ×2 IMPLANT
BAG HAMPER (MISCELLANEOUS) ×2 IMPLANT
BAG SPEC RTRVL LRG 6X4 10 (ENDOMECHANICALS) ×1
CHLORAPREP W/TINT 26ML (MISCELLANEOUS) ×2 IMPLANT
CLOTH BEACON ORANGE TIMEOUT ST (SAFETY) ×2 IMPLANT
COVER LIGHT HANDLE STERIS (MISCELLANEOUS) ×4 IMPLANT
DECANTER SPIKE VIAL GLASS SM (MISCELLANEOUS) ×2 IMPLANT
ELECT REM PT RETURN 9FT ADLT (ELECTROSURGICAL) ×2
ELECTRODE REM PT RTRN 9FT ADLT (ELECTROSURGICAL) ×1 IMPLANT
FILTER SMOKE EVAC LAPAROSHD (FILTER) ×2 IMPLANT
FORMALIN 10 PREFIL 120ML (MISCELLANEOUS) ×2 IMPLANT
GLOVE BIO SURGEON STRL SZ 6.5 (GLOVE) ×4 IMPLANT
GLOVE BIOGEL PI IND STRL 7.0 (GLOVE) ×3 IMPLANT
GLOVE BIOGEL PI INDICATOR 7.0 (GLOVE) ×3
GLOVE SURG SS PI 7.5 STRL IVOR (GLOVE) ×2 IMPLANT
GOWN STRL REUS W/ TWL XL LVL3 (GOWN DISPOSABLE) ×1 IMPLANT
GOWN STRL REUS W/TWL LRG LVL3 (GOWN DISPOSABLE) ×4 IMPLANT
GOWN STRL REUS W/TWL XL LVL3 (GOWN DISPOSABLE) ×2
HEMOSTAT SNOW SURGICEL 2X4 (HEMOSTASIS) ×2 IMPLANT
INST SET LAPROSCOPIC AP (KITS) ×2 IMPLANT
IV NS IRRIG 3000ML ARTHROMATIC (IV SOLUTION) IMPLANT
KIT ROOM TURNOVER APOR (KITS) ×2 IMPLANT
MANIFOLD NEPTUNE II (INSTRUMENTS) ×2 IMPLANT
NEEDLE INSUFFLATION 14GA 120MM (NEEDLE) ×2 IMPLANT
NS IRRIG 1000ML POUR BTL (IV SOLUTION) ×2 IMPLANT
PACK LAP CHOLE LZT030E (CUSTOM PROCEDURE TRAY) ×2 IMPLANT
PAD ARMBOARD 7.5X6 YLW CONV (MISCELLANEOUS) ×2 IMPLANT
POUCH SPECIMEN RETRIEVAL 10MM (ENDOMECHANICALS) ×2 IMPLANT
SET BASIN LINEN APH (SET/KITS/TRAYS/PACK) ×2 IMPLANT
SET TUBE IRRIG SUCTION NO TIP (IRRIGATION / IRRIGATOR) IMPLANT
SLEEVE ENDOPATH XCEL 5M (ENDOMECHANICALS) ×2 IMPLANT
SPONGE GAUZE 2X2 8PLY STRL LF (GAUZE/BANDAGES/DRESSINGS) ×8 IMPLANT
STAPLER VISISTAT (STAPLE) ×2 IMPLANT
SUT VICRYL 0 UR6 27IN ABS (SUTURE) ×4 IMPLANT
TAPE CLOTH SURG 4X10 WHT LF (GAUZE/BANDAGES/DRESSINGS) ×2 IMPLANT
TROCAR ENDO BLADELESS 11MM (ENDOMECHANICALS) ×2 IMPLANT
TROCAR XCEL NON-BLD 5MMX100MML (ENDOMECHANICALS) ×2 IMPLANT
TROCAR XCEL UNIV SLVE 11M 100M (ENDOMECHANICALS) ×2 IMPLANT
TUBING INSUFFLATION (TUBING) ×2 IMPLANT
WARMER LAPAROSCOPE (MISCELLANEOUS) ×2 IMPLANT
YANKAUER SUCT 12FT TUBE ARGYLE (SUCTIONS) ×2 IMPLANT

## 2015-03-18 NOTE — Anesthesia Preprocedure Evaluation (Signed)
Anesthesia Evaluation  Patient identified by MRN, date of birth, ID band Patient awake    Reviewed: Allergy & Precautions, NPO status , Patient's Chart, lab work & pertinent test results  Airway Mallampati: I  TM Distance: >3 FB Neck ROM: Full    Dental  (+) Teeth Intact, Dental Advisory Given   Pulmonary Current Smoker,    breath sounds clear to auscultation       Cardiovascular negative cardio ROS   Rhythm:Regular Rate:Normal     Neuro/Psych PSYCHIATRIC DISORDERS Depression Bipolar Disorder    GI/Hepatic negative GI ROS,   Endo/Other  Morbid obesity  Renal/GU      Musculoskeletal   Abdominal   Peds  Hematology   Anesthesia Other Findings   Reproductive/Obstetrics                             Anesthesia Physical Anesthesia Plan  ASA: II  Anesthesia Plan: General   Post-op Pain Management:    Induction: Intravenous, Rapid sequence and Cricoid pressure planned  Airway Management Planned: Oral ETT  Additional Equipment:   Intra-op Plan:   Post-operative Plan: Extubation in OR  Informed Consent: I have reviewed the patients History and Physical, chart, labs and discussed the procedure including the risks, benefits and alternatives for the proposed anesthesia with the patient or authorized representative who has indicated his/her understanding and acceptance.     Plan Discussed with:   Anesthesia Plan Comments:         Anesthesia Quick Evaluation

## 2015-03-18 NOTE — Interval H&P Note (Signed)
History and Physical Interval Note:  03/18/2015 7:12 AM  Ian Gonzalez  has presented today for surgery, with the diagnosis of cholelithiasis  The various methods of treatment have been discussed with the patient and family. After consideration of risks, benefits and other options for treatment, the patient has consented to  Procedure(s): LAPAROSCOPIC CHOLECYSTECTOMY (N/A) as a surgical intervention .  The patient's history has been reviewed, patient examined, no change in status, stable for surgery.  I have reviewed the patient's chart and labs.  Questions were answered to the patient's satisfaction.     Franky Macho A

## 2015-03-18 NOTE — Discharge Instructions (Signed)
Laparoscopic Cholecystectomy, Care After °Refer to this sheet in the next few weeks. These instructions provide you with information about caring for yourself after your procedure. Your health care provider may also give you more specific instructions. Your treatment has been planned according to current medical practices, but problems sometimes occur. Call your health care provider if you have any problems or questions after your procedure. °WHAT TO EXPECT AFTER THE PROCEDURE °After your procedure, it is common to have: °· Pain at your incision sites. You will be given pain medicines to control your pain. °· Mild nausea or vomiting. This should improve after the first 24 hours. °· Bloating and possible shoulder pain from the gas that was used during the procedure. This will improve after the first 24 hours. °HOME CARE INSTRUCTIONS °Incision Care °· Follow instructions from your health care provider about how to take care of your incisions. Make sure you: °¨ Wash your hands with soap and water before you change your bandage (dressing). If soap and water are not available, use hand sanitizer. °¨ Change your dressing as told by your health care provider. °¨ Leave stitches (sutures), skin glue, or adhesive strips in place. These skin closures may need to be in place for 2 weeks or longer. If adhesive strip edges start to loosen and curl up, you may trim the loose edges. Do not remove adhesive strips completely unless your health care provider tells you to do that. °· Do not take baths, swim, or use a hot tub until your health care provider approves. Ask your health care provider if you can take showers. You may only be allowed to take sponge baths for bathing. °General Instructions °· Take over-the-counter and prescription medicines only as told by your health care provider. °· Do not drive or operate heavy machinery while taking prescription pain medicine. °· Return to your normal diet as told by your health care  provider. °· Do not lift anything that is heavier than 10 lb (4.5 kg). °· Do not play contact sports for one week or until your health care provider approves. °SEEK MEDICAL CARE IF:  °· You have redness, swelling, or pain at the site of your incision. °· You have fluid, blood, or pus coming from your incision. °· You notice a bad smell coming from your incision area. °· Your surgical incisions break open. °· You have a fever. °SEEK IMMEDIATE MEDICAL CARE IF: °· You develop a rash. °· You have difficulty breathing. °· You have chest pain. °· You have increasing pain in your shoulders (shoulder strap areas). °· You faint or have dizzy episodes while you are standing. °· You have severe pain in your abdomen. °· You have nausea or vomiting that lasts for more than one day. °  °This information is not intended to replace advice given to you by your health care provider. Make sure you discuss any questions you have with your health care provider. °  °Document Released: 05/28/2005 Document Revised: 02/16/2015 Document Reviewed: 01/07/2013 °Elsevier Interactive Patient Education ©2016 [employer] ° °

## 2015-03-18 NOTE — Op Note (Signed)
Patient:  Ian Gonzalez  DOB:  [birth year]  MRN:  [id number]   Preop Diagnosis:  Cholecystitis, cholelithiasis  Postop Diagnosis:  Same  Procedure:  Laparoscopic cholecystectomy  Surgeon:  Franky Macho, M.D. general endotracheal  Anes:  Gen. Endotracheal  Indications:  Patient is a 29 year old white male who presents with biliary colic secondary to cholelithiasis. The risks and benefits of the procedure including bleeding, infection, hepatobiliary injury, and the possibility of an open procedure were fully explained to the patient, who gave informed consent.   Procedure note:  Patient was placed in the supine position. After induction of general endotracheal anesthesia, the abdomen was prepped and draped using the usual sterile technique with DuraPrep. Surgical site confirmation was performed.  A supraumbilical incision was made down to the fascia. A Veress needle was introduced into the abdominal cavity and confirmation of placement was done using the saline drop test. The abdomen was then insufflated to 17 mmHg pressure. An 11 mm trocar was introduced into the abdominal cavity under direct visualization without difficulty. The patient was placed in reverse Trendelenburg position and an additional 11 mm trocar was placed the epigastric region and 5 mm trochars were placed the right upper quadrant and right flank regions. Liver was inspected and noted to be within normal limits. The gallbladder was retracted in a dynamic fashion in order to expose the triangle of Calot. The cystic duct was first identified. Its junction to the infundibulum was fully identified. Endoclips were placed proximally and distally on the cystic duct, and cystic duct was divided. This was likewise done to the cystic artery. The gallbladder was freed away from the gallbladder fossa using Bovie cautery. The gallbladder was delivered through the epigastric trocar site using an Endo Catch bag. The gallbladder fossa was  inspected no abnormal bleeding or bile leakage was noted. Surgicel is placed the gallbladder fossa. All fluid and air were then evacuated from the abdominal cavity prior to removal of the trochars.  All wounds were irrigated with normal saline. All wounds were injected with 0.5% Sensorcaine. The suprabuccal fashion as well as epigastric fascia were reapproximated using 0 Vicryl interrupted sutures. All skin incisions were closed using staples. Betadine ointment and dry sterile dressings were applied.  All tape and needle counts were correct at the end of the procedure. Patient was extubated in the operating room and transferred to PACU in stable condition.  Complications:  None  EBL:  Minimal  Specimen:  Gallbladder

## 2015-03-18 NOTE — Anesthesia Postprocedure Evaluation (Signed)
  Anesthesia Post-op Note  Patient: Ian Gonzalez  Procedure(s) Performed: Procedure(s): LAPAROSCOPIC CHOLECYSTECTOMY (N/A)  Patient Location: PACU  Anesthesia Type:General  Level of Consciousness: awake, alert  and oriented  Airway and Oxygen Therapy: Patient Spontanous Breathing  Post-op Pain: mild  Post-op Assessment: Post-op Vital signs reviewed, Patient's Cardiovascular Status Stable, Respiratory Function Stable, Patent Airway and No signs of Nausea or vomiting              Post-op Vital Signs: Reviewed and stable  Last Vitals:  Filed Vitals:   03/18/15 0942  BP: 126/81  Pulse: 84  Temp: 36.6 C  Resp: 18    Complications: No apparent anesthesia complications

## 2015-03-18 NOTE — Anesthesia Procedure Notes (Signed)
Procedure Name: Intubation Date/Time: 03/18/2015 7:45 AM Performed by: Glynn Octave E Pre-anesthesia Checklist: Patient identified, Patient being monitored, Timeout performed, Emergency Drugs available and Suction available Patient Re-evaluated:Patient Re-evaluated prior to inductionOxygen Delivery Method: Circle System Utilized Preoxygenation: Pre-oxygenation with 100% oxygen Intubation Type: IV induction Ventilation: Mask ventilation without difficulty Laryngoscope Size: Mac and 3 Grade View: Grade I Tube type: Oral Tube size: 8.0 mm Number of attempts: 1 Airway Equipment and Method: Stylet Placement Confirmation: ETT inserted through vocal cords under direct vision,  positive ETCO2 and breath sounds checked- equal and bilateral Secured at: 23 cm Tube secured with: Tape Dental Injury: Teeth and Oropharynx as per pre-operative assessment

## 2015-03-18 NOTE — Transfer of Care (Signed)
Immediate Anesthesia Transfer of Care Note  Patient: Ian Gonzalez  Procedure(s) Performed: Procedure(s): LAPAROSCOPIC CHOLECYSTECTOMY (N/A)  Patient Location: PACU  Anesthesia Type:General  Level of Consciousness: awake and alert   Airway & Oxygen Therapy: Patient Spontanous Breathing and Patient connected to face mask oxygen  Post-op Assessment: Report given to RN  Post vital signs: Reviewed and stable  Last Vitals:  Filed Vitals:   03/18/15 0730  BP: 134/69  Pulse:   Temp:   Resp: 34    Complications: No apparent anesthesia complications

## 2015-03-21 ENCOUNTER — Emergency Department (HOSPITAL_COMMUNITY)
Admission: EM | Admit: 2015-03-21 | Discharge: 2015-03-21 | Disposition: A | Discharge status: Home / Self Care | Payer: Medicaid Other | Attending: Emergency Medicine | Admitting: Emergency Medicine

## 2015-03-21 ENCOUNTER — Encounter (HOSPITAL_COMMUNITY): Payer: Self-pay | Admitting: General Surgery

## 2015-03-21 ENCOUNTER — Emergency Department (HOSPITAL_COMMUNITY): Payer: Medicaid Other

## 2015-03-21 DIAGNOSIS — Z8659 Personal history of other mental and behavioral disorders: Secondary | ICD-10-CM | POA: Insufficient documentation

## 2015-03-21 DIAGNOSIS — G8918 Other acute postprocedural pain: Secondary | ICD-10-CM | POA: Insufficient documentation

## 2015-03-21 DIAGNOSIS — Z720 Tobacco use: Secondary | ICD-10-CM | POA: Insufficient documentation

## 2015-03-21 DIAGNOSIS — Z9049 Acquired absence of other specified parts of digestive tract: Secondary | ICD-10-CM | POA: Insufficient documentation

## 2015-03-21 DIAGNOSIS — R1013 Epigastric pain: Secondary | ICD-10-CM | POA: Insufficient documentation

## 2015-03-21 DIAGNOSIS — R1011 Right upper quadrant pain: Secondary | ICD-10-CM | POA: Diagnosis present

## 2015-03-21 LAB — COMPREHENSIVE METABOLIC PANEL
ALT: 20 U/L (ref 17–63)
AST: 25 U/L (ref 15–41)
Albumin: 3.4 g/dL — ABNORMAL LOW (ref 3.5–5.0)
Alkaline Phosphatase: 60 U/L (ref 38–126)
Anion gap: 7 (ref 5–15)
BILIRUBIN TOTAL: 0.8 mg/dL (ref 0.3–1.2)
BUN: 8 mg/dL (ref 6–20)
CHLORIDE: 99 mmol/L — AB (ref 101–111)
CO2: 27 mmol/L (ref 22–32)
CREATININE: 0.61 mg/dL (ref 0.61–1.24)
Calcium: 8.2 mg/dL — ABNORMAL LOW (ref 8.9–10.3)
Glucose, Bld: 156 mg/dL — ABNORMAL HIGH (ref 65–99)
Potassium: 3.5 mmol/L (ref 3.5–5.1)
Sodium: 133 mmol/L — ABNORMAL LOW (ref 135–145)
TOTAL PROTEIN: 6.9 g/dL (ref 6.5–8.1)

## 2015-03-21 LAB — CBC WITH DIFFERENTIAL/PLATELET
BASOS ABS: 0 10*3/uL (ref 0.0–0.1)
BASOS PCT: 0 %
EOS ABS: 0.2 10*3/uL (ref 0.0–0.7)
EOS PCT: 2 %
HCT: 31.5 % — ABNORMAL LOW (ref 39.0–52.0)
Hemoglobin: 10.1 g/dL — ABNORMAL LOW (ref 13.0–17.0)
Lymphocytes Relative: 15 %
Lymphs Abs: 1.7 10*3/uL (ref 0.7–4.0)
MCH: 27.1 pg (ref 26.0–34.0)
MCHC: 32.1 g/dL (ref 30.0–36.0)
MCV: 84.5 fL (ref 78.0–100.0)
MONO ABS: 0.7 10*3/uL (ref 0.1–1.0)
Monocytes Relative: 6 %
Neutro Abs: 9 10*3/uL — ABNORMAL HIGH (ref 1.7–7.7)
Neutrophils Relative %: 77 %
PLATELETS: 250 10*3/uL (ref 150–400)
RBC: 3.73 MIL/uL — AB (ref 4.22–5.81)
RDW: 13.7 % (ref 11.5–15.5)
WBC: 11.6 10*3/uL — AB (ref 4.0–10.5)

## 2015-03-21 LAB — LIPASE, BLOOD: LIPASE: 19 U/L — AB (ref 22–51)

## 2015-03-21 MED ORDER — ONDANSETRON HCL 4 MG/2ML IJ SOLN
4.0000 mg | Freq: Once | INTRAMUSCULAR | Status: AC
  Administered 2015-03-21: 4 mg via INTRAVENOUS
  Filled 2015-03-21: qty 2

## 2015-03-21 MED ORDER — MORPHINE SULFATE (PF) 4 MG/ML IV SOLN
4.0000 mg | Freq: Once | INTRAVENOUS | Status: AC
  Administered 2015-03-21: 4 mg via INTRAVENOUS
  Filled 2015-03-21: qty 1

## 2015-03-21 MED ORDER — SODIUM CHLORIDE 0.9 % IV BOLUS (SEPSIS)
1000.0000 mL | Freq: Once | INTRAVENOUS | Status: AC
  Administered 2015-03-21: 1000 mL via INTRAVENOUS

## 2015-03-21 MED ORDER — IOHEXOL 300 MG/ML  SOLN
100.0000 mL | Freq: Once | INTRAMUSCULAR | Status: AC | PRN
  Administered 2015-03-21: 100 mL via INTRAVENOUS

## 2015-03-21 MED ORDER — OXYCODONE-ACETAMINOPHEN 7.5-325 MG PO TABS: 1.0000 | ORAL_TABLET | ORAL | Status: DC | PRN

## 2015-03-21 NOTE — Progress Notes (Signed)
Phone call to patient. He will use Tylenol Extra Strength 2 every 4-6 hours as needed over the counter until he can contact Dr. Lovell Sheehan office on Tuesday March 22, 2015.  Patient agrees.

## 2015-03-21 NOTE — ED Notes (Signed)
Pt drinking oral contrast at this time. 

## 2015-03-21 NOTE — Progress Notes (Signed)
Post op phone call to patient. Patient states "pain is about a 9", "taking 2 every 4 hours", "pain medication helps but I ran out".  Per patient "I called the office today and they told me that Dr. Lovell Sheehan was not available and would have to wait until tomorrow to get more medications".  Advised patient that I would let MD know he was out of medications.

## 2015-03-21 NOTE — Progress Notes (Signed)
Dr. Lovell Sheehan notified of patient complaint "out of pain medications".  Per Dr. Lovell Sheehan patient will be able to contact office on Tuesday 03/22/2015 to take care of this because pain medication cannot be called in.Marland Kitchen

## 2015-03-21 NOTE — Discharge Instructions (Signed)
Percocet as prescribed as needed for pain.  Follow-up with Dr. Lovell Sheehan Thursday morning at 9:30 AM.   Abdominal Pain, Adult Many things can cause abdominal pain. Usually, abdominal pain is not caused by a disease and will improve without treatment. It can often be observed and treated at home. Your health care provider will do a physical exam and possibly order blood tests and X-rays to help determine the seriousness of your pain. However, in many cases, more time must pass before a clear cause of the pain can be found. Before that point, your health care provider may not know if you need more testing or further treatment. HOME CARE INSTRUCTIONS Monitor your abdominal pain for any changes. The following actions may help to alleviate any discomfort you are experiencing:  Only take over-the-counter or prescription medicines as directed by your health care provider.  Do not take laxatives unless directed to do so by your health care provider.  Try a clear liquid diet (broth, tea, or water) as directed by your health care provider. Slowly move to a bland diet as tolerated. SEEK MEDICAL CARE IF:  You have unexplained abdominal pain.  You have abdominal pain associated with nausea or diarrhea.  You have pain when you urinate or have a bowel movement.  You experience abdominal pain that wakes you in the night.  You have abdominal pain that is worsened or improved by eating food.  You have abdominal pain that is worsened with eating fatty foods.  You have a fever. SEEK IMMEDIATE MEDICAL CARE IF:  Your pain does not go away within 2 hours.  You keep throwing up (vomiting).  Your pain is felt only in portions of the abdomen, such as the right side or the left lower portion of the abdomen.  You pass bloody or black tarry stools. MAKE SURE YOU:  Understand these instructions.  Will watch your condition.  Will get help right away if you are not doing well or get worse.   This  information is not intended to replace advice given to you by your health care provider. Make sure you discuss any questions you have with your health care provider.   Document Released: 03/07/2005 Document Revised: 02/16/2015 Document Reviewed: 02/04/2013 Elsevier Interactive Patient Education [employer]

## 2015-03-21 NOTE — ED Provider Notes (Signed)
CSN: [id number]     Arrival date & time 03/21/15  1556 History   First MD Initiated Contact with Patient 03/21/15 1639     Chief Complaint  Patient presents with  . Abdominal Pain     (Consider location/radiation/quality/duration/timing/severity/associated sxs/prior Treatment) HPI Comments: Patient is a 29 year old male with history of morbid obesity. He is status post laparoscopic cholecystectomy by Dr. Lovell Sheehan 3 days ago. He presents here today complaining of ongoing pain and stating that he has run out of his Percocet. He denies any fevers or chills. He denies any vomiting or diarrhea. He does report some bruising around the incisions but denies significant redness or drainage.  Patient is a 29 y.o. male presenting with abdominal pain. The history is provided by the patient.  Abdominal Pain Pain location:  RUQ Pain quality: cramping   Pain radiates to:  Does not radiate Pain severity:  Severe Onset quality:  Sudden Duration:  3 days Timing:  Constant Progression:  Worsening Chronicity:  New Relieved by:  Nothing Worsened by:  Movement and palpation   Past Medical History  Diagnosis Date  . Bipolar 1 disorder (HCC)   . Depression   . Sleep disorder    Past Surgical History  Procedure Laterality Date  . No past surgeries    . Cholecystectomy N/A 03/18/2015    Procedure: LAPAROSCOPIC CHOLECYSTECTOMY;  Surgeon: Franky Macho, MD;  Location: [facility];  Service: General;  Laterality: N/A;   History reviewed. No pertinent family history. Social History  Substance Use Topics  . Smoking status: Current Every Day Smoker -- 1.00 packs/day for 10 years    Types: Cigarettes  . Smokeless tobacco: None  . Alcohol Use: Yes     Comment: Occassionally     Review of Systems  Gastrointestinal: Positive for abdominal pain.  All other systems reviewed and are negative.     Allergies  Review of patient's allergies indicates no known allergies.  Home Medications   Prior to  Admission medications   Medication Sig Start Date End Date Taking? Authorizing Provider  oxyCODONE-acetaminophen (PERCOCET) 7.5-325 MG tablet Take 1-2 tablets by mouth every 4 (four) hours as needed. 03/18/15   Franky Macho, MD   BP 144/80 mmHg  Pulse 131  Temp(Src) 98.6 F (37 C) (Oral)  Resp 20  Ht  (1.905 m)  Wt 350 lb (158.759 kg)  BMI 43.75 kg/m2  SpO2 100% Physical Exam  Constitutional: He is oriented to person, place, and time. He appears well-developed and well-nourished. No distress.  HENT:  Head: Normocephalic and atraumatic.  Neck: Normal range of motion. Neck supple.  Cardiovascular: Normal rate, regular rhythm and normal heart sounds.   No murmur heard. Pulmonary/Chest: Effort normal and breath sounds normal. No respiratory distress. He has no wheezes.  Abdominal: Soft. Bowel sounds are normal. He exhibits no distension. There is tenderness. There is no rebound and no guarding.  There are 4 incision sites to the periumbilical region and right upper quadrant. These have staples in place and appear to be healing well. There is some bruising and ecchymosis around the sites, however no redness, erythema, or purulent drainage.  Musculoskeletal: Normal range of motion.  Neurological: He is alert and oriented to person, place, and time.  Skin: Skin is warm and dry. He is not diaphoretic.  Nursing note and vitals reviewed.   ED Course  Procedures (including critical care time) Labs Review Labs Reviewed  COMPREHENSIVE METABOLIC PANEL  LIPASE, BLOOD  CBC WITH DIFFERENTIAL/PLATELET  Imaging Review No results found. I have personally reviewed and evaluated these images and lab results as part of my medical decision-making.   EKG Interpretation None      MDM   Final diagnoses:  None    Workup reveals a white count of 12 which is consistent with his baseline. He is afebrile. CT scan shows slight intra-abdominal free fluid which is indeterminant cause. There  was a suggestion that this could possibly be hemorrhage. His hemoglobin has dropped to 10.1. I discussed this finding with Dr. Lovell Sheehan from general surgery who does not feel as though the patient is actively hemorrhaging. He is hemodynamically stable. Dr. Lovell Sheehan is recommending he follow up with him Thursday morning. He will be prescribed additional pain medication he can take as he is out of this. He is to call Dr. Lovell Sheehan sooner if he experiences further problems.    Geoffery Lyons, MD 03/21/15 2039

## 2015-03-21 NOTE — ED Notes (Signed)
PT states gallbladder surgery this past Friday and ran out of pain medication last night. PT states was told to come to ED for pain control d/t surgeon not in office today. PT states normal BM 03/20/15.

## 2017-05-06 ENCOUNTER — Encounter (HOSPITAL_COMMUNITY): Payer: Self-pay | Admitting: Emergency Medicine

## 2017-05-06 ENCOUNTER — Ambulatory Visit (HOSPITAL_COMMUNITY)
Admission: EM | Admit: 2017-05-06 | Discharge: 2017-05-06 | Disposition: A | Discharge status: Home / Self Care | Payer: Self-pay | Attending: Urgent Care | Admitting: Urgent Care

## 2017-05-06 DIAGNOSIS — K0889 Other specified disorders of teeth and supporting structures: Secondary | ICD-10-CM

## 2017-05-06 DIAGNOSIS — K047 Periapical abscess without sinus: Secondary | ICD-10-CM

## 2017-05-06 DIAGNOSIS — R030 Elevated blood-pressure reading, without diagnosis of hypertension: Secondary | ICD-10-CM

## 2017-05-06 DIAGNOSIS — K029 Dental caries, unspecified: Secondary | ICD-10-CM

## 2017-05-06 MED ORDER — AMOXICILLIN 500 MG PO CAPS: 500.0000 mg | ORAL_CAPSULE | Freq: Three times a day (TID) | ORAL | 0 refills | Status: DC

## 2017-05-06 NOTE — ED Triage Notes (Signed)
Pt here for dental pain starting this am

## 2017-05-06 NOTE — Discharge Instructions (Signed)
You may take 500mg  Tylenol with ibuprofen 600mg  with food every 6 hours for pain and inflammation associated with your tooth infection.

## 2017-05-06 NOTE — ED Provider Notes (Signed)
    MRN: [id number] DOB: 12-28-1985  Subjective:   Ian Gonzalez is a 31 y.o. male presenting for 1 day history of severe dental pail. He woke up from his sleep and has had progressively worsening right upper tooth pain, difficulty swallowing and opening his mouth. Denies fever, drainage, bleeding, facial swelling but does admit tightness of right side of his face. Has tried APAP and ibuprofen without relief. Regarding BP, patient has been told before he may need BP medications. Today, denies dizziness, chest pain, shortness of breath, heart racing, palpitations, nausea, vomiting, abdominal pain, hematuria, lower leg swelling.   Ian Gonzalez is not currently taking any medications and has No Known Allergies.  Ian Gonzalez  has a past medical history of Bipolar 1 disorder (HCC), Depression, and Sleep disorder. Also  has a past surgical history that includes No past surgeries and Cholecystectomy (N/A, 03/18/2015).  Objective:   Vitals: BP (!) 167/100 (BP Location: Left Arm)   Pulse 65   Temp 98.2 F (36.8 C) (Oral)   Resp 18   SpO2 96%   Physical Exam  Constitutional: He is oriented to person, place, and time. He appears well-developed and well-nourished.  HENT:  Mouth/Throat: Dental caries (with signs of tooth decay noted over area outlined) present. No dental abscesses.    Neck: Normal range of motion. Neck supple.  Cardiovascular: Normal rate, regular rhythm and intact distal pulses. Exam reveals no gallop and no friction rub.  No murmur heard. Pulmonary/Chest: No respiratory distress. He has no wheezes. He has no rales.  Lymphadenopathy:    He has no cervical adenopathy.  Neurological: He is alert and oriented to person, place, and time.  Skin: Skin is warm and dry.   Assessment and Plan :   Pain, dental  Tooth decay  Dental infection  Elevated blood pressure reading  Start amoxicillin TID, seek dental consult immediately, schedule APAP with ibuprofen. Return-to-clinic precautions  discussed, patient verbalized understanding. Regarding, BP, monitoring parameters discussed. Patient will f/u with primary care provider for BP recheck and management.   Wallis BambergMario Kasia Trego, PA-C [facility]  05/06/2017  11:07 AM    Wallis BambergMani, Natthew Marlatt, PA-C 05/06/17 1142

## 2017-05-17 ENCOUNTER — Encounter (HOSPITAL_COMMUNITY): Payer: Self-pay | Admitting: Emergency Medicine

## 2017-05-17 ENCOUNTER — Emergency Department (HOSPITAL_COMMUNITY)
Admission: EM | Admit: 2017-05-17 | Discharge: 2017-05-17 | Disposition: A | Discharge status: Home / Self Care | Payer: No Typology Code available for payment source | Attending: Emergency Medicine | Admitting: Emergency Medicine

## 2017-05-17 ENCOUNTER — Emergency Department (HOSPITAL_COMMUNITY): Payer: No Typology Code available for payment source

## 2017-05-17 ENCOUNTER — Emergency Department (HOSPITAL_COMMUNITY)
Admission: EM | Admit: 2017-05-17 | Discharge: 2017-05-17 | Disposition: A | Discharge status: Home / Self Care | Payer: No Typology Code available for payment source | Source: Home / Self Care

## 2017-05-17 DIAGNOSIS — T148XXA Other injury of unspecified body region, initial encounter: Secondary | ICD-10-CM | POA: Insufficient documentation

## 2017-05-17 DIAGNOSIS — Y999 Unspecified external cause status: Secondary | ICD-10-CM | POA: Diagnosis not present

## 2017-05-17 DIAGNOSIS — Y92410 Unspecified street and highway as the place of occurrence of the external cause: Secondary | ICD-10-CM | POA: Diagnosis not present

## 2017-05-17 DIAGNOSIS — F17210 Nicotine dependence, cigarettes, uncomplicated: Secondary | ICD-10-CM | POA: Insufficient documentation

## 2017-05-17 DIAGNOSIS — Z5321 Procedure and treatment not carried out due to patient leaving prior to being seen by health care provider: Secondary | ICD-10-CM | POA: Insufficient documentation

## 2017-05-17 DIAGNOSIS — M542 Cervicalgia: Secondary | ICD-10-CM

## 2017-05-17 DIAGNOSIS — S299XXA Unspecified injury of thorax, initial encounter: Secondary | ICD-10-CM | POA: Diagnosis present

## 2017-05-17 DIAGNOSIS — Y9389 Activity, other specified: Secondary | ICD-10-CM | POA: Insufficient documentation

## 2017-05-17 DIAGNOSIS — T07XXXA Unspecified multiple injuries, initial encounter: Secondary | ICD-10-CM

## 2017-05-17 MED ORDER — KETOROLAC TROMETHAMINE 30 MG/ML IJ SOLN
30.0000 mg | Freq: Once | INTRAMUSCULAR | Status: AC
  Administered 2017-05-17: 30 mg via INTRAMUSCULAR
  Filled 2017-05-17: qty 1

## 2017-05-17 MED ORDER — HYDROMORPHONE HCL 1 MG/ML IJ SOLN
1.0000 mg | Freq: Once | INTRAMUSCULAR | Status: AC
  Administered 2017-05-17: 1 mg via INTRAMUSCULAR
  Filled 2017-05-17: qty 1

## 2017-05-17 NOTE — Discharge Instructions (Signed)
Your x-rays and CT did not show any significant traumatic injuries. Your pain is likely from soft
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
## 2017-05-17 NOTE — ED Notes (Signed)
Pt alert & oriented x4, stable gait. Patient given discharge instructions, paperwork & prescription(s). Patient  instructed to stop at the registration desk to finish any additional paperwork. Patient verbalized understanding. Pt left department w/ no further questions. 
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
## 2017-09-21 ENCOUNTER — Emergency Department (HOSPITAL_COMMUNITY)
Admission: EM | Admit: 2017-09-21 | Discharge: 2017-09-21 | Disposition: A | Discharge status: Home / Self Care | Payer: Self-pay | Attending: Emergency Medicine | Admitting: Emergency Medicine

## 2017-09-21 ENCOUNTER — Other Ambulatory Visit: Payer: Self-pay

## 2017-09-21 ENCOUNTER — Encounter (HOSPITAL_COMMUNITY): Payer: Self-pay | Admitting: Emergency Medicine

## 2017-09-21 DIAGNOSIS — F319 Bipolar disorder, unspecified: Secondary | ICD-10-CM | POA: Insufficient documentation

## 2017-09-21 DIAGNOSIS — K047 Periapical abscess without sinus: Secondary | ICD-10-CM | POA: Insufficient documentation

## 2017-09-21 DIAGNOSIS — K029 Dental caries, unspecified: Secondary | ICD-10-CM | POA: Insufficient documentation

## 2017-09-21 DIAGNOSIS — F17210 Nicotine dependence, cigarettes, uncomplicated: Secondary | ICD-10-CM | POA: Insufficient documentation

## 2017-09-21 DIAGNOSIS — Z9049 Acquired absence of other specified parts of digestive tract: Secondary | ICD-10-CM | POA: Insufficient documentation

## 2017-09-21 DIAGNOSIS — Z79899 Other long term (current) drug therapy: Secondary | ICD-10-CM | POA: Insufficient documentation

## 2017-09-21 MED ORDER — OXYCODONE-ACETAMINOPHEN 5-325 MG PO TABS
1.0000 | ORAL_TABLET | Freq: Once | ORAL | Status: AC
  Administered 2017-09-21: 1 via ORAL
  Filled 2017-09-21: qty 1

## 2017-09-21 MED ORDER — AMOXICILLIN 500 MG PO CAPS: 500.0000 mg | ORAL_CAPSULE | Freq: Three times a day (TID) | ORAL | 0 refills | Status: DC

## 2017-09-21 MED ORDER — AMOXICILLIN 250 MG PO CAPS
1000.0000 mg | ORAL_CAPSULE | Freq: Once | ORAL | Status: AC
  Administered 2017-09-21: 1000 mg via ORAL
  Filled 2017-09-21: qty 4

## 2017-09-21 NOTE — ED Provider Notes (Signed)
[facility] EMERGENCY DEPARTMENT Provider Note   CSN: [id number] Arrival date & time: 09/21/17  1955     History   Chief Complaint Chief Complaint  Patient presents with  . Dental Pain    HPI Ian Gonzalez is a 32 y.o. male who presents to the ED with dental pain. Patient reports that the pain started last week but he took ibuprofen and it got better. The past 2 days the pain returned and has gotten worse with facial swelling. Patient reports that his right upper dental area has decayed teeth and some are broken. He has noted a drainage from his gum that taste bad. Patient denies fever.   HPI  Past Medical History:  Diagnosis Date  . Bipolar 1 disorder (HCC)   . Depression   . Sleep disorder     There are no active problems to display for this patient.   Past Surgical History:  Procedure Laterality Date  . CHOLECYSTECTOMY N/A 03/18/2015   Procedure: LAPAROSCOPIC CHOLECYSTECTOMY;  Surgeon: Franky Macho, MD;  Location: [facility];  Service: General;  Laterality: N/A;  . NO PAST SURGERIES          Home Medications    Prior to Admission medications   Medication Sig Start Date End Date Taking? Authorizing Provider  amoxicillin (AMOXIL) 500 MG capsule Take 1 capsule (500 mg total) by mouth 3 (three) times daily. 09/21/17   Janne Napoleon, NP  trazodone (DESYREL) 300 MG tablet Take 300 mg by mouth at bedtime.  10/25/14  [provider]    Family History No family history on file.  Social History Social History   Tobacco Use  . Smoking status: Current Every Day Smoker    Packs/day: 1.00    Years: 10.00    Pack years: 10.00    Types: Cigarettes  . Smokeless tobacco: Never Used  Substance Use Topics  . Alcohol use: Yes    Comment: Occassionally   . Drug use: No     Allergies   Patient has no known allergies.   Review of Systems Review of Systems  Constitutional: Negative for chills and fever.  HENT: Positive for dental problem. Negative for ear pain  and sore throat.   Respiratory: Negative for cough.   Cardiovascular: Negative for chest pain.  Gastrointestinal: Negative for abdominal pain and vomiting.  Musculoskeletal: Negative for neck stiffness.  Skin: Negative for rash.  Neurological: Positive for headaches.  Psychiatric/Behavioral: Negative for confusion.     Physical Exam Updated Vital Signs BP (!) 154/91 (BP Location: Right Arm)   Pulse 73   Temp 99 F (37.2 C) (Oral)   Resp 17   Ht 6\' 2"  (1.88 m)   Wt 129.3 kg (285 lb)   SpO2 96%   BMI 36.59 kg/m   Physical Exam  Constitutional: He appears well-developed and well-nourished. No distress.  HENT:  Right Ear: Tympanic membrane normal.  Left Ear: Tympanic membrane normal.  Nose: Nose normal.  Mouth/Throat: Uvula is midline and oropharynx is clear and moist. Dental abscesses present.    Multiple dental caries and broken teeth. Mild swelling to the right side of face.  Eyes: EOM are normal.  Neck: Neck supple.  Cardiovascular: Normal rate.  Pulmonary/Chest: Effort normal.  Musculoskeletal: Normal range of motion.  Neurological: He is alert.  Skin: Skin is warm and dry.  Psychiatric: He has a normal mood and affect.  Nursing note and vitals reviewed.    ED Treatments / Results  Labs (all  labs ordered are listed, but only abnormal results are displayed) Labs Reviewed - No data to display Radiology No results found.  Procedures Procedures (including critical care time)  Medications Ordered in ED Medications  amoxicillin (AMOXIL) capsule 1,000 mg (has no administration in time range)  oxyCODONE-acetaminophen (PERCOCET/ROXICET) 5-325 MG per tablet 1 tablet (has no administration in time range)     Initial Impression / Assessment and Plan / ED Course  I have reviewed the triage vital signs and the nursing notes. Patient with toothache. Abscess noted right upper dental area that is currently draining.  Exam unconcerning for Ludwig's angina or spread of  infection.  Will treat with amoxicillin and anti-inflammatories medicine.  Urged patient to follow-up with dentist. Discussed with the patient if symptoms worsen such as fever, increased swelling of face, severe head ache, neck pain, difficulty swallowing or other problems to return immediately.  Final Clinical Impressions(s) / ED Diagnoses   Final diagnoses:  Dental abscess  Pain due to dental caries    ED Discharge Orders        Ordered    amoxicillin (AMOXIL) 500 MG capsule  3 times daily     09/21/17 2041       Kerrie Buffaloeese, Hope [place], [place]NP 09/21/17 2048    Loren RacerYelverton, David, MD 09/21/17 [phone]

## 2017-09-21 NOTE — ED Triage Notes (Signed)
Pt c/o right upper jaw pain with swelling since this am.

## 2017-09-21 NOTE — Discharge Instructions (Signed)
Take tylenol and ibuprofen in addition to the antibiotic. Follow up with a dentist as soon as possible. Return here for worsening symptoms.

## 2018-07-12 ENCOUNTER — Encounter (HOSPITAL_COMMUNITY): Payer: Self-pay | Admitting: Emergency Medicine

## 2018-07-12 ENCOUNTER — Emergency Department (HOSPITAL_COMMUNITY)
Admission: EM | Admit: 2018-07-12 | Discharge: 2018-07-12 | Disposition: A | Discharge status: Home / Self Care | Payer: PRIVATE HEALTH INSURANCE | Attending: Emergency Medicine | Admitting: Emergency Medicine

## 2018-07-12 ENCOUNTER — Other Ambulatory Visit: Payer: Self-pay

## 2018-07-12 DIAGNOSIS — K0889 Other specified disorders of teeth and supporting structures: Secondary | ICD-10-CM | POA: Diagnosis not present

## 2018-07-12 DIAGNOSIS — F17210 Nicotine dependence, cigarettes, uncomplicated: Secondary | ICD-10-CM | POA: Diagnosis not present

## 2018-07-12 MED ORDER — IBUPROFEN 800 MG PO TABS: 800.0000 mg | ORAL_TABLET | Freq: Three times a day (TID) | ORAL | 0 refills | Status: DC

## 2018-07-12 MED ORDER — HYDROCODONE-ACETAMINOPHEN 5-325 MG PO TABS: 2.0000 | ORAL_TABLET | ORAL | 0 refills | Status: DC | PRN

## 2018-07-12 MED ORDER — PENICILLIN V POTASSIUM 500 MG PO TABS: 500.0000 mg | ORAL_TABLET | Freq: Three times a day (TID) | ORAL | 0 refills | Status: DC

## 2018-07-12 MED ORDER — PENICILLIN V POTASSIUM 250 MG PO TABS
500.0000 mg | ORAL_TABLET | Freq: Once | ORAL | Status: AC
  Administered 2018-07-12: 500 mg via ORAL
  Filled 2018-07-12: qty 2

## 2018-07-12 MED ORDER — HYDROCODONE-ACETAMINOPHEN 5-325 MG PO TABS
1.0000 | ORAL_TABLET | ORAL | 0 refills | Status: DC | PRN
Start: 2018-07-12 — End: 2021-02-08

## 2018-07-12 MED ORDER — HYDROCODONE-ACETAMINOPHEN 5-325 MG PO TABS: ORAL_TABLET | ORAL | 0 refills | Status: DC

## 2018-07-12 NOTE — ED Provider Notes (Addendum)
[facility] EMERGENCY DEPARTMENT Provider Note   CSN: [id number] Arrival date & time: 07/12/18  1834     History   Chief Complaint Chief Complaint  Patient presents with  . Dental Pain    HPI Ian Gonzalez is a 33 y.o. male.  HPI  Ian Gonzalez is a 33 y.o. male who presents to the Emergency Department complaining of right upper dental pain for 2-3 days.  Describes pain as gradually worsening and yesterday, developed swelling of the gums around the affected tooth.  He has tried tylenol and ibuprofen without relief.  He states that he has had pain in the same tooth previously, but was told by the dentist that the tooth would need to be extracted and he does not currently have the funds for this.  He denies facial swelling, fever, chills, nausea or vomiting, neck pain, difficulty swallowing or breathing.    Past Medical History:  Diagnosis Date  . Bipolar 1 disorder (HCC)   . Depression   . Sleep disorder     There are no active problems to display for this patient.   Past Surgical History:  Procedure Laterality Date  . CHOLECYSTECTOMY N/A 03/18/2015   Procedure: LAPAROSCOPIC CHOLECYSTECTOMY;  Surgeon: Franky Macho, MD;  Location: [facility];  Service: General;  Laterality: N/A;  . NO PAST SURGERIES          Home Medications    Prior to Admission medications   Medication Sig Start Date End Date Taking? Authorizing Provider  amoxicillin (AMOXIL) 500 MG capsule Take 1 capsule (500 mg total) by mouth 3 (three) times daily. 09/21/17   Janne Napoleon, NP    Family History No family history on file.  Social History Social History   Tobacco Use  . Smoking status: Current Every Day Smoker    Packs/day: 1.00    Years: 10.00    Pack years: 10.00    Types: Cigarettes  . Smokeless tobacco: Never Used  Substance Use Topics  . Alcohol use: Yes    Comment: Occassionally   . Drug use: No     Allergies   Patient has no known allergies.   Review of Systems Review of  Systems  Constitutional: Negative for appetite change and fever.  HENT: Positive for dental problem. Negative for congestion, facial swelling, sore throat and trouble swallowing.   Eyes: Negative for pain and visual disturbance.  Musculoskeletal: Negative for neck pain and neck stiffness.  Neurological: Negative for dizziness, facial asymmetry and headaches.  Hematological: Negative for adenopathy.     Physical Exam Updated Vital Signs BP 138/83 (BP Location: Right Arm)   Pulse 85   Temp 97.9 F (36.6 C) (Oral)   Resp 15   Ht 6\' 3"  (1.905 m)   Wt 129.3 kg   SpO2 97%   BMI 35.62 kg/m   Physical Exam Vitals signs and nursing note reviewed.  Constitutional:      General: He is not in acute distress.    Appearance: He is well-developed.  HENT:     Head: Normocephalic and atraumatic.     Jaw: No trismus.     Right Ear: Tympanic membrane and ear canal normal.     Left Ear: Tympanic membrane and ear canal normal.     Mouth/Throat:     Mouth: Mucous membranes are moist.     Dentition: Dental caries present. No dental abscesses.     Pharynx: Oropharynx is clear. Uvula midline. No oropharyngeal exudate, posterior oropharyngeal erythema  or uvula swelling.     Comments: Tenderness to palpation of the right upper first molar.  There is a significant amount of dental caries to the tooth with mild erythema and edema of the surrounding gums.  No obvious abscess noted. Neck:     Musculoskeletal: Normal range of motion and neck supple.  Cardiovascular:     Rate and Rhythm: Normal rate and regular rhythm.     Heart sounds: Normal heart sounds. No murmur.  Pulmonary:     Effort: Pulmonary effort is normal.     Breath sounds: Normal breath sounds.  Musculoskeletal: Normal range of motion.  Lymphadenopathy:     Cervical: No cervical adenopathy.  Skin:    General: Skin is warm and dry.  Neurological:     Mental Status: He is alert and oriented to person, place, and time.     Motor: No  abnormal muscle tone.     Coordination: Coordination normal.      ED Treatments / Results  Labs (all labs ordered are listed, but only abnormal results are displayed) Labs Reviewed - No data to display  EKG None  Radiology No results found.  Procedures Procedures (including critical care time)  Medications Ordered in ED Medications - No data to display   Initial Impression / Assessment and Plan / ED Course  I have reviewed the triage vital signs and the nursing notes.  Pertinent labs & imaging results that were available during my care of the patient were reviewed by me and considered in my medical decision making (see chart for details).     Patient is well-appearing and nontoxic.  Dental caries noted with possible early developing dental abscess.  There is no indication for I&D at this time.  Airway is patent, no concerning symptoms for Ludewig's angina.  He does have a dentist to follow-up with.  I feel he is appropriate for discharge home, return precautions discussed.  2023  Pt returned to the dept after d/c [facility] and he lives out of town.  He is requesting medication here for tonight.  Vicodin prepack was E [facility] in [facility], I have called [facility] pharmacy [place] and left a message to avoid that prescription.  He was dispensed a prepack here for use tonight.   Final Clinical Impressions(s) / ED Diagnoses   Final diagnoses:  Pain, dental    ED Discharge Orders    None       Pauline Ausriplett, Cornie Mccomber, PA-C 07/12/18 1948    Pauline Ausriplett, Janisse Ghan, PA-C 07/12/18 2025    Long, Arlyss RepressJoshua G, MD 07/13/18 [phone]

## 2018-07-12 NOTE — ED Triage Notes (Signed)
Pt c/o RT upper dental pain x 3 days.

## 2018-07-12 NOTE — Discharge Instructions (Addendum)
Take the antibiotic as directed until its finished.  You may use warm salt water rinses and over-the-counter Orajel if needed.  Follow-up with your primary doctor for recheck.

## 2018-07-12 NOTE — ED Notes (Signed)
Pharmacies closed Pt in to request prepack Script for Norco prepack ordered as well as pen po  Pt given pcn as well as prepack with script in shredder

## 2018-07-14 MED FILL — Hydrocodone-Acetaminophen Tab 5-325 MG: ORAL | Qty: 6 | Status: AC

## 2018-08-21 ENCOUNTER — Other Ambulatory Visit: Payer: Self-pay

## 2018-08-21 ENCOUNTER — Emergency Department (HOSPITAL_COMMUNITY)
Admission: EM | Admit: 2018-08-21 | Discharge: 2018-08-21 | Disposition: A | Discharge status: Home / Self Care | Payer: PRIVATE HEALTH INSURANCE | Attending: Emergency Medicine | Admitting: Emergency Medicine

## 2018-08-21 ENCOUNTER — Encounter (HOSPITAL_COMMUNITY): Payer: Self-pay

## 2018-08-21 DIAGNOSIS — F17210 Nicotine dependence, cigarettes, uncomplicated: Secondary | ICD-10-CM | POA: Diagnosis not present

## 2018-08-21 DIAGNOSIS — K029 Dental caries, unspecified: Secondary | ICD-10-CM | POA: Insufficient documentation

## 2018-08-21 DIAGNOSIS — K0889 Other specified disorders of teeth and supporting structures: Secondary | ICD-10-CM

## 2018-08-21 MED ORDER — NAPROXEN 375 MG PO TABS: 375.0000 mg | ORAL_TABLET | Freq: Two times a day (BID) | ORAL | 0 refills | Status: DC

## 2018-08-21 MED ORDER — OXYCODONE-ACETAMINOPHEN 5-325 MG PO TABS
1.0000 | ORAL_TABLET | Freq: Once | ORAL | Status: AC
Start: 2018-08-21 — End: 2018-08-21
  Administered 2018-08-21: 1 via ORAL
  Filled 2018-08-21: qty 1

## 2018-08-21 MED ORDER — AMOXICILLIN 500 MG PO CAPS: 500.0000 mg | ORAL_CAPSULE | Freq: Three times a day (TID) | ORAL | 0 refills | Status: DC

## 2018-08-21 NOTE — ED Provider Notes (Signed)
[facility] EMERGENCY DEPARTMENT Provider Note   CSN: [id number] Arrival date & time: 08/21/18  1742    History   Chief Complaint Chief Complaint  Patient presents with  . Dental Pain    HPI Ian Gonzalez is a 33 y.o. male.     Patient presents with dental pain. Pain started about 3 days ago. The tooth involved in broken at the gum line. Patient attempted to get in with his dentist, but they are unable to see him for several weeks.  The history is provided by the patient. No language interpreter was used.  Dental Pain  Location:  Upper Upper teeth location:  3/RU 1st molar Quality:  Aching Severity:  Moderate Onset quality:  Gradual Duration:  3 days Timing:  Constant Progression:  Worsening Context: dental fracture and poor dentition   Ineffective treatments:  None tried Associated symptoms: no difficulty swallowing, no facial swelling and no trismus   Risk factors: lack of dental care   Risk factors: no diabetes     Past Medical History:  Diagnosis Date  . Bipolar 1 disorder (HCC)   . Depression   . Sleep disorder     There are no active problems to display for this patient.   Past Surgical History:  Procedure Laterality Date  . CHOLECYSTECTOMY N/A 03/18/2015   Procedure: LAPAROSCOPIC CHOLECYSTECTOMY;  Surgeon: Franky Macho, MD;  Location: [facility];  Service: General;  Laterality: N/A;  . NO PAST SURGERIES          Home Medications    Prior to Admission medications   Medication Sig Start Date End Date Taking? Authorizing Provider  HYDROcodone-acetaminophen (NORCO/VICODIN) 5-325 MG tablet Take 2 tablets by mouth every 4 (four) hours as needed. 07/12/18   Triplett, Tammy, PA-C  HYDROcodone-acetaminophen (NORCO/VICODIN) 5-325 MG tablet Take 1 tablet by mouth every 4 (four) hours as needed. 07/12/18   Triplett, Tammy, PA-C  ibuprofen (ADVIL,MOTRIN) 800 MG tablet Take 1 tablet (800 mg total) by mouth 3 (three) times daily. 07/12/18   Triplett, Tammy, PA-C   penicillin v potassium (VEETID) 500 MG tablet Take 1 tablet (500 mg total) by mouth 3 (three) times daily. 07/12/18   Pauline Aus, PA-C    Family History No family history on file.  Social History Social History   Tobacco Use  . Smoking status: Current Every Day Smoker    Packs/day: 1.00    Years: 10.00    Pack years: 10.00    Types: Cigarettes  . Smokeless tobacco: Never Used  Substance Use Topics  . Alcohol use: Yes    Comment: Occassionally   . Drug use: No     Allergies   Patient has no known allergies.   Review of Systems Review of Systems  HENT: Positive for dental problem. Negative for facial swelling.   All other systems reviewed and are negative.    Physical Exam Updated Vital Signs BP (!) 143/95 (BP Location: Right Arm)   Pulse 87   Temp 98.2 F (36.8 C) (Oral)   Resp 16   Ht 6\' 2"  (1.88 m)   Wt 129.3 kg   SpO2 98%   BMI 36.59 kg/m   Physical Exam Constitutional:      Appearance: Normal appearance. He is obese.  HENT:     Mouth/Throat:     Mouth: Mucous membranes are moist.     Dentition: Dental caries present.   Eyes:     Conjunctiva/sclera: Conjunctivae normal.  Cardiovascular:     Rate  and Rhythm: Normal rate and regular rhythm.  Pulmonary:     Effort: Pulmonary effort is normal.     Breath sounds: Normal breath sounds.  Skin:    General: Skin is warm and dry.  Neurological:     Mental Status: He is alert and oriented to person, place, and time.  Psychiatric:        Mood and Affect: Mood normal.      ED Treatments / Results  Labs (all labs ordered are listed, but only abnormal results are displayed) Labs Reviewed - No data to display  EKG None  Radiology No results found.  Procedures Procedures (including critical care time)  Medications Ordered in ED Medications  oxyCODONE-acetaminophen (PERCOCET/ROXICET) 5-325 MG per tablet 1 tablet (1 tablet Oral Given 08/21/18 2012)     Initial Impression / Assessment and  Plan / ED Course  I have reviewed the triage vital signs and the nursing notes.  Pertinent labs & imaging results that were available during my care of the patient were reviewed by me and considered in my medical decision making (see chart for details).        Patient with dentalgia.  No abscess requiring immediate incision and drainage.  Exam not concerning for Ludwig's angina or pharyngeal abscess.  Will treat with naproxen and amoxicillin. Pt instructed to follow-up with dentist.  Discussed return precautions. Pt safe for discharge.  Final Clinical Impressions(s) / ED Diagnoses   Final diagnoses:  Dental caries  Tooth pain    ED Discharge Orders         Ordered    naproxen (NAPROSYN) 375 MG tablet  2 times daily     08/21/18 1953    amoxicillin (AMOXIL) 500 MG capsule  3 times daily     08/21/18 1953           Felicie Morn, NP 08/21/18 2023    Samuel Jester, DO 08/22/18 2315

## 2018-08-21 NOTE — ED Triage Notes (Signed)
Pt presents to ED with dental pain. Pt c/o pain right upper tooth. Pt states will be 6 weeks before he can see his dentist.

## 2018-08-21 NOTE — ED Notes (Signed)
Patient verbalizes understanding of discharge instructions, prescriptions, and follow up care. Patient ambulatory out of department at this time. 

## 2020-12-20 ENCOUNTER — Ambulatory Visit
Admission: EM | Admit: 2020-12-20 | Discharge: 2020-12-20 | Disposition: A | Discharge status: Home / Self Care | Payer: BC Managed Care – PPO | Attending: Family Medicine | Admitting: Family Medicine

## 2020-12-20 ENCOUNTER — Encounter: Payer: Self-pay | Admitting: Emergency Medicine

## 2020-12-20 DIAGNOSIS — K047 Periapical abscess without sinus: Secondary | ICD-10-CM

## 2020-12-20 MED ORDER — CHLORHEXIDINE GLUCONATE 0.12 % MT SOLN: 15.0000 mL | Freq: Two times a day (BID) | OROMUCOSAL | 0 refills | Status: AC

## 2020-12-20 MED ORDER — AMOXICILLIN 500 MG PO TABS: 1000.0000 mg | ORAL_TABLET | Freq: Two times a day (BID) | ORAL | 0 refills | Status: AC

## 2020-12-20 NOTE — ED Triage Notes (Signed)
Dental pain to top upper LT side x 2-3 days has appt at dentist but they are on vacation until 07/18

## 2020-12-20 NOTE — ED Provider Notes (Signed)
[facility]    CSN: [id number] Arrival date & time: 12/20/20  1625      History   Chief Complaint Chief Complaint  Patient presents with   Dental Pain    HPI Ian Gonzalez is a 35 y.o. male.   HPI Patient presents today with a dental problem.  He reports pain at the left upper gumline for the last 2 to 3 days.  He endorses some mild swelling.  He has reached out to his dental provider and they are on vacation for 1 week. He has history of dental caries. No fever or facial swelling. Past Medical History:  Diagnosis Date   Bipolar 1 disorder (HCC)    Depression    Sleep disorder     There are no problems to display for this patient.   Past Surgical History:  Procedure Laterality Date   CHOLECYSTECTOMY N/A 03/18/2015   Procedure: LAPAROSCOPIC CHOLECYSTECTOMY;  Surgeon: Franky Macho, MD;  Location: [facility];  Service: General;  Laterality: N/A;   NO PAST SURGERIES         Home Medications    Prior to Admission medications   Medication Sig Start Date End Date Taking? Authorizing Provider  amoxicillin (AMOXIL) 500 MG tablet Take 2 tablets (1,000 mg total) by mouth 2 (two) times daily for 10 days. 12/20/20 12/30/20 Yes Bing Neighbors, FNP  chlorhexidine (PERIDEX) 0.12 % solution Use as directed 15 mLs in the mouth or throat 2 (two) times daily for 5 days. 12/20/20 12/25/20 Yes Bing Neighbors, FNP  HYDROcodone-acetaminophen (NORCO/VICODIN) 5-325 MG tablet Take 2 tablets by mouth every 4 (four) hours as needed. 07/12/18   Triplett, Tammy, PA-C  HYDROcodone-acetaminophen (NORCO/VICODIN) 5-325 MG tablet Take 1 tablet by mouth every 4 (four) hours as needed. 07/12/18   Triplett, Tammy, PA-C  ibuprofen (ADVIL,MOTRIN) 800 MG tablet Take 1 tablet (800 mg total) by mouth 3 (three) times daily. 07/12/18   Triplett, Tammy, PA-C  naproxen (NAPROSYN) 375 MG tablet Take 1 tablet (375 mg total) by mouth 2 (two) times daily. 08/21/18   Felicie Morn, NP  penicillin v potassium  (VEETID) 500 MG tablet Take 1 tablet (500 mg total) by mouth 3 (three) times daily. 07/12/18   Pauline Aus, PA-C    Family History No family history on file.  Social History Social History   Tobacco Use   Smoking status: Every Day    Packs/day: 1.00    Years: 10.00    Pack years: 10.00    Types: Cigarettes   Smokeless tobacco: Never  Vaping Use   Vaping Use: Some days  Substance Use Topics   Alcohol use: Yes    Comment: Occassionally    Drug use: No     Allergies   Patient has no known allergies.   Review of Systems Review of Systems Pertinent negatives listed in HPI  Physical Exam Triage Vital Signs ED Triage Vitals  Enc Vitals Group     BP 12/20/20 1742 140/76     Pulse Rate 12/20/20 1742 80     Resp 12/20/20 1742 19     Temp 12/20/20 1742 98.5 F (36.9 C)     Temp Source 12/20/20 1742 Oral     SpO2 12/20/20 1742 95 %     Weight --      Height --      Head Circumference --      Peak Flow --      Pain Score 12/20/20 1743 8  Pain Loc --      Pain Edu? --      Excl. in GC? --    No data found.  Updated Vital Signs BP 140/76 (BP Location: Right Arm)   Pulse 80   Temp 98.5 F (36.9 C) (Oral)   Resp 19   SpO2 95%   Visual Acuity Right Eye Distance:   Left Eye Distance:   Bilateral Distance:    Right Eye Near:   Left Eye Near:    Bilateral Near:     Physical Exam Constitutional:      Appearance: Normal appearance.  HENT:     Head:     Comments: Broken molar tooth. Dental caries. Cardiovascular:     Rate and Rhythm: Normal rate and regular rhythm.  Pulmonary:     Effort: Pulmonary effort is normal.     Breath sounds: Normal breath sounds.  Skin:    Capillary Refill: Capillary refill takes less than 2 seconds.  Neurological:     General: No focal deficit present.     Mental Status: He is alert.  Psychiatric:        Mood and Affect: Mood normal.     UC Treatments / Results  Labs (all labs ordered are listed, but only abnormal  results are displayed) Labs Reviewed - No data to display  EKG   Radiology No results found.  Procedures Procedures (including critical care time)  Medications Ordered in UC Medications - No data to display  Initial Impression / Assessment and Plan / UC Course  I have reviewed the triage vital signs and the nursing notes.  Pertinent labs & imaging results that were available during my care of the patient were reviewed by me and considered in my medical decision making (see chart for details).    Dental infection. Treatment per discharge medication orders. Follow-up with dental provider for possible extraction. Final Clinical Impressions(s) / UC Diagnoses   Final diagnoses:  Dental infection   Discharge Instructions   None    ED Prescriptions     Medication Sig Dispense Auth. Provider   amoxicillin (AMOXIL) 500 MG tablet Take 2 tablets (1,000 mg total) by mouth 2 (two) times daily for 10 days. 40 tablet Bing Neighbors, FNP   chlorhexidine (PERIDEX) 0.12 % solution Use as directed 15 mLs in the mouth or throat 2 (two) times daily for 5 days. 150 mL Bing Neighbors, FNP      PDMP not reviewed this encounter.   Bing Neighbors, FNP 12/20/20 1818

## 2021-02-05 ENCOUNTER — Other Ambulatory Visit: Payer: Self-pay

## 2021-02-05 ENCOUNTER — Encounter (HOSPITAL_COMMUNITY): Payer: Self-pay

## 2021-02-05 ENCOUNTER — Emergency Department (HOSPITAL_COMMUNITY)
Admission: EM | Admit: 2021-02-05 | Discharge: 2021-02-06 | Discharge status: Against Medical Advice | Payer: BC Managed Care – PPO | Source: Home / Self Care | Attending: Emergency Medicine | Admitting: Emergency Medicine

## 2021-02-05 ENCOUNTER — Emergency Department (HOSPITAL_COMMUNITY): Payer: BC Managed Care – PPO

## 2021-02-05 DIAGNOSIS — R519 Headache, unspecified: Secondary | ICD-10-CM

## 2021-02-05 DIAGNOSIS — Z9049 Acquired absence of other specified parts of digestive tract: Secondary | ICD-10-CM | POA: Diagnosis not present

## 2021-02-05 DIAGNOSIS — Z791 Long term (current) use of non-steroidal anti-inflammatories (NSAID): Secondary | ICD-10-CM | POA: Diagnosis not present

## 2021-02-05 DIAGNOSIS — Z20822 Contact with and (suspected) exposure to covid-19: Secondary | ICD-10-CM | POA: Diagnosis not present

## 2021-02-05 DIAGNOSIS — R51 Headache with orthostatic component, not elsewhere classified: Secondary | ICD-10-CM | POA: Diagnosis not present

## 2021-02-05 DIAGNOSIS — H5462 Unqualified visual loss, left eye, normal vision right eye: Secondary | ICD-10-CM | POA: Diagnosis not present

## 2021-02-05 DIAGNOSIS — M47812 Spondylosis without myelopathy or radiculopathy, cervical region: Secondary | ICD-10-CM | POA: Diagnosis not present

## 2021-02-05 DIAGNOSIS — R9431 Abnormal electrocardiogram [ECG] [EKG]: Secondary | ICD-10-CM | POA: Diagnosis not present

## 2021-02-05 DIAGNOSIS — T380X5A Adverse effect of glucocorticoids and synthetic analogues, initial encounter: Secondary | ICD-10-CM | POA: Diagnosis not present

## 2021-02-05 DIAGNOSIS — E669 Obesity, unspecified: Secondary | ICD-10-CM | POA: Diagnosis not present

## 2021-02-05 DIAGNOSIS — H47092 Other disorders of optic nerve, not elsewhere classified, left eye: Secondary | ICD-10-CM | POA: Diagnosis not present

## 2021-02-05 DIAGNOSIS — D72829 Elevated white blood cell count, unspecified: Secondary | ICD-10-CM | POA: Diagnosis not present

## 2021-02-05 DIAGNOSIS — R030 Elevated blood-pressure reading, without diagnosis of hypertension: Secondary | ICD-10-CM | POA: Diagnosis not present

## 2021-02-05 DIAGNOSIS — H469 Unspecified optic neuritis: Secondary | ICD-10-CM | POA: Diagnosis not present

## 2021-02-05 DIAGNOSIS — D72828 Other elevated white blood cell count: Secondary | ICD-10-CM | POA: Diagnosis not present

## 2021-02-05 DIAGNOSIS — Z8249 Family history of ischemic heart disease and other diseases of the circulatory system: Secondary | ICD-10-CM | POA: Diagnosis not present

## 2021-02-05 DIAGNOSIS — H539 Unspecified visual disturbance: Secondary | ICD-10-CM | POA: Diagnosis not present

## 2021-02-05 DIAGNOSIS — I471 Supraventricular tachycardia: Secondary | ICD-10-CM | POA: Diagnosis not present

## 2021-02-05 DIAGNOSIS — M79652 Pain in left thigh: Secondary | ICD-10-CM | POA: Diagnosis not present

## 2021-02-05 DIAGNOSIS — E612 Magnesium deficiency: Secondary | ICD-10-CM | POA: Diagnosis not present

## 2021-02-05 DIAGNOSIS — H547 Unspecified visual loss: Secondary | ICD-10-CM | POA: Diagnosis not present

## 2021-02-05 DIAGNOSIS — M47814 Spondylosis without myelopathy or radiculopathy, thoracic region: Secondary | ICD-10-CM | POA: Diagnosis not present

## 2021-02-05 DIAGNOSIS — G43909 Migraine, unspecified, not intractable, without status migrainosus: Secondary | ICD-10-CM | POA: Diagnosis not present

## 2021-02-05 DIAGNOSIS — Z6836 Body mass index (BMI) 36.0-36.9, adult: Secondary | ICD-10-CM | POA: Diagnosis not present

## 2021-02-05 DIAGNOSIS — M5124 Other intervertebral disc displacement, thoracic region: Secondary | ICD-10-CM | POA: Diagnosis not present

## 2021-02-05 DIAGNOSIS — H53139 Sudden visual loss, unspecified eye: Secondary | ICD-10-CM | POA: Diagnosis not present

## 2021-02-05 DIAGNOSIS — F17210 Nicotine dependence, cigarettes, uncomplicated: Secondary | ICD-10-CM | POA: Insufficient documentation

## 2021-02-05 DIAGNOSIS — Z720 Tobacco use: Secondary | ICD-10-CM | POA: Diagnosis not present

## 2021-02-05 DIAGNOSIS — G379 Demyelinating disease of central nervous system, unspecified: Secondary | ICD-10-CM | POA: Diagnosis not present

## 2021-02-05 LAB — COMPREHENSIVE METABOLIC PANEL
ALT: 17 U/L (ref 0–44)
AST: 17 U/L (ref 15–41)
Albumin: 3.8 g/dL (ref 3.5–5.0)
Alkaline Phosphatase: 70 U/L (ref 38–126)
Anion gap: 6 (ref 5–15)
BUN: 9 mg/dL (ref 6–20)
CO2: 26 mmol/L (ref 22–32)
Calcium: 8.8 mg/dL — ABNORMAL LOW (ref 8.9–10.3)
Chloride: 103 mmol/L (ref 98–111)
Creatinine, Ser: 0.62 mg/dL (ref 0.61–1.24)
GFR, Estimated: >60 mL/min (ref >60)
Glucose, Bld: 105 mg/dL — ABNORMAL HIGH (ref 70–99)
Potassium: 3.8 mmol/L (ref 3.5–5.1)
Sodium: 135 mmol/L (ref 135–145)
Total Bilirubin: 0.4 mg/dL (ref 0.3–1.2)
Total Protein: 7.2 g/dL (ref 6.5–8.1)

## 2021-02-05 LAB — CBC WITH DIFFERENTIAL/PLATELET
Abs Immature Granulocytes: 0.04 10*3/uL (ref 0.00–0.07)
Basophils Absolute: 0.1 10*3/uL (ref 0.0–0.1)
Basophils Relative: 1 %
Eosinophils Absolute: 0.3 10*3/uL (ref 0.0–0.5)
Eosinophils Relative: 2 %
HCT: 43 % (ref 39.0–52.0)
Hemoglobin: 13.8 g/dL (ref 13.0–17.0)
Immature Granulocytes: 0 %
Lymphocytes Relative: 22 %
Lymphs Abs: 2.8 10*3/uL (ref 0.7–4.0)
MCH: 27.2 pg (ref 26.0–34.0)
MCHC: 32.1 g/dL (ref 30.0–36.0)
MCV: 84.8 fL (ref 80.0–100.0)
Monocytes Absolute: 0.5 10*3/uL (ref 0.1–1.0)
Monocytes Relative: 4 %
Neutro Abs: 8.9 10*3/uL — ABNORMAL HIGH (ref 1.7–7.7)
Neutrophils Relative %: 71 %
Platelets: 261 10*3/uL (ref 150–400)
RBC: 5.07 MIL/uL (ref 4.22–5.81)
RDW: 13.4 % (ref 11.5–15.5)
WBC: 12.5 10*3/uL — ABNORMAL HIGH (ref 4.0–10.5)
nRBC: 0 % (ref 0.0–0.2)

## 2021-02-05 LAB — C-REACTIVE PROTEIN: CRP: 0.7 mg/dL (ref <1.0)

## 2021-02-05 MED ORDER — METOCLOPRAMIDE HCL 5 MG/ML IJ SOLN
10.0000 mg | Freq: Once | INTRAMUSCULAR | Status: AC
  Administered 2021-02-05: 10 mg via INTRAVENOUS
  Filled 2021-02-05: qty 2

## 2021-02-05 MED ORDER — DIPHENHYDRAMINE HCL 50 MG/ML IJ SOLN
12.5000 mg | Freq: Once | INTRAMUSCULAR | Status: AC
Start: 2021-02-05 — End: 2021-02-05
  Administered 2021-02-05: 12.5 mg via INTRAVENOUS
  Filled 2021-02-05: qty 1

## 2021-02-05 MED ORDER — KETOROLAC TROMETHAMINE 15 MG/ML IJ SOLN
15.0000 mg | Freq: Once | INTRAMUSCULAR | Status: AC
  Administered 2021-02-05: 15 mg via INTRAVENOUS
  Filled 2021-02-05: qty 1

## 2021-02-05 MED ORDER — SODIUM CHLORIDE 0.9 % IV BOLUS
1000.0000 mL | Freq: Once | INTRAVENOUS | Status: AC
  Administered 2021-02-05: 1000 mL via INTRAVENOUS

## 2021-02-05 NOTE — ED Provider Notes (Signed)
[facility] EMERGENCY DEPARTMENT Provider Note   CSN: [id number] Arrival date & time: 02/05/21  1845     History Chief Complaint  Patient presents with   Migraine    Ian Gonzalez is a 35 y.o. male.  HPI   35 y/o male with a h/o bipolar disorder, depression, sleep disorder who presents to the ED today for eval of headache. States that he started having a headache 3 days ago. He is c/o a left sided headache. Pain is constant and he rates pain 9-10/10.   He is c/o associated left eye blurred vision that started yesterday. Denies numbness/weakness, speech difficulty, dizziness, lightheadedness. Denies NV.   He has had migraines in the past that are usually located on the left side of his head.   Denies drug use or recent head trauma  Past Medical History:  Diagnosis Date   Bipolar 1 disorder (HCC)    Depression    Sleep disorder     There are no problems to display for this patient.   Past Surgical History:  Procedure Laterality Date   CHOLECYSTECTOMY N/A 03/18/2015   Procedure: LAPAROSCOPIC CHOLECYSTECTOMY;  Surgeon: Franky Macho, MD;  Location: [facility];  Service: General;  Laterality: N/A;   NO PAST SURGERIES         History reviewed. No pertinent family history.  Social History   Tobacco Use   Smoking status: Every Day    Packs/day: 1.00    Years: 10.00    Pack years: 10.00    Types: Cigarettes   Smokeless tobacco: Never  Vaping Use   Vaping Use: Former  Substance Use Topics   Alcohol use: Yes    Comment: Occassionally    Drug use: No    Home Medications Prior to Admission medications   Medication Sig Start Date End Date Taking? Authorizing Provider  HYDROcodone-acetaminophen (NORCO/VICODIN) 5-325 MG tablet Take 2 tablets by mouth every 4 (four) hours as needed. 07/12/18   Triplett, Tammy, PA-C  HYDROcodone-acetaminophen (NORCO/VICODIN) 5-325 MG tablet Take 1 tablet by mouth every 4 (four) hours as needed. 07/12/18   Triplett, Tammy, PA-C  ibuprofen  (ADVIL,MOTRIN) 800 MG tablet Take 1 tablet (800 mg total) by mouth 3 (three) times daily. 07/12/18   Triplett, Tammy, PA-C  naproxen (NAPROSYN) 375 MG tablet Take 1 tablet (375 mg total) by mouth 2 (two) times daily. 08/21/18   Felicie Morn, NP  penicillin v potassium (VEETID) 500 MG tablet Take 1 tablet (500 mg total) by mouth 3 (three) times daily. 07/12/18   Triplett, Tammy, PA-C    Allergies    Patient has no known allergies.  Review of Systems   Review of Systems  Constitutional:  Negative for chills and fever.  HENT:  Negative for ear pain and sore throat.   Eyes:  Positive for visual disturbance.  Respiratory:  Negative for cough and shortness of breath.   Cardiovascular:  Negative for chest pain.  Gastrointestinal:  Negative for abdominal pain, constipation, diarrhea, nausea and vomiting.  Genitourinary:  Negative for dysuria and hematuria.  Musculoskeletal:  Negative for back pain.  Skin:  Negative for rash.  Neurological:  Positive for headaches. Negative for dizziness, weakness, light-headedness and numbness.  All other systems reviewed and are negative.  Physical Exam Updated Vital Signs BP (!) 157/110 (BP Location: Right Wrist)   Pulse 67   Temp 98.1 F (36.7 C) (Oral)   Resp 20   Ht 6\' 2"  (1.88 m)   Wt 129.3 kg  SpO2 99%   BMI 36.59 kg/m   Physical Exam Vitals and nursing note reviewed.  Constitutional:      Appearance: He is well-developed.  HENT:     Head: Normocephalic and atraumatic.  Eyes:     Conjunctiva/sclera: Conjunctivae normal.  Cardiovascular:     Rate and Rhythm: Normal rate and regular rhythm.     Heart sounds: Normal heart sounds. No murmur heard. Pulmonary:     Effort: Pulmonary effort is normal. No respiratory distress.     Breath sounds: Normal breath sounds. No wheezing, rhonchi or rales.  Abdominal:     General: Bowel sounds are normal.     Palpations: Abdomen is soft.     Tenderness: There is no abdominal tenderness. There is no  guarding or rebound.  Musculoskeletal:     Cervical back: Neck supple.  Skin:    General: Skin is warm and dry.  Neurological:     Mental Status: He is alert.     Comments: Mental Status:  Alert, thought content appropriate, able to give a coherent history. Speech fluent without evidence of aphasia. Able to follow 2 step commands without difficulty.  Cranial Nerves:  II:  pupils equal, round, reactive to light III,IV, VI: ptosis not present, extra-ocular motions intact bilaterally  V,VII: smile symmetric, facial light touch sensation equal VIII: hearing grossly normal to voice  X: uvula elevates symmetrically  XI: bilateral shoulder shrug symmetric and strong XII: midline tongue extension without fassiculations Motor:  Normal tone. 5/5 strength of BUE and BLE major muscle groups including strong and equal grip strength and dorsiflexion/plantar flexion Sensory: light touch normal in all extremities.     ED Results / Procedures / Treatments   Labs (all labs ordered are listed, but only abnormal results are displayed) Labs Reviewed - No data to display  EKG None  Radiology CT HEAD WO CONTRAST ( )  Result Date: 02/05/2021 CLINICAL DATA:  Migraine headache and blurry vision in the left eye. EXAM: CT HEAD WITHOUT CONTRAST TECHNIQUE: Contiguous axial images were obtained from the base of the skull through the vertex without intravenous contrast. COMPARISON:  None. FINDINGS: Brain: No evidence of acute infarction, hemorrhage, hydrocephalus, extra-axial collection or mass lesion/mass effect. Vascular: No hyperdense vessel or unexpected calcification. Skull: Normal. Negative for fracture or focal lesion. Sinuses/Orbits: No acute finding. Other: None. IMPRESSION: No acute intracranial pathology. Electronically Signed   By: Aram Candela M.D.   On: 02/05/2021 20:25    Procedures Procedures   Medications Ordered in ED Medications  ketorolac (TORADOL) 15 MG/ML injection 15 mg (has no  administration in time range)  metoCLOPramide (REGLAN) injection 10 mg (has no administration in time range)  diphenhydrAMINE (BENADRYL) injection 12.5 mg (has no administration in time range)  sodium chloride 0.9 % bolus 1,000 mL (has no administration in time range)    ED Course  I have reviewed the triage vital signs and the nursing notes.  Pertinent labs & imaging results that were available during my care of the patient were reviewed by me and considered in my medical decision making (see chart for details).    MDM Rules/Calculators/A&P                          35 y/o male presents for eval of headache that started 4 days ago. Pain constant in nature. Did have some vision changes starting yesterday.   Ct head ordered in triage was reviewed/interpreted and is neg for  acute intracranial pathology   Pt was given a migraine cocktail in the ED.   At shift change, pt is pending reassessment after migraine cocktail. Care transitioned to Dr. Clayborne Dana.   Final Clinical Impression(s) / ED Diagnoses Final diagnoses:  Acute nonintractable headache, unspecified headache type    Rx / DC Orders ED Discharge Orders     None        Rayne Du 02/05/21 2245    Mesner, Barbara Cower, MD 02/05/21 2328

## 2021-02-05 NOTE — ED Triage Notes (Signed)
Pt arrived via POV c/o migraine and blurry vision in left eye. Pt ambulatory A+O X4. Pts migraine located anterior behind left eye. Pt tried Tylenol and Motrin at home w/o relief.

## 2021-02-05 NOTE — Discharge Instructions (Signed)
You may alternate taking Tylenol and Naproxen as needed for pain control. You may take Naproxen twice daily as directed on your discharge paperwork and you may take  [phone] mg of Tylenol every 6 hours. Do not exceed 4000 mg of Tylenol daily as this can lead to liver damage. Also, make sure to take Naproxen with meals as it can cause an upset stomach. Do not take other NSAIDs while taking Naproxen such as (Aleve, Ibuprofen, Aspirin, Celebrex, etc) and do not take more than the prescribed dose as this can lead to ulcers and bleeding in your GI tract. You may use warm and cold compresses to help with your symptoms.   Please follow up with Dr. Judithann Sheen within the next 7-10 days for re-evaluation and further treatment of your symptoms.   Please return to the ER sooner if you have any new or worsening symptoms.

## 2021-02-05 NOTE — ED Provider Notes (Signed)
Headache improved but still can't see very well out of his left eye. IOP is 22. Pupils restrict. Does have some pain with movement in his left eye. Concern for optic neuritis, but esr/crp normal.   Attempted to consult teleneurology. Told he was 'in line'. Apparently patient became upset at some point in his stay and started yelling at nurses and left AMA.    Faydra Korman, Corene Cornea, MD 02/06/21 [phone]

## 2021-02-06 ENCOUNTER — Other Ambulatory Visit: Payer: Self-pay

## 2021-02-06 ENCOUNTER — Inpatient Hospital Stay (HOSPITAL_COMMUNITY)
Admission: EM | Admit: 2021-02-06 | Discharge: 2021-02-08 | DRG: 123 | Disposition: A | Discharge status: Home / Self Care | Payer: BC Managed Care – PPO | Attending: Internal Medicine | Admitting: Internal Medicine

## 2021-02-06 ENCOUNTER — Emergency Department (HOSPITAL_COMMUNITY): Payer: BC Managed Care – PPO

## 2021-02-06 ENCOUNTER — Encounter (HOSPITAL_COMMUNITY): Payer: Self-pay | Admitting: *Deleted

## 2021-02-06 DIAGNOSIS — Z720 Tobacco use: Secondary | ICD-10-CM | POA: Diagnosis not present

## 2021-02-06 DIAGNOSIS — T380X5A Adverse effect of glucocorticoids and synthetic analogues, initial encounter: Secondary | ICD-10-CM | POA: Diagnosis not present

## 2021-02-06 DIAGNOSIS — E612 Magnesium deficiency: Secondary | ICD-10-CM | POA: Diagnosis not present

## 2021-02-06 DIAGNOSIS — R9431 Abnormal electrocardiogram [ECG] [EKG]: Secondary | ICD-10-CM | POA: Diagnosis not present

## 2021-02-06 DIAGNOSIS — I471 Supraventricular tachycardia, unspecified: Secondary | ICD-10-CM | POA: Clinically undetermined

## 2021-02-06 DIAGNOSIS — D72829 Elevated white blood cell count, unspecified: Secondary | ICD-10-CM | POA: Diagnosis not present

## 2021-02-06 DIAGNOSIS — G43909 Migraine, unspecified, not intractable, without status migrainosus: Secondary | ICD-10-CM | POA: Diagnosis present

## 2021-02-06 DIAGNOSIS — H547 Unspecified visual loss: Secondary | ICD-10-CM

## 2021-02-06 DIAGNOSIS — D72828 Other elevated white blood cell count: Secondary | ICD-10-CM | POA: Diagnosis not present

## 2021-02-06 DIAGNOSIS — R519 Headache, unspecified: Secondary | ICD-10-CM

## 2021-02-06 DIAGNOSIS — E669 Obesity, unspecified: Secondary | ICD-10-CM | POA: Diagnosis present

## 2021-02-06 DIAGNOSIS — Z20822 Contact with and (suspected) exposure to covid-19: Secondary | ICD-10-CM | POA: Diagnosis present

## 2021-02-06 DIAGNOSIS — Z8249 Family history of ischemic heart disease and other diseases of the circulatory system: Secondary | ICD-10-CM | POA: Diagnosis not present

## 2021-02-06 DIAGNOSIS — H469 Unspecified optic neuritis: Principal | ICD-10-CM

## 2021-02-06 DIAGNOSIS — Z9049 Acquired absence of other specified parts of digestive tract: Secondary | ICD-10-CM | POA: Diagnosis not present

## 2021-02-06 DIAGNOSIS — R030 Elevated blood-pressure reading, without diagnosis of hypertension: Secondary | ICD-10-CM | POA: Diagnosis not present

## 2021-02-06 DIAGNOSIS — Z791 Long term (current) use of non-steroidal anti-inflammatories (NSAID): Secondary | ICD-10-CM

## 2021-02-06 DIAGNOSIS — Z8669 Personal history of other diseases of the nervous system and sense organs: Secondary | ICD-10-CM | POA: Diagnosis present

## 2021-02-06 DIAGNOSIS — Z6836 Body mass index (BMI) 36.0-36.9, adult: Secondary | ICD-10-CM | POA: Diagnosis not present

## 2021-02-06 DIAGNOSIS — M79652 Pain in left thigh: Secondary | ICD-10-CM | POA: Diagnosis not present

## 2021-02-06 DIAGNOSIS — F17210 Nicotine dependence, cigarettes, uncomplicated: Secondary | ICD-10-CM | POA: Diagnosis present

## 2021-02-06 HISTORY — DX: Elevated white blood cell count, unspecified: D72.829

## 2021-02-06 LAB — RESP PANEL BY RT-PCR (FLU A&B, COVID) ARPGX2
Influenza A by PCR: NEGATIVE
Influenza B by PCR: NEGATIVE
SARS Coronavirus 2 by RT PCR: NEGATIVE

## 2021-02-06 LAB — SEDIMENTATION RATE: Sed Rate: 10 mm/hr (ref 0–16)

## 2021-02-06 MED ORDER — SODIUM CHLORIDE 0.9 % IV SOLN
1000.0000 mg | Freq: Once | INTRAVENOUS | Status: AC
  Administered 2021-02-06: 1000 mg via INTRAVENOUS
  Filled 2021-02-06: qty 8

## 2021-02-06 MED ORDER — TETRACAINE HCL 0.5 % OP SOLN
1.0000 [drp] | Freq: Once | OPHTHALMIC | Status: DC
  Filled 2021-02-06: qty 4

## 2021-02-06 MED ORDER — ACETAMINOPHEN 500 MG PO TABS
1000.0000 mg | ORAL_TABLET | Freq: Once | ORAL | Status: AC
  Administered 2021-02-06: 1000 mg via ORAL
  Filled 2021-02-06: qty 2

## 2021-02-06 MED ORDER — DIPHENHYDRAMINE HCL 50 MG/ML IJ SOLN
25.0000 mg | Freq: Once | INTRAMUSCULAR | Status: AC
  Administered 2021-02-06: 25 mg via INTRAVENOUS
  Filled 2021-02-06: qty 1

## 2021-02-06 MED ORDER — ENOXAPARIN SODIUM 60 MG/0.6ML IJ SOSY
60.0000 mg | PREFILLED_SYRINGE | INTRAMUSCULAR | Status: DC
  Filled 2021-02-06 (×2): qty 0.6

## 2021-02-06 MED ORDER — ACETAMINOPHEN 325 MG PO TABS
650.0000 mg | ORAL_TABLET | Freq: Four times a day (QID) | ORAL | Status: DC | PRN
  Administered 2021-02-06: 650 mg via ORAL
  Filled 2021-02-06: qty 2

## 2021-02-06 MED ORDER — TRAZODONE HCL 50 MG PO TABS
50.0000 mg | ORAL_TABLET | Freq: Once | ORAL | Status: AC
  Administered 2021-02-06: 50 mg via ORAL
  Filled 2021-02-06: qty 1

## 2021-02-06 MED ORDER — METOCLOPRAMIDE HCL 5 MG/ML IJ SOLN
10.0000 mg | Freq: Once | INTRAMUSCULAR | Status: AC
  Administered 2021-02-06: 10 mg via INTRAVENOUS
  Filled 2021-02-06: qty 2

## 2021-02-06 MED ORDER — GADOBUTROL 1 MMOL/ML IV SOLN
10.0000 mL | Freq: Once | INTRAVENOUS | Status: AC | PRN
  Administered 2021-02-06: 10 mL via INTRAVENOUS

## 2021-02-06 MED ORDER — NICOTINE 21 MG/24HR TD PT24
21.0000 mg | MEDICATED_PATCH | Freq: Every day | TRANSDERMAL | Status: DC
  Administered 2021-02-06 – 2021-02-07 (×2): 21 mg via TRANSDERMAL
  Filled 2021-02-06 (×3): qty 1

## 2021-02-06 MED ORDER — METHYLPREDNISOLONE SODIUM SUCC 1000 MG IJ SOLR
INTRAMUSCULAR | Status: AC
  Filled 2021-02-06: qty 8

## 2021-02-06 MED ORDER — SODIUM CHLORIDE 0.9 % IV SOLN
1000.0000 mg | INTRAVENOUS | Status: DC
  Administered 2021-02-07 – 2021-02-08 (×2): 1000 mg via INTRAVENOUS
  Filled 2021-02-06 (×3): qty 8

## 2021-02-06 NOTE — ED Notes (Signed)
Pt seen walking out towards waiting room with family member.

## 2021-02-06 NOTE — H&P (Signed)
History and Physical  Ian Gonzalez WVP:[id number] DOB: 08-04-1985 DOA: 02/06/2021  Referring physician: Marcille Buffy PCP: Patient, No Pcp Per (Inactive)  Patient coming from: Home  Chief Complaint: Left blurry vision  HPI: Ian Gonzalez is a 35 y.o. male with medical history significant for obesity and tobacco use who presents to the emergency department due to 3-day onset of left-sided headache and 2-day onset of left blurry vision.  He complained of left-sided headache which started on Friday (8/26) afternoon, headache was described as pressure-like on the left side of the head and patient thought that it was a migraine, he went to bed with a headache on Friday night, on waking up on Saturday morning, he noted a left-sided blurry vision which has been persistent since onset.  Patient denies use of glasses, trauma or infection.  He states that he had a similar headache in the past, but it was not associated with blurry vision at that time.  He denies chest pain, shortness of breath, fever, nausea, vomiting, alcohol use or use of any recreational drugs.  ED Course:  In the emergency department, he was hemodynamically stable, BP was 153/82.  Work-up in the ED showed normal CBC except for leukocytosis.  Normal BMP, CRP and sed rate within normal range.  Influenza A, B, SARS coronavirus 2 was negative. MRI head and orbits without and with contrast was consistent with optic neuritis, but showed normal MRI of the brain.  CT of head without contrast showed no acute intracranial pathology Neurologist at [facility] (Dr. Amada Jupiter) was consulted and recommended MRI (reported above) and suggested IV Solu-Medrol 1 g x 5 days if positive for optic neuritis.  Patient was treated with Tylenol, Benadryl, Gadavist and Reglan.  Solu-Medrol 1 g was given in the ED.  Hospitalist was asked to admit.  For further evaluation and management.  Review of Systems: Constitutional: Negative for chills and fever.  HENT:  Negative for ear pain and sore throat.   Eyes: Positive for left blurry vision.  Negative for pain Respiratory: Negative for cough, chest tightness and shortness of breath.   Cardiovascular: Negative for chest pain and palpitations.  Gastrointestinal: Negative for abdominal pain and vomiting.  Endocrine: Negative for polyphagia and polyuria.  Genitourinary: Negative for decreased urine volume, dysuria, enuresis Musculoskeletal: Negative for arthralgias and back pain.  Skin: Negative for color change and rash.  Allergic/Immunologic: Negative for immunocompromised state.  Neurological: Positive for headache.  Negative for tremors, syncope, speech difficulty All other systems reviewed and are negative   Past Medical History:  Diagnosis Date   Bipolar 1 disorder (HCC)    Depression    Sleep disorder    Past Surgical History:  Procedure Laterality Date   CHOLECYSTECTOMY N/A 03/18/2015   Procedure: LAPAROSCOPIC CHOLECYSTECTOMY;  Surgeon: Franky Macho, MD;  Location: [facility];  Service: General;  Laterality: N/A;   NO PAST SURGERIES      Social History:  reports that he has been smoking cigarettes. He has a 10.00 pack-year smoking history. He has never used smokeless tobacco. He reports current alcohol use. He reports that he does not use drugs.   No Known Allergies  History reviewed. No pertinent family history.   Prior to Admission medications   Medication Sig Start Date End Date Taking? Authorizing Provider  HYDROcodone-acetaminophen (NORCO/VICODIN) 5-325 MG tablet Take 2 tablets by mouth every 4 (four) hours as needed. 07/12/18   Triplett, Tammy, PA-C  HYDROcodone-acetaminophen (NORCO/VICODIN) 5-325 MG tablet Take 1 tablet by  mouth every 4 (four) hours as needed. 07/12/18   Triplett, Tammy, PA-C  ibuprofen (ADVIL,MOTRIN) 800 MG tablet Take 1 tablet (800 mg total) by mouth 3 (three) times daily. 07/12/18   Triplett, Tammy, PA-C  naproxen (NAPROSYN) 375 MG tablet Take 1 tablet (375 mg total)  by mouth 2 (two) times daily. 08/21/18   Felicie Morn, NP  penicillin v potassium (VEETID) 500 MG tablet Take 1 tablet (500 mg total) by mouth 3 (three) times daily. 07/12/18   Pauline Aus, PA-C    Physical Exam: BP (!) 171/99   Pulse 69   Temp 98.7 F (37.1 C) (Oral)   Resp 18   Ht 6\' 2"  (1.88 m)   Wt 129.3 kg   SpO2 98%   BMI 36.59 kg/m   General: 35 y.o. year-old male well developed well nourished in no acute distress.  Alert and oriented x3. HEENT: NCAT, EOMI Neck: Supple, trachea medial Cardiovascular: Regular rate and rhythm with no rubs or gallops.  No thyromegaly or JVD noted.  No lower extremity edema. 2/4 pulses in all 4 extremities. Respiratory: Clear to auscultation with no wheezes or rales. Good inspiratory effort. Abdomen: Soft, nontender nondistended with normal bowel sounds x4 quadrants. Muskuloskeletal: No cyanosis, clubbing or edema noted bilaterally Neuro: CN II-XII intact, strength 5/5 x 4, sensation, reflexes intact Skin: No ulcerative lesions noted or rashes Psychiatry: Judgement and insight appear normal. Mood is appropriate for condition and setting          Labs on Admission:  Basic Metabolic Panel: Recent Labs  Lab 02/05/21 2318  NA 135  K 3.8  CL 103  CO2 26  GLUCOSE 105*  BUN 9  CREATININE 0.62  CALCIUM 8.8*   Liver Function Tests: Recent Labs  Lab 02/05/21 2318  AST 17  ALT 17  ALKPHOS 70  BILITOT 0.4  PROT 7.2  ALBUMIN 3.8   No results for input(s): LIPASE, AMYLASE in the last 168 hours. No results for input(s): AMMONIA in the last 168 hours. CBC: Recent Labs  Lab 02/05/21 2318  WBC 12.5*  NEUTROABS 8.9*  HGB 13.8  HCT 43.0  MCV 84.8  PLT 261   Cardiac Enzymes: No results for input(s): CKTOTAL, CKMB, CKMBINDEX, TROPONINI in the last 168 hours.  BNP (last 3 results) No results for input(s): BNP in the last 8760 hours.  ProBNP (last 3 results) No results for input(s): PROBNP in the last 8760 hours.  CBG: No  results for input(s): GLUCAP in the last 168 hours.  Radiological Exams on Admission: CT HEAD WO CONTRAST (02/07/21)  Result Date: 02/05/2021 CLINICAL DATA:  Migraine headache and blurry vision in the left eye. EXAM: CT HEAD WITHOUT CONTRAST TECHNIQUE: Contiguous axial images were obtained from the base of the skull through the vertex without intravenous contrast. COMPARISON:  None. FINDINGS: Brain: No evidence of acute infarction, hemorrhage, hydrocephalus, extra-axial collection or mass lesion/mass effect. Vascular: No hyperdense vessel or unexpected calcification. Skull: Normal. Negative for fracture or focal lesion. Sinuses/Orbits: No acute finding. Other: None. IMPRESSION: No acute intracranial pathology. Electronically Signed   By: 02/07/2021 M.D.   On: 02/05/2021 20:25   MR Brain W and Wo Contrast  Result Date: 02/06/2021 CLINICAL DATA:  Monocular vision loss, left.  Headache. EXAM: MRI HEAD AND ORBITS WITHOUT AND WITH CONTRAST TECHNIQUE: Multiplanar, multiecho pulse sequences of the brain and surrounding structures were obtained without and with intravenous contrast. Multiplanar, multiecho pulse sequences of the orbits and surrounding structures were obtained including fat  saturation techniques, before and after intravenous contrast administration. CONTRAST:  98mL GADAVIST GADOBUTROL 1 MMOL/ML IV SOLN COMPARISON:  None. FINDINGS: MRI HEAD FINDINGS Brain: No acute infarct, mass effect or extra-axial collection. No acute or chronic hemorrhage. Normal white matter signal, parenchymal volume and CSF spaces. The midline structures are normal. The postcontrast images are severely motion degraded. Vascular: Major flow voids are preserved. Skull and upper cervical spine: Normal calvarium and skull base. Visualized upper cervical spine and soft tissues are normal. MRI ORBITS FINDINGS Orbits: There is abnormal contrast enhancement and enlargement of the left optic nerve. Extraocular muscles are normal. Both  lobes are normal. The right optic nerve is normal. Visualized sinuses: Clear Soft tissues: Normal IMPRESSION: 1. Abnormal contrast enhancement and enlargement of the left optic nerve, consistent with optic neuritis. 2. Normal MRI of the brain. Electronically Signed   By: Deatra Robinson M.D.   On: 02/06/2021 19:18   MR ORBITS W WO CONTRAST  Result Date: 02/06/2021 CLINICAL DATA:  Monocular vision loss, left.  Headache. EXAM: MRI HEAD AND ORBITS WITHOUT AND WITH CONTRAST TECHNIQUE: Multiplanar, multiecho pulse sequences of the brain and surrounding structures were obtained without and with intravenous contrast. Multiplanar, multiecho pulse sequences of the orbits and surrounding structures were obtained including fat saturation techniques, before and after intravenous contrast administration. CONTRAST:  70mL GADAVIST GADOBUTROL 1 MMOL/ML IV SOLN COMPARISON:  None. FINDINGS: MRI HEAD FINDINGS Brain: No acute infarct, mass effect or extra-axial collection. No acute or chronic hemorrhage. Normal white matter signal, parenchymal volume and CSF spaces. The midline structures are normal. The postcontrast images are severely motion degraded. Vascular: Major flow voids are preserved. Skull and upper cervical spine: Normal calvarium and skull base. Visualized upper cervical spine and soft tissues are normal. MRI ORBITS FINDINGS Orbits: There is abnormal contrast enhancement and enlargement of the left optic nerve. Extraocular muscles are normal. Both lobes are normal. The right optic nerve is normal. Visualized sinuses: Clear Soft tissues: Normal IMPRESSION: 1. Abnormal contrast enhancement and enlargement of the left optic nerve, consistent with optic neuritis. 2. Normal MRI of the brain. Electronically Signed   By: Deatra Robinson M.D.   On: 02/06/2021 19:18    EKG: I independently viewed the EKG done and my findings are as followed: EKG was not done in the ED  Assessment/Plan Present on Admission:  Optic  neuritis  Principal Problem:   Optic neuritis Active Problems:   Leukocytosis   Obesity (BMI 30-39.9)   Tobacco use   Left-sided optic neuritis MRI was consistent with optic neuritis Neurologist (Dr. Amada Jupiter at [facility]) was consulted by ED physician and recommended 1 g of Solu-Medrol daily for 5 days. Continue Solu-Medrol per neurology recommendation Neurology will be consulted and we shall await further recommendation  Leukocytosis possibly reactive WBC 12.5, continue to monitor WBC with morning labs  Type II obesity (BMI 36.59) Patient counseled on diet and lifestyle modification  Tobacco use Patient endorsed at least 5-year pack history of tobacco use Patient was counseled on tobacco abuse cessation Nicotine patch will be provided   DVT prophylaxis: Lovenox, SCDs  Code Status: Full code  Family Communication: None at bedside  Disposition Plan:  Patient is from:                        home Anticipated DC to:                   SNF or family members home Anticipated DC  date:               2-3 days Anticipated DC barriers:         patient requires inpatient management due to optic neuritis  Consults called: Neurology  Admission status: Inpatient   Frankey Shownladapo Fatina Sprankle MD [facility]  02/06/2021, 9:55 PM

## 2021-02-06 NOTE — Plan of Care (Signed)
Discussed by phone with ER team. He has left sided "headache" and blurred vision for several days. I would be concerned about optic neuritis and have recommended MRI brain/orbits w/wo contrast. If this gives evidence for optic neuritis, he will need admission for IV solumedrol, 1g daily x 5 days. If it is normal, I would consider expedited opthalmology evaluation to rule out other causes prior to consideration of IV solumedrol.   Ritta Slot, MD [facility] [phone]  If 7pm- 7am, please page neurology on call as listed in AMION.

## 2021-02-06 NOTE — ED Notes (Signed)
hospitalist at bedside

## 2021-02-06 NOTE — ED Notes (Signed)
Went into treatment room to update VS. Pt nor pts family present in room. Per screener pt and family member visualized walking out of ED. Pt had 18G IV in place.   A few minutes later pt and visitor walked back into ED with IV in place. Pt now back into treatment room.

## 2021-02-06 NOTE — ED Provider Notes (Signed)
Pt turned over to me at 7:00pm.  MRi pending for evaluation of optic neuritis.   Pt's Mri returned and pt does have Optic neuritis.   Hospitalist consulted for admission    Osie Cheeks 02/06/21 1939    Cathren Laine, MD 02/07/21 [phone]

## 2021-02-06 NOTE — ED Provider Notes (Signed)
Emergency Medicine Provider Triage Evaluation Note  Ian Gonzalez , a 35 y.o. male  was evaluated in triage.  Pt complains of left eye blurring and pressure without pain since friday. He also complains of some headache. Patient denies any other neurologic findings. He was seen last night and received a full work up but left before being seen by tele-neuro consult.  Review of Systems  Positive: Eye blurring, headache Negative: Numbness, tingling, weakness, facial drooping  Physical Exam  BP (!) 153/82 (BP Location: Right Arm)   Pulse 68   Temp 98.4 F (36.9 C) (Oral)   Resp 19   Ht 6\' 2"  (1.88 m)   Wt 129.3 kg   SpO2 100%   BMI 36.59 kg/m  Gen:   Awake, no distress   Resp:  Normal effort  MSK:   Moves extremities without difficulty  Other:  No carotid artery bruit  Medical Decision Making  Medically screening exam initiated at 2:20 PM.  Appropriate orders placed.  was informed that the remainder of the evaluation will be completed by another provider, this initial triage assessment does not replace that evaluation, and the importance of remaining in the ED until their evaluation is complete.  Eye blurring, needs teleneuro consult from last night it sounds like   Ian Gonzalez 02/06/21 1422    02/08/21, MD 02/07/21 [phone]

## 2021-02-06 NOTE — ED Notes (Signed)
EDPA in triage room to see pt.

## 2021-02-06 NOTE — ED Provider Notes (Signed)
[facility] EMERGENCY DEPARTMENT Provider Note   CSN: [id number] Arrival date & time: 02/06/21  1406     History Chief Complaint  Patient presents with   Eye Problem    Ian Gonzalez is a 35 y.o. male.  HPI  Patient with significant medical history of bipolar, depression, sleep disorder presents to the emergency department with chief complaint of left-sided headache and left visual changes.  Patient states he developed a headache on Friday, states the headache came on gradually, states he feels a pressure-like sensation on the left side of his head and behind his left eye, he endorses left visual changes, states he has a hard time seeing of his left eye as it is blurry, he denies actual eye pain, does not wear eye contacts does not wear glasses, denies any trauma to his eye.  He denies head trauma, is not on anticoagulant, he denies history of IV drug use, has no associated fevers or chills, no chest pain or shortness of breath.  States he has had headaches in the past has never had a migraine before.  He denies alleviating factors.    He was recently seen in the emergency department last night, he had a benign work-up, he was given a migraine cocktail states he was feeling better, a neurology consult was placed due to persistent left visual changes but patient became upset and left AMA.  He had an intraocular pressure of 22 at that time.  Past Medical History:  Diagnosis Date   Bipolar 1 disorder (HCC)    Depression    Sleep disorder     There are no problems to display for this patient.   Past Surgical History:  Procedure Laterality Date   CHOLECYSTECTOMY N/A 03/18/2015   Procedure: LAPAROSCOPIC CHOLECYSTECTOMY;  Surgeon: Franky Macho, MD;  Location: [facility];  Service: General;  Laterality: N/A;   NO PAST SURGERIES         History reviewed. No pertinent family history.  Social History   Tobacco Use   Smoking status: Every Day    Packs/day: 1.00    Years: 10.00     Pack years: 10.00    Types: Cigarettes   Smokeless tobacco: Never  Vaping Use   Vaping Use: Former  Substance Use Topics   Alcohol use: Yes    Comment: Occassionally    Drug use: No    Home Medications Prior to Admission medications   Medication Sig Start Date End Date Taking? Authorizing Provider  HYDROcodone-acetaminophen (NORCO/VICODIN) 5-325 MG tablet Take 2 tablets by mouth every 4 (four) hours as needed. 07/12/18   Triplett, Tammy, PA-C  HYDROcodone-acetaminophen (NORCO/VICODIN) 5-325 MG tablet Take 1 tablet by mouth every 4 (four) hours as needed. 07/12/18   Triplett, Tammy, PA-C  ibuprofen (ADVIL,MOTRIN) 800 MG tablet Take 1 tablet (800 mg total) by mouth 3 (three) times daily. 07/12/18   Triplett, Tammy, PA-C  naproxen (NAPROSYN) 375 MG tablet Take 1 tablet (375 mg total) by mouth 2 (two) times daily. 08/21/18   Felicie Morn, NP  penicillin v potassium (VEETID) 500 MG tablet Take 1 tablet (500 mg total) by mouth 3 (three) times daily. 07/12/18   Triplett, Tammy, PA-C    Allergies    Patient has no known allergies.  Review of Systems   Review of Systems  Constitutional:  Negative for chills and fever.  HENT:  Negative for congestion.   Eyes:  Positive for visual disturbance. Negative for pain and redness.  Respiratory:  Negative for  shortness of breath.   Cardiovascular:  Negative for chest pain.  Gastrointestinal:  Negative for abdominal pain.  Genitourinary:  Negative for enuresis.  Musculoskeletal:  Negative for back pain.  Skin:  Negative for rash.  Neurological:  Positive for headaches. Negative for dizziness.  Hematological:  Does not bruise/bleed easily.   Physical Exam Updated Vital Signs BP (!) 143/83   Pulse 73   Temp 98.7 F (37.1 C) (Oral)   Resp 18   Ht 6\' 2"  (1.88 m)   Wt 129.3 kg   SpO2 99%   BMI 36.59 kg/m   Physical Exam Vitals and nursing note reviewed.  Constitutional:      General: He is not in acute distress.    Appearance: He is not  ill-appearing.  HENT:     Head: Normocephalic and atraumatic.     Nose: No congestion.     Mouth/Throat:     Mouth: Mucous membranes are moist.     Pharynx: Oropharynx is clear.  Eyes:     General: No visual field deficit.    Extraocular Movements: Extraocular movements intact.     Conjunctiva/sclera: Conjunctivae normal.     Pupils: Pupils are equal, round, and reactive to light.     Comments: Patient had a negative RAPD, eyes were PERRLA, EOMs fully intact, no ptosis no injections noted of the sclera, no blood noted in the anterior trim the eye, no dendritic lesion present.  Cardiovascular:     Rate and Rhythm: Normal rate and regular rhythm.     Pulses: Normal pulses.     Heart sounds: No murmur heard.   No friction rub. No gallop.  Pulmonary:     Effort: No respiratory distress.     Breath sounds: No wheezing, rhonchi or rales.  Skin:    General: Skin is warm and dry.  Neurological:     Mental Status: He is alert.     GCS: GCS eye subscore is 4. GCS verbal subscore is 5. GCS motor subscore is 6.     Cranial Nerves: No facial asymmetry.     Sensory: Sensation is intact.     Motor: No weakness.     Coordination: Romberg sign negative. Finger-Nose-Finger Test normal.     Gait: Gait normal.     Comments: Cranial nerves II through XII grossly intact, patient noted word finding, no slurring of his words, able follows two-step commands, gait fully intact.  Psychiatric:        Mood and Affect: Mood normal.    ED Results / Procedures / Treatments   Labs (all labs ordered are listed, but only abnormal results are displayed) Labs Reviewed - No data to display  EKG None  Radiology CT HEAD WO CONTRAST ( )  Result Date: 02/05/2021 CLINICAL DATA:  Migraine headache and blurry vision in the left eye. EXAM: CT HEAD WITHOUT CONTRAST TECHNIQUE: Contiguous axial images were obtained from the base of the skull through the vertex without intravenous contrast. COMPARISON:  None.  FINDINGS: Brain: No evidence of acute infarction, hemorrhage, hydrocephalus, extra-axial collection or mass lesion/mass effect. Vascular: No hyperdense vessel or unexpected calcification. Skull: Normal. Negative for fracture or focal lesion. Sinuses/Orbits: No acute finding. Other: None. IMPRESSION: No acute intracranial pathology. Electronically Signed   By: 02/07/2021 M.D.   On: 02/05/2021 20:25    Procedures Procedures   Medications Ordered in ED Medications  gadobutrol (GADAVIST) 1 MMOL/ML injection 10 mL (has no administration in time range)  tetracaine (PONTOCAINE) 0.5 %  ophthalmic solution 1 drop (has no administration in time range)  metoCLOPramide (REGLAN) injection 10 mg (10 mg Intravenous Given 02/06/21 1811)  diphenhydrAMINE (BENADRYL) injection 25 mg (25 mg Intravenous Given 02/06/21 1807)  acetaminophen (TYLENOL) tablet 1,000 mg (1,000 mg Oral Given 02/06/21 1806)    ED Course  I have reviewed the triage vital signs and the nursing notes.  Pertinent labs & imaging results that were available during my care of the patient were reviewed by me and considered in my medical decision making (see chart for details).    MDM Rules/Calculators/A&P                          Initial impression-patient presents with a left-sided headache as well as left visual changes.  He is alert, does not appear acute stress, vital signs reassuring.  Unclear etiology, will consult with neurology for further recommendations.  Work-up-  Visual Acuity  Right Eye Distance: 20/20 Left Eye Distance: 20/200 Bilateral Distance: 20/20  Consult-spoke with Dr. Amada Jupiter with neurology, he is concerned for possible optic neuritis, he recommends obtaining MRI brain and orbits with and a contrast for further evaluation.  If negative patient can be discharged home but will need immediate follow-up with ophthalmology, if positive admission will be needed.  Rule out-low suspicion for CVA or intracranial head  bleed as he denies recent head trauma, is not on anticoagulant, there is no focal deficits present on exam, CT head from yesterday is negative for acute findings.  Low suspicion for meningitis as he has no meningeal sign, he is nontoxic-appearing, has low risk factors.  Low suspicion for cerebral or vertebral dissection/aneurysm as presentation is atypical, patient has low risk factors.  Low suspicion for glaucoma as he had a intraocular pressure yesterday of 22, second 1 is pending at this time.  Suspicion for ischemic retinal occlusion as patient has low risk factors, presentation is atypical.  Plan-  Due to shift change patient will handoff to [facility], PA-C she was provided HPI, current work-up, likely disposition.  Follow-up on MRI if consistent with optic neuritis patient can be admitted at [facility] and follow-up by neurology in the morning.  If negative patient can be discharged with prompt follow-up with ophthalmology.  Would recommend following up with intraocular pressure.  Final Clinical Impression(s) / ED Diagnoses Final diagnoses:  Acute nonintractable headache, unspecified headache type  Decreased visual acuity    Rx / DC Orders ED Discharge Orders     None        Carroll Sage, PA-C 02/06/21 1901    Cathren Laine, MD 02/07/21 [phone]

## 2021-02-06 NOTE — ED Notes (Signed)
Patient transported to MRI 

## 2021-02-06 NOTE — ED Triage Notes (Signed)
Pt with HA since Friday evening and left eye blurry vision since Saturday morning.  Seen here for same last night and left AMA.  Pt with continued left sided eye pressure.  Continued HA.

## 2021-02-06 NOTE — ED Notes (Signed)
Pt was with mother on curb smoking. Pt walked back to room unassisted stating he did not know he could not leave .

## 2021-02-06 NOTE — ED Notes (Signed)
edp in room  

## 2021-02-06 NOTE — ED Notes (Signed)
Pt upset due to waiting on teleneuro. Attempted to educated pt on process but pt still doesn't want to wait any longer. Pt signed out AMA.

## 2021-02-07 ENCOUNTER — Encounter (HOSPITAL_COMMUNITY): Payer: Self-pay | Admitting: Internal Medicine

## 2021-02-07 ENCOUNTER — Inpatient Hospital Stay (HOSPITAL_COMMUNITY): Payer: BC Managed Care – PPO

## 2021-02-07 DIAGNOSIS — I471 Supraventricular tachycardia: Secondary | ICD-10-CM

## 2021-02-07 DIAGNOSIS — R9431 Abnormal electrocardiogram [ECG] [EKG]: Secondary | ICD-10-CM

## 2021-02-07 DIAGNOSIS — R51 Headache with orthostatic component, not elsewhere classified: Secondary | ICD-10-CM | POA: Diagnosis not present

## 2021-02-07 DIAGNOSIS — H53139 Sudden visual loss, unspecified eye: Secondary | ICD-10-CM | POA: Diagnosis not present

## 2021-02-07 DIAGNOSIS — H469 Unspecified optic neuritis: Secondary | ICD-10-CM | POA: Diagnosis not present

## 2021-02-07 LAB — ECHOCARDIOGRAM COMPLETE
AR max vel: 2.91 cm2
AV Area VTI: 3.08 cm2
AV Area mean vel: 2.39 cm2
AV Mean grad: 9 mmHg
AV Peak grad: 17 mmHg
Ao pk vel: 2.06 m/s
Area-P 1/2: 4.74 cm2
Height: 74 in
MV VTI: 3.6 cm2
S' Lateral: 3.2 cm
Weight: 4560 oz

## 2021-02-07 LAB — COMPREHENSIVE METABOLIC PANEL
ALT: 19 U/L (ref 0–44)
AST: 19 U/L (ref 15–41)
Albumin: 4.2 g/dL (ref 3.5–5.0)
Alkaline Phosphatase: 77 U/L (ref 38–126)
Anion gap: 10 (ref 5–15)
BUN: 10 mg/dL (ref 6–20)
CO2: 24 mmol/L (ref 22–32)
Calcium: 9.5 mg/dL (ref 8.9–10.3)
Chloride: 100 mmol/L (ref 98–111)
Creatinine, Ser: 0.58 mg/dL — ABNORMAL LOW (ref 0.61–1.24)
GFR, Estimated: >60 mL/min (ref >60)
Glucose, Bld: 164 mg/dL — ABNORMAL HIGH (ref 70–99)
Potassium: 3.7 mmol/L (ref 3.5–5.1)
Sodium: 134 mmol/L — ABNORMAL LOW (ref 135–145)
Total Bilirubin: 0.7 mg/dL (ref 0.3–1.2)
Total Protein: 8 g/dL (ref 6.5–8.1)

## 2021-02-07 LAB — CBC
HCT: 45.3 % (ref 39.0–52.0)
Hemoglobin: 14.8 g/dL (ref 13.0–17.0)
MCH: 26.5 pg (ref 26.0–34.0)
MCHC: 32.7 g/dL (ref 30.0–36.0)
MCV: 81.2 fL (ref 80.0–100.0)
Platelets: 294 10*3/uL (ref 150–400)
RBC: 5.58 MIL/uL (ref 4.22–5.81)
RDW: 12.8 % (ref 11.5–15.5)
WBC: 12.9 10*3/uL — ABNORMAL HIGH (ref 4.0–10.5)
nRBC: 0 % (ref 0.0–0.2)

## 2021-02-07 LAB — PHOSPHORUS: Phosphorus: 3.4 mg/dL (ref 2.5–4.6)

## 2021-02-07 LAB — BASIC METABOLIC PANEL
Anion gap: 13 (ref 5–15)
BUN: 13 mg/dL (ref 6–20)
CO2: 21 mmol/L — ABNORMAL LOW (ref 22–32)
Calcium: 9.6 mg/dL (ref 8.9–10.3)
Chloride: 101 mmol/L (ref 98–111)
Creatinine, Ser: 0.68 mg/dL (ref 0.61–1.24)
GFR, Estimated: >60 mL/min (ref >60)
Glucose, Bld: 256 mg/dL — ABNORMAL HIGH (ref 70–99)
Potassium: 3.5 mmol/L (ref 3.5–5.1)
Sodium: 135 mmol/L (ref 135–145)

## 2021-02-07 LAB — PROTIME-INR
INR: 1 (ref 0.8–1.2)
Prothrombin Time: 13.3 seconds (ref 11.4–15.2)

## 2021-02-07 LAB — MAGNESIUM
Magnesium: 1.7 mg/dL (ref 1.7–2.4)
Magnesium: 1.5 mg/dL — ABNORMAL LOW (ref 1.7–2.4)

## 2021-02-07 LAB — HIV ANTIBODY (ROUTINE TESTING W REFLEX): HIV Screen 4th Generation wRfx: NONREACTIVE

## 2021-02-07 LAB — APTT: aPTT: 26 seconds (ref 24–36)

## 2021-02-07 MED ORDER — POTASSIUM CHLORIDE CRYS ER 20 MEQ PO TBCR
40.0000 meq | EXTENDED_RELEASE_TABLET | Freq: Once | ORAL | Status: AC
  Administered 2021-02-07: 40 meq via ORAL
  Filled 2021-02-07: qty 2

## 2021-02-07 MED ORDER — ALPRAZOLAM 0.5 MG PO TABS
0.5000 mg | ORAL_TABLET | ORAL | Status: AC
Start: 2021-02-07 — End: 2021-02-07
  Administered 2021-02-07: 0.5 mg via ORAL
  Filled 2021-02-07: qty 1

## 2021-02-07 MED ORDER — MAGNESIUM SULFATE 2 GM/50ML IV SOLN
2.0000 g | Freq: Once | INTRAVENOUS | Status: AC
  Administered 2021-02-07: 2 g via INTRAVENOUS
  Filled 2021-02-07: qty 50

## 2021-02-07 MED ORDER — DILTIAZEM HCL 25 MG/5ML IV SOLN: 5.0000 mg | Freq: Once | INTRAVENOUS | Status: DC

## 2021-02-07 MED ORDER — SODIUM CHLORIDE 0.9 % IV BOLUS
500.0000 mL | Freq: Once | INTRAVENOUS | Status: AC
  Administered 2021-02-07: 500 mL via INTRAVENOUS

## 2021-02-07 MED ORDER — HYDROCODONE-ACETAMINOPHEN 5-325 MG PO TABS
1.0000 | ORAL_TABLET | Freq: Four times a day (QID) | ORAL | Status: DC | PRN
  Administered 2021-02-07 – 2021-02-08 (×4): 1 via ORAL
  Filled 2021-02-07 (×4): qty 1

## 2021-02-07 MED ORDER — METOPROLOL SUCCINATE ER 25 MG PO TB24
25.0000 mg | ORAL_TABLET | Freq: Every day | ORAL | Status: DC
  Administered 2021-02-07 – 2021-02-08 (×2): 25 mg via ORAL
  Filled 2021-02-07 (×2): qty 1

## 2021-02-07 MED ORDER — DILTIAZEM HCL 25 MG/5ML IV SOLN
INTRAVENOUS | Status: AC
  Filled 2021-02-07: qty 5

## 2021-02-07 MED ORDER — PANTOPRAZOLE SODIUM 40 MG PO TBEC
40.0000 mg | DELAYED_RELEASE_TABLET | Freq: Two times a day (BID) | ORAL | Status: DC
  Administered 2021-02-07 – 2021-02-08 (×3): 40 mg via ORAL
  Filled 2021-02-07 (×3): qty 1

## 2021-02-07 MED ORDER — HYDRALAZINE HCL 25 MG PO TABS: 25.0000 mg | ORAL_TABLET | Freq: Four times a day (QID) | ORAL | Status: DC | PRN

## 2021-02-07 MED ORDER — QUETIAPINE FUMARATE 25 MG PO TABS
50.0000 mg | ORAL_TABLET | Freq: Every day | ORAL | Status: DC
  Administered 2021-02-07: 50 mg via ORAL
  Filled 2021-02-07: qty 2

## 2021-02-07 MED ORDER — AMLODIPINE BESYLATE 5 MG PO TABS
5.0000 mg | ORAL_TABLET | Freq: Every day | ORAL | Status: DC
  Administered 2021-02-07 – 2021-02-08 (×2): 5 mg via ORAL
  Filled 2021-02-07 (×2): qty 1

## 2021-02-07 NOTE — Progress Notes (Signed)
Patient rang out and stated that he felt like his heart was racing out of his chest and that his chest hurt. Upon entering the room the patient was sitting up on the side of the bed and appeared to be short of breath. EKG was performed which showed patient was in SVT with a heart rate of 161. This nurse notified MD, orders were given for 5mg /23ml of Cardizem, along with magnesium and a 0m bolus.   A second EKG was performed which showed sinus tach with a heart rate of 91. and rapid response team present at this time.   A third EKG was performed which showed normal sinus with a heart rate of 90. MD was made aware by RN, MD gave the okay to hold the Cardizem at this time.  Patient is on cardiac monitoring at this time and is sustaining in the 80's-90's NSR. Will continue to monitor.

## 2021-02-07 NOTE — Consult Note (Addendum)
Cardiology Consultation:   Patient ID: AXYL SITZMAN MRN: [id number]; DOB: 08-25-1985  Admit date: 02/06/2021 Date of Consult: 02/07/2021  PCP:  Patient, No Pcp Per (Inactive)   [place] Providers Cardiologist: New to [facility] - Dr. Domenic Polite  Patient Profile:   TYREZ BERRIOS is a 35 y.o. male with a hx of past medical history of Bipolar disorder and tobacco use who is being seen 02/07/2021 for the evaluation of SVT at the request of Dr. Tyrell Antonio.  History of Present Illness:   Mr. Grissinger recently presented to [facility] ED on 02/05/2021 for evaluation of a headache and blurred vision along his left eye. He left AMA prior to Neurology evaluation. He presented back to the ED on 02/06/2021 for persistent blurry vision along his left eye and a worsening headache. A Brain MRI was performed and consistent with optic neuritis. He has been started on steroids with 1g of Solu-Medrol for 5 days.  This morning, he reported tachycardia and an EKG was obtained which was most consistent with SVT with heart rate of 161 bpm at 1033. Repeat tracing showed that he converted back to sinus tachycardia which was obtained at 1040 and most recent EKG at 1056 shows normal sinus rhythm with heart rate of 95. IV Cardizem was ordered but not administered. Labs this AM show WBC 12.9, Hgb 14.8, platelets 294, Na+ 134, K+ 3.7 and creatinine 0.58. Mg 1.7 and supplementation has been ordered. BP was elevated this AM and he was started on Amlodipine 75m daily by the admitting team.   In talking the patient today, he reports he had just finished taking a shower and was in bed when he developed palpitations and called for the nurse. He reports associated dyspnea at that time but no nausea, vomiting or diaphoresis. He is unaware of any prior episodes of SVT denies any history of palpitations. Does report a family history of CHF but is unaware of any family history of cardiac arrhythmias. States he does consume a Red Bull drink on  a daily basis but did not consume this morning. Reports occasional alcohol use but no binge drinking.  Past Medical History:  Diagnosis Date   Bipolar 1 disorder ([place]    Depression    Sleep disorder     Past Surgical History:  Procedure Laterality Date   CHOLECYSTECTOMY N/A 03/18/2015   Procedure: LAPAROSCOPIC CHOLECYSTECTOMY;  Surgeon: MAviva Signs MD;  Location: [facility];  Service: General;  Laterality: N/A;   NO PAST SURGERIES       Home Medications:  Prior to Admission medications   Medication Sig Start Date End Date Taking? Authorizing Provider  QUEtiapine (SEROQUEL) 100 MG tablet Take 100 mg by mouth at bedtime.   Yes [provider]  HYDROcodone-acetaminophen (NORCO/VICODIN) 5-325 MG tablet Take 2 tablets by mouth every 4 (four) hours as needed. Patient not taking: No sig reported 07/12/18   Triplett, Tammy, PA-C  HYDROcodone-acetaminophen (NORCO/VICODIN) 5-325 MG tablet Take 1 tablet by mouth every 4 (four) hours as needed. Patient not taking: No sig reported 07/12/18   Triplett, Tammy, PA-C  ibuprofen (ADVIL,MOTRIN) 800 MG tablet Take 1 tablet (800 mg total) by mouth 3 (three) times daily. Patient not taking: No sig reported 07/12/18   Triplett, Tammy, PA-C  naproxen (NAPROSYN) 375 MG tablet Take 1 tablet (375 mg total) by mouth 2 (two) times daily. Patient not taking: No sig reported 08/21/18   SEtta Quill NP  penicillin v potassium (VEETID) 500 MG tablet Take 1 tablet (  500 mg total) by mouth 3 (three) times daily. Patient not taking: No sig reported 07/12/18   Kem Parkinson, PA-C    Inpatient Medications: Scheduled Meds:  amLODipine  5 mg Oral Daily   diltiazem       diltiazem  5 mg Intravenous Once   enoxaparin (LOVENOX) injection  60 mg Subcutaneous Q24H   metoprolol succinate  25 mg Oral Daily   nicotine  21 mg Transdermal Daily   pantoprazole  40 mg Oral BID   Continuous Infusions:  magnesium sulfate bolus IVPB 2 g (02/07/21 1109)   methylPREDNISolone  (SOLU-MEDROL) injection     PRN Meds: acetaminophen, hydrALAZINE, HYDROcodone-acetaminophen  Allergies:   No Known Allergies  Social History:   Social History   Socioeconomic History   Marital status: Single    Spouse name: Not on file   Number of children: Not on file   Years of education: Not on file   Highest education level: Not on file  Occupational History   Not on file  Tobacco Use   Smoking status: Every Day    Packs/day: 1.00    Years: 10.00    Pack years: 10.00    Types: Cigarettes   Smokeless tobacco: Never  Vaping Use   Vaping Use: Former  Substance and Sexual Activity   Alcohol use: Yes    Comment: Occassionally    Drug use: No   Sexual activity: Yes    Birth control/protection: None  Other Topics Concern   Not on file  Social History Narrative   Not on file   Social Determinants of Health   Financial Resource Strain: Not on file  Food Insecurity: Not on file  Transportation Needs: Not on file  Physical Activity: Not on file  Stress: Not on file  Social Connections: Not on file  Intimate Partner Violence: Not on file    Family History:   Family History  Problem Relation Age of Onset   Heart failure Maternal Grandmother      ROS:  Please see the history of present illness.   All other ROS reviewed and negative.     Physical Exam/Data:   Vitals:   02/06/21 2222 02/06/21 2224 02/07/21 0320 02/07/21 0543  BP: (!) 172/109  (!) 161/106 (!) 156/104  Pulse: 64  78 76  Resp: _0 Temp: 98.6 F (37 C)  98.5 F (36.9 C) 98.6 F (37 C)  TempSrc: Oral  Oral Oral  SpO2: 96% 96% 96% 96%  Weight:      Height:        Intake/Output Summary (Last 24 hours) at 02/07/2021 1157 Last data filed at 02/07/2021 0900 Gross per 24 hour  Intake 530 ml  Output --  Net 530 ml   Last 3 Weights 02/06/2021 02/05/2021 08/21/2018  Weight (lbs) 285 lb 285 lb 285 lb  Weight (kg) 129.275 kg 129.275 kg 129.275 kg     Body mass index is 36.59 kg/m.   General:  Well nourished, well developed male appearing in no acute distress. HEENT: normal Lymph: no adenopathy Neck: no JVD Endocrine:  No thryomegaly Vascular: No carotid bruits; FA pulses 2+ bilaterally without bruits  Cardiac:  normal S1, S2; RRR; no murmur.  Lungs:  clear to auscultation bilaterally, no wheezing, rhonchi or rales  Abd: soft, nontender, no hepatomegaly  Ext: no pitting edema Musculoskeletal:  No deformities, BUE and BLE strength normal and equal Skin: warm and dry  Neuro:  CNs 2-12 intact, no focal  abnormalities noted Psych:  Normal affect   EKG:  The EKG was personally reviewed and demonstrates: SVT with HR of 161 bpm at 1033  Telemetry: Not currently connected to telemetry.   Relevant CV Studies:  Echocardiogram: Pending  Laboratory Data:  High Sensitivity Troponin:  No results for input(s): TROPONINIHS in the last 720 hours.   Chemistry Recent Labs  Lab 02/05/21 2318 02/07/21 0541 02/07/21 1108  NA 135 134* 135  K 3.8 3.7 3.5  CL 103 100 101  CO2 26 24 21*  GLUCOSE 105* 164* 256*  BUN _0 CREATININE 0.62 0.58* 0.68  CALCIUM 8.8* 9.5 9.6  GFRNONAA >60 >60 >60  ANIONGAP _1 Recent Labs  Lab 02/05/21 2318 02/07/21 0541  PROT 7.2 8.0  ALBUMIN 3.8 4.2  AST 17 19  ALT 17 19  ALKPHOS 70 77  BILITOT 0.4 0.7   Hematology Recent Labs  Lab 02/05/21 2318 02/07/21 0541  WBC 12.5* 12.9*  RBC 5.07 5.58  HGB 13.8 14.8  HCT 43.0 45.3  MCV 84.8 81.2  MCH 27.2 26.5  MCHC 32.1 32.7  RDW 13.4 12.8  PLT 261 294   BNPNo results for input(s): BNP, PROBNP in the last 168 hours.  DDimer No results for input(s): DDIMER in the last 168 hours.   Radiology/Studies:  CT HEAD WO CONTRAST (5MM)  Result Date: 02/05/2021 CLINICAL DATA:  Migraine headache and blurry vision in the left eye. EXAM: CT HEAD WITHOUT CONTRAST TECHNIQUE: Contiguous axial images were obtained from the base of the skull through the vertex without intravenous  contrast. COMPARISON:  None. FINDINGS: Brain: No evidence of acute infarction, hemorrhage, hydrocephalus, extra-axial collection or mass lesion/mass effect. Vascular: No hyperdense vessel or unexpected calcification. Skull: Normal. Negative for fracture or focal lesion. Sinuses/Orbits: No acute finding. Other: None. IMPRESSION: No acute intracranial pathology. Electronically Signed   By: Virgina Norfolk M.D.   On: 02/05/2021 20:25   MR Brain W and Wo Contrast  Result Date: 02/06/2021 CLINICAL DATA:  Monocular vision loss, left.  Headache. EXAM: MRI HEAD AND ORBITS WITHOUT AND WITH CONTRAST TECHNIQUE: Multiplanar, multiecho pulse sequences of the brain and surrounding structures were obtained without and with intravenous contrast. Multiplanar, multiecho pulse sequences of the orbits and surrounding structures were obtained including fat saturation techniques, before and after intravenous contrast administration. CONTRAST:  43m GADAVIST GADOBUTROL 1 MMOL/ML IV SOLN COMPARISON:  None. FINDINGS: MRI HEAD FINDINGS Brain: No acute infarct, mass effect or extra-axial collection. No acute or chronic hemorrhage. Normal white matter signal, parenchymal volume and CSF spaces. The midline structures are normal. The postcontrast images are severely motion degraded. Vascular: Major flow voids are preserved. Skull and upper cervical spine: Normal calvarium and skull base. Visualized upper cervical spine and soft tissues are normal. MRI ORBITS FINDINGS Orbits: There is abnormal contrast enhancement and enlargement of the left optic nerve. Extraocular muscles are normal. Both lobes are normal. The right optic nerve is normal. Visualized sinuses: Clear Soft tissues: Normal IMPRESSION: 1. Abnormal contrast enhancement and enlargement of the left optic nerve, consistent with optic neuritis. 2. Normal MRI of the brain. Electronically Signed   By: KUlyses JarredM.D.   On: 02/06/2021 19:18   MR ORBITS W WO CONTRAST  Result  Date: 02/06/2021 CLINICAL DATA:  Monocular vision loss, left.  Headache. EXAM: MRI HEAD AND ORBITS WITHOUT AND WITH CONTRAST TECHNIQUE: Multiplanar, multiecho pulse sequences of the brain and surrounding structures were obtained without and with intravenous  contrast. Multiplanar, multiecho pulse sequences of the orbits and surrounding structures were obtained including fat saturation techniques, before and after intravenous contrast administration. CONTRAST:  100m GADAVIST GADOBUTROL 1 MMOL/ML IV SOLN COMPARISON:  None. FINDINGS: MRI HEAD FINDINGS Brain: No acute infarct, mass effect or extra-axial collection. No acute or chronic hemorrhage. Normal white matter signal, parenchymal volume and CSF spaces. The midline structures are normal. The postcontrast images are severely motion degraded. Vascular: Major flow voids are preserved. Skull and upper cervical spine: Normal calvarium and skull base. Visualized upper cervical spine and soft tissues are normal. MRI ORBITS FINDINGS Orbits: There is abnormal contrast enhancement and enlargement of the left optic nerve. Extraocular muscles are normal. Both lobes are normal. The right optic nerve is normal. Visualized sinuses: Clear Soft tissues: Normal IMPRESSION: 1. Abnormal contrast enhancement and enlargement of the left optic nerve, consistent with optic neuritis. 2. Normal MRI of the brain. Electronically Signed   By: KUlyses JarredM.D.   On: 02/06/2021 19:18     Assessment and Plan:   1. SVT - This is a new diagnosis for the patient this admission during which she is currently admitted for optic neuritis and being treated with IV steroids which could have triggered his arrhythmia. He spontaneously converted back to normal sinus rhythm and is now on telemetry for further monitoring. - His magnesium was slightly low at 1.7 this morning and supplementation has already been ordered. Would keep K+ ~ 4.0 and Mg ~ 2.0. Will check TSH. Also obtain an echocardiogram to  assess for any structural abnormalities. - Reviewed with Dr. MDomenic Politeand will start Toprol-XL 25 mg daily for now. Can hopefully be a short course while receiving IV steroids unless he has recurrent arrhythmias.   2. Optic Neuritis - Being treated with IV steroids. He does report already experiencing improvement in his symptoms. Management per the admitting team.    For questions or updates, please contact [email or web address] consult [email or web address] for contact info under    Signed, SRozann Lesches MD  02/07/2021 11:57 AM   Attending note:  Patient seen and examined.  I reviewed his records and discussed the case with Ms. SDelano Metz I agree with her above findings.  Mr. CPoehlerwas noted to have an episode of SVT today which resolved without intervention.  Rhythm captured by bedside ECG, single dose of IV Cardizem ordered by primary team but was not given.  He was not on telemetry at the time.  History includes tobacco abuse and bipolar disorder, he is currently admitted to the hospital with optic neuritis and is on fairly high-dose steroids.  He does not describe any prior history of palpitations or chest pain, no unexplained syncope.  On examination he appears comfortable.  He is afebrile, blood pressure 156/104, for the 70s.  Lungs are clear.  Cardiac exam with RRR no gallop.  Pertinent lab work includes potassium 3.5, creatinine 0.68, magnesium 1.5, CRP 0.7, ESR 10 hemoglobin 14.8, platelets 294.  ECG in sinus rhythm shows normal intervals, no preexcitation pattern.  SVT episode was in the [id number] not entirely certain whether represents reentry, but suspect AVRT versus AVNRT.  Plan to start low-dose Toprol-XL and place on telemetry.  Check echocardiogram for baseline cardiac structural evaluation.  Rhythm could have been precipitated in the setting of high-dose steroids. He has not been symptomatic in the past (he drinks energy drinks almost daily and does not describe any palpitations  or chest pain).  Checks TSH but doubt thyroid disease.  We will follow.  Satira Sark, M.D., F.A.C.C.

## 2021-02-07 NOTE — Consult Note (Signed)
[facility] Ahnya Akre A. Gerilyn Pilgrim, MD     [email or web address]          Ian Gonzalez is an 35 y.o. male.   ASSESSMENT/PLAN:  ACUTE LEFT OPTIC NEURITIS: Etiology is unclear at this time. So far imaging of the brain does not support a diagnosis of multiple sclerosis. This raises the potential for other forms of demyelinating disorders and in particular neuromyelitis optica spectrum disorders. As a result, the patient will have additional imaging of the cervical and thoracic spine. Aquaporin 4- autoantibodies and myelin oligodendrocyte glycoprotein autoantibodies will also be obtained.  It is sufficient for the patient to have 3 days course of high-dose Solu-Medrol.  Five days is not required given his significant improvement and imaging so far. The patient should follow-up in the office in 6 weeks for results and treatment and possibly refer to an academic center if needed.      The patient presents with the acute onset of headaches a few days ago. The headache lasted for several hours to a day. The following day he noticed that vision on the left was blurry and this progressed relatively rapidly over few hours to complete loss of vision. He decided to seek medical attention. He has had no episodes of this in the past. He has had no issues with his vision either the left or the right eye. He does not report history of demyelinating disorders in his family or personally. There are no reports of numbness, tingling or other neurological symptoms the patient. The review systems otherwise negative. The patient was diagnosed as having optic neuritis in started on high-dose Solu-Medrol. He has actually noticed a dramatic improvement in his vision after the 2nd dose.   GENERAL:  This is a very pleasant obese male who is doing well at this time.  HEENT:  Large neck, no trauma noted and neck is supple.  ABDOMEN: soft  EXTREMITIES: No edema   BACK: Normal  SKIN: Normal by inspection.     MENTAL STATUS: Alert and oriented. Speech, language and cognition are generally intact. Judgment and insight normal.   CRANIAL NERVES: Pupils are equal, round and reactive to light and accomodation; extra ocular movements are full, there is no significant nystagmus; visual fields are full; upper and lower facial muscles are normal in strength and symmetric, there is no flattening of the nasolabial folds; tongue is midline; uvula is midline; shoulder elevation is normal.  MOTOR: Normal tone, bulk and strength; no pronator drift.  COORDINATION: Left finger to nose is normal, right finger to nose is normal, No rest tremor; no intention tremor; no postural tremor; no bradykinesia.  REFLEXES: Deep tendon reflexes are symmetrical and normal. Plantar reflexes are flexor on the left but equivocal/upgoing on the right.   SENSATION: Normal to light touch, temperature, and pain.            Blood pressure (!) 164/94, pulse 99, temperature 97.9 F (36.6 C), resp. rate 18, height 6\' 2"  (1.88 m), weight 129.3 kg, SpO2 95 %.  Past Medical History:  Diagnosis Date   Bipolar 1 disorder (HCC)    Depression    Sleep disorder     Past Surgical History:  Procedure Laterality Date   CHOLECYSTECTOMY N/A 03/18/2015   Procedure: LAPAROSCOPIC CHOLECYSTECTOMY;  Surgeon: 05/18/2015, MD;  Location: [facility];  Service: General;  Laterality: N/A;   NO PAST SURGERIES      Family History  Problem Relation Age of Onset   Heart failure Maternal Grandmother  Social History:  reports that he has been smoking cigarettes. He has a 10.00 pack-year smoking history. He has never used smokeless tobacco. He reports current alcohol use. He reports that he does not use drugs.  Allergies: No Known Allergies  Medications: Prior to Admission medications   Medication Sig Start Date End Date Taking? Authorizing Provider  QUEtiapine (SEROQUEL) 100 MG tablet Take 100 mg by mouth at bedtime.   Yes [provider]  HYDROcodone-acetaminophen (NORCO/VICODIN) 5-325 MG tablet Take 2 tablets by mouth every 4 (four) hours as needed. Patient not taking: No sig reported 07/12/18   Triplett, Tammy, PA-C  HYDROcodone-acetaminophen (NORCO/VICODIN) 5-325 MG tablet Take 1 tablet by mouth every 4 (four) hours as needed. Patient not taking: No sig reported 07/12/18   Triplett, Tammy, PA-C  ibuprofen (ADVIL,MOTRIN) 800 MG tablet Take 1 tablet (800 mg total) by mouth 3 (three) times daily. Patient not taking: No sig reported 07/12/18   Triplett, Tammy, PA-C  naproxen (NAPROSYN) 375 MG tablet Take 1 tablet (375 mg total) by mouth 2 (two) times daily. Patient not taking: No sig reported 08/21/18   Felicie Morn, NP  penicillin v potassium (VEETID) 500 MG tablet Take 1 tablet (500 mg total) by mouth 3 (three) times daily. Patient not taking: No sig reported 07/12/18   Triplett, Tammy, PA-C    Scheduled Meds:  amLODipine  5 mg Oral Daily   diltiazem       diltiazem  5 mg Intravenous Once   enoxaparin (LOVENOX) injection  60 mg Subcutaneous Q24H   metoprolol succinate  25 mg Oral Daily   nicotine  21 mg Transdermal Daily   pantoprazole  40 mg Oral BID   QUEtiapine  50 mg Oral QHS   Continuous Infusions:  methylPREDNISolone (SOLU-MEDROL) injection     PRN Meds:.acetaminophen, hydrALAZINE, HYDROcodone-acetaminophen     Results for orders placed or performed during the hospital encounter of 02/06/21 (from the past 48 hour(s))  Resp Panel by RT-PCR (Flu A&B, Covid) Nasopharyngeal Swab     Status: None   Collection Time: 02/06/21  7:45 PM   Specimen: Nasopharyngeal Swab; Nasopharyngeal(NP) swabs in vial transport medium  Result Value Ref Range   SARS Coronavirus 2 by RT PCR NEGATIVE NEGATIVE    Comment: (NOTE) SARS-CoV-2 target nucleic acids are NOT DETECTED.  The SARS-CoV-2 RNA is generally detectable in upper respiratory specimens during the acute phase of infection. The lowest concentration of  SARS-CoV-2 viral copies this assay can detect is 138 copies/mL. A negative result does not preclude SARS-Cov-2 infection and should not be used as the sole basis for treatment or other patient management decisions. A negative result may occur with  improper specimen collection/handling, submission of specimen other than nasopharyngeal swab, presence of viral mutation(s) within the areas targeted by this assay, and inadequate number of viral copies(<138 copies/mL). A negative result must be combined with clinical observations, patient history, and epidemiological information. The expected result is Negative.  Fact Sheet for Patients:  BloggerCourse.com  Fact Sheet for Healthcare Providers:  SeriousBroker.it  This test is no t yet approved or cleared by the [place] FDA and  has been authorized for detection and/or diagnosis of SARS-CoV-2 by FDA under an Emergency Use Authorization (EUA). This EUA will remain  in effect (meaning this test can be used) for the duration of the COVID-19 declaration under Section 564(b)(1) of the Act, 21 U.S.C.section 360bbb-3(b)(1), unless the authorization is terminated  or revoked sooner.  Influenza A by PCR NEGATIVE NEGATIVE   Influenza B by PCR NEGATIVE NEGATIVE    Comment: (NOTE) The Xpert Xpress SARS-CoV-2/FLU/RSV plus assay is intended as an aid in the diagnosis of influenza from Nasopharyngeal swab specimens and should not be used as a sole basis for treatment. Nasal washings and aspirates are unacceptable for Xpert Xpress SARS-CoV-2/FLU/RSV testing.  Fact Sheet for Patients: BloggerCourse.comhttps://www.fda.gov/media/152166/download  Fact Sheet for Healthcare Providers: SeriousBroker.ithttps://www.fda.gov/media/152162/download  This test is not yet approved or cleared by the [place] FDA and has been authorized for detection and/or diagnosis of SARS-CoV-2 by FDA under an Emergency Use Authorization (EUA).  This EUA will remain in effect (meaning this test can be used) for the duration of the COVID-19 declaration under Section 564(b)(1) of the Act, 21 U.S.C. section 360bbb-3(b)(1), unless the authorization is terminated or revoked.  Performed at [facility], [address] [place], [place] [address]   HIV Antibody (routine testing w rflx)     Status: None   Collection Time: 02/07/21  5:41 AM  Result Value Ref Range   HIV Screen 4th Generation wRfx Non Reactive Non Reactive    Comment: Performed at [facility], [address] [place], [place] [address]  Comprehensive metabolic panel     Status: Abnormal   Collection Time: 02/07/21  5:41 AM  Result Value Ref Range   Sodium 134 (L) 135 - 145 mmol/L   Potassium 3.7 3.5 - 5.1 mmol/L   Chloride 100 98 - 111 mmol/L   CO2 24 22 - 32 mmol/L   Glucose, Bld 164 (H) 70 - 99 mg/dL    Comment: Glucose reference range applies only to samples taken after fasting for at least 8 hours.   BUN 10 6 - 20 mg/dL   Creatinine, Ser 6.960.58 (L) 0.61 - 1.24 mg/dL   Calcium 9.5 8.9 - 29.510.3 mg/dL   Total Protein 8.0 6.5 - 8.1 g/dL   Albumin 4.2 3.5 - 5.0 g/dL   AST 19 15 - 41 U/L   ALT 19 0 - 44 U/L   Alkaline Phosphatase 77 38 - 126 U/L   Total Bilirubin 0.7 0.3 - 1.2 mg/dL   GFR, Estimated >28>60 >41>60 mL/min    Comment: (NOTE) Calculated using the CKD-EPI Creatinine Equation (2021)    Anion gap 10 5 - 15    Comment: Performed at [facility], [address] [place], [place] [address]  CBC     Status: Abnormal   Collection Time: 02/07/21  5:41 AM  Result Value Ref Range   WBC 12.9 (H) 4.0 - 10.5 K/uL   RBC 5.58 4.22 - 5.81 MIL/uL   Hemoglobin 14.8 13.0 - 17.0 g/dL   HCT 10.245.3 72.539.0 - 36.652.0 %   MCV 81.2 80.0 - 100.0 fL   MCH 26.5 26.0 - 34.0 pg   MCHC 32.7 30.0 - 36.0 g/dL   RDW 44.012.8 34.711.5 - 42.515.5 %   Platelets 294 150 - 400 K/uL   nRBC 0.0 0.0 - 0.2 %    Comment: Performed at [facility], [address] [place], [place] [address]  Protime-INR      Status: None   Collection Time: 02/07/21  5:41 AM  Result Value Ref Range   Prothrombin Time 13.3 11.4 - 15.2 seconds   INR 1.0 0.8 - 1.2    Comment: (NOTE) INR goal varies based on device and disease states. Performed at [facility], [address] [place], [place] [address]   APTT  Status: None   Collection Time: 02/07/21  5:41 AM  Result Value Ref Range   aPTT 26 24 - 36 seconds    Comment: Performed at [facility], [address] [place] [address], [place], [place] [address]  Magnesium     Status: None   Collection Time: 02/07/21  5:41 AM  Result Value Ref Range   Magnesium 1.7 1.7 - 2.4 mg/dL    Comment: Performed at [facility], [address] [place], [place] [address]  Phosphorus     Status: None   Collection Time: 02/07/21  5:41 AM  Result Value Ref Range   Phosphorus 3.4 2.5 - 4.6 mg/dL    Comment: Performed at [facility], [address] [place], [place] [address]  Magnesium     Status: Abnormal   Collection Time: 02/07/21 11:08 AM  Result Value Ref Range   Magnesium 1.5 (L) 1.7 - 2.4 mg/dL    Comment: Performed at [facility], [address], [place], [place] [address]  Basic metabolic panel     Status: Abnormal   Collection Time: 02/07/21 11:08 AM  Result Value Ref Range   Sodium 135 135 - 145 mmol/L   Potassium 3.5 3.5 - 5.1 mmol/L   Chloride 101 98 - 111 mmol/L   CO2 21 (L) 22 - 32 mmol/L   Glucose, Bld 256 (H) 70 - 99 mg/dL    Comment: Glucose reference range applies only to samples taken after fasting for at least 8 hours.   BUN 13 6 - 20 mg/dL   Creatinine, Ser 8.29 0.61 - 1.24 mg/dL   Calcium 9.6 8.9 - 93.7 mg/dL   GFR, Estimated >16 >96 mL/min    Comment: (NOTE) Calculated using the CKD-EPI Creatinine Equation (2021)    Anion gap 13 5 - 15    Comment: Performed at [facility], [address], [place], [place] [address]    Studies/Results:   MRI HEAD AND ORBITS WITHOUT AND WITH CONTRAST  FINDINGS: MRI HEAD FINDINGS   Brain: No acute infarct, mass effect  or extra-axial collection. No acute or chronic hemorrhage. Normal white matter signal, parenchymal volume and CSF spaces. The midline structures are normal. The postcontrast images are severely motion degraded.   Vascular: Major flow voids are preserved.   Skull and upper cervical spine: Normal calvarium and skull base. Visualized upper cervical spine and soft tissues are normal.   MRI ORBITS FINDINGS   Orbits: There is abnormal contrast enhancement and enlargement of the left optic nerve. Extraocular muscles are normal. Both lobes are normal. The right optic nerve is normal.   Visualized sinuses: Clear   Soft tissues: Normal   IMPRESSION: 1. Abnormal contrast enhancement and enlargement of the left optic nerve, consistent with optic neuritis. 2. Normal MRI of the brain.       Brain MRI is reviewed in person and shows no abnormalities involving the brain. No white matter lesions are noted. No encephalomalacia. There is increased signal semi homogeneously involving left optic nerve on DWI. There is seems to involve the entire optic nerve and is associated with faint diffuse enhancement.   Elivia Robotham A. Gerilyn Pilgrim, M.D.  Diplomate, Biomedical engineer of Psychiatry and Neurology ( Neurology). 02/07/2021, 5:22 PM

## 2021-02-07 NOTE — Progress Notes (Signed)
Nurse Deanna notified this nurse that pt was c/o chest pain and rapid heart beat. Nurse Deanna completed EKG which showed pt was in SVT with an HR of 161. Nurse Deanna notified MD immediately of results and received new orders to push 5mg /57ml  of Cardizem. Second EKG was done and showed sinus tachy HR of 91. Charge nurse and rapid team present, third EKG completed at which showed HR of 90. Pt converted to normal rhythm on his own and was sustaining HR in 90's. . MD notified by this nurse and gave the ok to hold Cardizem at this time. New orders to place pt on cardiac monitoring.

## 2021-02-07 NOTE — Progress Notes (Signed)
This RN went into pt's room to do rounding. Pt was found sitting up on side of bed. Pt stated that he has "restless legs" and started tapping his foot on the floor.  This RN informed pt that MD has already stated that he would not prescribe any more medication until he had someone to bring in pt's home medications. Pt stated understanding. This RN also noticed that pt's room smelled like cigarettes. This RN asked pt if he has been smoking in his room. Pt stated "No. I haven't even thought about smoking since you put this patch on me". This RN then reminded pt that all rooms are non-smoking, and pt is not able to leave the floor. Pt stated understanding.

## 2021-02-07 NOTE — Progress Notes (Signed)
PROGRESS NOTE    Ian Gonzalez  OIZ:[id number] DOB: [birth year] DOA: 02/06/2021 PCP: Patient, No Pcp Per (Inactive)   Brief Narrative: 35 year old with past medical history significant for obesity, tobacco use who presents to the emergency department complaining of left side headache and 2 days of left-sided blurry vision.  MRI Orbit was performed and showed finding of optic neuritis.  Case was discussed with Dr. Amada Jupiter who recommended IV Solu-Medrol 1 g for 5 days.     Assessment & Plan:   Principal Problem:   Optic neuritis Active Problems:   Leukocytosis   Obesity (BMI 30-39.9)   Tobacco use  1-Left side Optic Neuritis: MRI was consistent with optic neuritis Case discussed with Dr. Amada Jupiter at [facility], who recommended continuation of high-dose Solu-Medrol 1 g for 5 days.  GI prophylaxis for at least 7 days after completion of IV steroids.  He recommended inpatient neurology consultation with Dr. Gerilyn Pilgrim.  Discussed with Dr. Amada Jupiter,  patient should  follow-up as an outpatient with ophthalmology.  -Continue with IV 1 gr IV for 5 days. (Patient would like to complete IV Steroids out patient if possible, Follow up Dr Gerilyn Pilgrim recommendations.  -  2-Leukocytosis: Reactive.  Now on IV steroids.   3-SVT: Develops palpitation, EKG done showed SVT. IV Cardizem ordered but not given HR down to sinus 90./ IV magnesium ordered. 40 meq KCL.  -Cardiology consulted, appreciate assistance.  -Started on Metoprolol.  -TSH ordered. ECHO ordered.  -Per cardiology , SVT could be related to IV steroids.   4-Obesity type II; Needs life style modifications.   Tobacco use: Nicotine patch ordered     Estimated body mass index is 36.59 kg/m as calculated from the following:   Height as of this encounter: 6\' 2"  (1.88 m).   Weight as of this encounter: 129.3 kg.   DVT prophylaxis: Lovenox Code Status: Full code Family Communication: Care discussed with patient Disposition  Plan:  Status is: Inpatient  Remains inpatient appropriate because:IV treatments appropriate due to intensity of illness or inability to take PO  Dispo: The patient is from: Home              Anticipated d/c is to: Home              Patient currently is not medically stable to d/c.   Difficult to place patient No        Consultants:  Cardiology Neurology   Procedures:  None  Antimicrobials:    Subjective: Seen this morning he reported improvement of the left side pleural effusion.  He is complaining of left thigh pain.  Has been asking for pain medication.  Objective: Vitals:   02/06/21 2222 02/06/21 2224 02/07/21 0320 02/07/21 0543  BP: (!) 172/109  (!) 161/106 (!) 156/104  Pulse: 64  78 76  Resp: 18  17 18   Temp: 98.6 F (37 C)  98.5 F (36.9 C) 98.6 F (37 C)  TempSrc: Oral  Oral Oral  SpO2: 96% 96% 96% 96%  Weight:      Height:        Intake/Output Summary (Last 24 hours) at 02/07/2021 0817 Last data filed at 02/07/2021 0500 Gross per 24 hour  Intake 290 ml  Output --  Net 290 ml   Filed Weights   02/06/21 1415  Weight: 129.3 kg    Examination:  General exam: Appears calm and comfortable  Respiratory system: Clear to auscultation. Respiratory effort normal. Cardiovascular system: S1 & S2 heard, RRR. No  JVD, murmurs, rubs, gallops or clicks. No pedal edema. Gastrointestinal system: Abdomen is nondistended, soft and nontender. No organomegaly or masses felt. Normal bowel sounds heard. Central nervous system: Alert and oriented. No focal neurological deficits. Report blurry vision left eye.  Extremities: Symmetric 5 x 5 power. Skin: No rashes, lesions or ulcers Psychiatry: Judgement and insight appear normal. Mood & affect appropriate.     Data Reviewed: I have personally reviewed following labs and imaging studies  CBC: Recent Labs  Lab 02/05/21 2318 02/07/21 0541  WBC 12.5* 12.9*  NEUTROABS 8.9*  --   HGB 13.8 14.8  HCT 43.0 45.3  MCV  84.8 81.2  PLT 261 294   Basic Metabolic Panel: Recent Labs  Lab 02/05/21 2318 02/07/21 0541  NA 135 134*  K 3.8 3.7  CL 103 100  CO2 26 24  GLUCOSE 105* 164*  BUN 9 10  CREATININE 0.62 0.58*  CALCIUM 8.8* 9.5  MG  --  1.7  PHOS  --  3.4   GFR: Estimated Creatinine Clearance: 187.6 mL/min (A) (by C-G formula based on SCr of 0.58 mg/dL (L)). Liver Function Tests: Recent Labs  Lab 02/05/21 2318 02/07/21 0541  AST 17 19  ALT 17 19  ALKPHOS 70 77  BILITOT 0.4 0.7  PROT 7.2 8.0  ALBUMIN 3.8 4.2   No results for input(s): LIPASE, AMYLASE in the last 168 hours. No results for input(s): AMMONIA in the last 168 hours. Coagulation Profile: Recent Labs  Lab 02/07/21 0541  INR 1.0   Cardiac Enzymes: No results for input(s): CKTOTAL, CKMB, CKMBINDEX, TROPONINI in the last 168 hours. BNP (last 3 results) No results for input(s): PROBNP in the last 8760 hours. HbA1C: No results for input(s): HGBA1C in the last 72 hours. CBG: No results for input(s): GLUCAP in the last 168 hours. Lipid Profile: No results for input(s): CHOL, HDL, LDLCALC, TRIG, CHOLHDL, LDLDIRECT in the last 72 hours. Thyroid Function Tests: No results for input(s): TSH, T4TOTAL, FREET4, T3FREE, THYROIDAB in the last 72 hours. Anemia Panel: No results for input(s): VITAMINB12, FOLATE, FERRITIN, TIBC, IRON, RETICCTPCT in the last 72 hours. Sepsis Labs: No results for input(s): PROCALCITON, LATICACIDVEN in the last 168 hours.  Recent Results (from the past 240 hour(s))  Resp Panel by RT-PCR (Flu A&B, Covid) Nasopharyngeal Swab     Status: None   Collection Time: 02/06/21  7:45 PM   Specimen: Nasopharyngeal Swab; Nasopharyngeal(NP) swabs in vial transport medium  Result Value Ref Range Status   SARS Coronavirus 2 by RT PCR NEGATIVE NEGATIVE Final    Comment: (NOTE) SARS-CoV-2 target nucleic acids are NOT DETECTED.  The SARS-CoV-2 RNA is generally detectable in upper respiratory specimens during the  acute phase of infection. The lowest concentration of SARS-CoV-2 viral copies this assay can detect is 138 copies/mL. A negative result does not preclude SARS-Cov-2 infection and should not be used as the sole basis for treatment or other patient management decisions. A negative result may occur with  improper specimen collection/handling, submission of specimen other than nasopharyngeal swab, presence of viral mutation(s) within the areas targeted by this assay, and inadequate number of viral copies(<138 copies/mL). A negative result must be combined with clinical observations, patient history, and epidemiological information. The expected result is Negative.  Fact Sheet for Patients:  BloggerCourse.com  Fact Sheet for Healthcare Providers:  SeriousBroker.it  This test is no t yet approved or cleared by the [place] FDA and  has been authorized for detection and/or diagnosis of  SARS-CoV-2 by FDA under an Emergency Use Authorization (EUA). This EUA will remain  in effect (meaning this test can be used) for the duration of the COVID-19 declaration under Section 564(b)(1) of the Act, 21 U.S.C.section 360bbb-3(b)(1), unless the authorization is terminated  or revoked sooner.       Influenza A by PCR NEGATIVE NEGATIVE Final   Influenza B by PCR NEGATIVE NEGATIVE Final    Comment: (NOTE) The Xpert Xpress SARS-CoV-2/FLU/RSV plus assay is intended as an aid in the diagnosis of influenza from Nasopharyngeal swab specimens and should not be used as a sole basis for treatment. Nasal washings and aspirates are unacceptable for Xpert Xpress SARS-CoV-2/FLU/RSV testing.  Fact Sheet for Patients: BloggerCourse.comhttps://www.fda.gov/media/152166/download  Fact Sheet for Healthcare Providers: SeriousBroker.ithttps://www.fda.gov/media/152162/download  This test is not yet approved or cleared by the [place] FDA and has been authorized for detection and/or  diagnosis of SARS-CoV-2 by FDA under an Emergency Use Authorization (EUA). This EUA will remain in effect (meaning this test can be used) for the duration of the COVID-19 declaration under Section 564(b)(1) of the Act, 21 U.S.C. section 360bbb-3(b)(1), unless the authorization is terminated or revoked.  Performed at [facility], [address] [place], [place] [id number]          Radiology Studies: CT HEAD WO CONTRAST (5MM)  Result Date: 02/05/2021 CLINICAL DATA:  Migraine headache and blurry vision in the left eye. EXAM: CT HEAD WITHOUT CONTRAST TECHNIQUE: Contiguous axial images were obtained from the base of the skull through the vertex without intravenous contrast. COMPARISON:  None. FINDINGS: Brain: No evidence of acute infarction, hemorrhage, hydrocephalus, extra-axial collection or mass lesion/mass effect. Vascular: No hyperdense vessel or unexpected calcification. Skull: Normal. Negative for fracture or focal lesion. Sinuses/Orbits: No acute finding. Other: None. IMPRESSION: No acute intracranial pathology. Electronically Signed   By: Aram Candelahaddeus  Houston M.D.   On: 02/05/2021 20:25   MR Brain W and Wo Contrast  Result Date: 02/06/2021 CLINICAL DATA:  Monocular vision loss, left.  Headache. EXAM: MRI HEAD AND ORBITS WITHOUT AND WITH CONTRAST TECHNIQUE: Multiplanar, multiecho pulse sequences of the brain and surrounding structures were obtained without and with intravenous contrast. Multiplanar, multiecho pulse sequences of the orbits and surrounding structures were obtained including fat saturation techniques, before and after intravenous contrast administration. CONTRAST:  10mL GADAVIST GADOBUTROL 1 MMOL/ML IV SOLN COMPARISON:  None. FINDINGS: MRI HEAD FINDINGS Brain: No acute infarct, mass effect or extra-axial collection. No acute or chronic hemorrhage. Normal white matter signal, parenchymal volume and CSF spaces. The midline structures are normal. The postcontrast images are severely  motion degraded. Vascular: Major flow voids are preserved. Skull and upper cervical spine: Normal calvarium and skull base. Visualized upper cervical spine and soft tissues are normal. MRI ORBITS FINDINGS Orbits: There is abnormal contrast enhancement and enlargement of the left optic nerve. Extraocular muscles are normal. Both lobes are normal. The right optic nerve is normal. Visualized sinuses: Clear Soft tissues: Normal IMPRESSION: 1. Abnormal contrast enhancement and enlargement of the left optic nerve, consistent with optic neuritis. 2. Normal MRI of the brain. Electronically Signed   By: Deatra RobinsonKevin  Herman M.D.   On: 02/06/2021 19:18   MR ORBITS W WO CONTRAST  Result Date: 02/06/2021 CLINICAL DATA:  Monocular vision loss, left.  Headache. EXAM: MRI HEAD AND ORBITS WITHOUT AND WITH CONTRAST TECHNIQUE: Multiplanar, multiecho pulse sequences of the brain and surrounding structures were obtained without and with intravenous contrast. Multiplanar, multiecho pulse sequences of the orbits and surrounding structures were  obtained including fat saturation techniques, before and after intravenous contrast administration. CONTRAST:  86mL GADAVIST GADOBUTROL 1 MMOL/ML IV SOLN COMPARISON:  None. FINDINGS: MRI HEAD FINDINGS Brain: No acute infarct, mass effect or extra-axial collection. No acute or chronic hemorrhage. Normal white matter signal, parenchymal volume and CSF spaces. The midline structures are normal. The postcontrast images are severely motion degraded. Vascular: Major flow voids are preserved. Skull and upper cervical spine: Normal calvarium and skull base. Visualized upper cervical spine and soft tissues are normal. MRI ORBITS FINDINGS Orbits: There is abnormal contrast enhancement and enlargement of the left optic nerve. Extraocular muscles are normal. Both lobes are normal. The right optic nerve is normal. Visualized sinuses: Clear Soft tissues: Normal IMPRESSION: 1. Abnormal contrast enhancement and  enlargement of the left optic nerve, consistent with optic neuritis. 2. Normal MRI of the brain. Electronically Signed   By: Deatra Robinson M.D.   On: 02/06/2021 19:18        Scheduled Meds:  amLODipine  5 mg Oral Daily   enoxaparin (LOVENOX) injection  60 mg Subcutaneous Q24H   nicotine  21 mg Transdermal Daily   Continuous Infusions:  methylPREDNISolone (SOLU-MEDROL) injection       LOS: 1 day    Time spent: 35 minutes    Sanyia Dini A Taylynn Easton, MD [facility]   If 7PM-7AM, please contact night-coverage [email or web address]  02/07/2021, 8:17 AM

## 2021-02-07 NOTE — Progress Notes (Signed)
**Note De-identified  Obfuscation** EKG complete and placed in patient chart 

## 2021-02-08 ENCOUNTER — Inpatient Hospital Stay (HOSPITAL_COMMUNITY): Payer: BC Managed Care – PPO

## 2021-02-08 DIAGNOSIS — I471 Supraventricular tachycardia, unspecified: Secondary | ICD-10-CM | POA: Clinically undetermined

## 2021-02-08 LAB — CBC
HCT: 43.6 % (ref 39.0–52.0)
Hemoglobin: 14.2 g/dL (ref 13.0–17.0)
MCH: 27 pg (ref 26.0–34.0)
MCHC: 32.6 g/dL (ref 30.0–36.0)
MCV: 82.9 fL (ref 80.0–100.0)
Platelets: 291 10*3/uL (ref 150–400)
RBC: 5.26 MIL/uL (ref 4.22–5.81)
RDW: 13.4 % (ref 11.5–15.5)
WBC: 14.6 10*3/uL — ABNORMAL HIGH (ref 4.0–10.5)
nRBC: 0 % (ref 0.0–0.2)

## 2021-02-08 LAB — VITAMIN B12: Vitamin B-12: 349 pg/mL (ref 180–914)

## 2021-02-08 LAB — T4, FREE: Free T4: 0.88 ng/dL (ref 0.61–1.12)

## 2021-02-08 LAB — BASIC METABOLIC PANEL
Anion gap: 11 (ref 5–15)
BUN: 14 mg/dL (ref 6–20)
CO2: 24 mmol/L (ref 22–32)
Calcium: 9.7 mg/dL (ref 8.9–10.3)
Chloride: 103 mmol/L (ref 98–111)
Creatinine, Ser: 0.55 mg/dL — ABNORMAL LOW (ref 0.61–1.24)
GFR, Estimated: >60 mL/min (ref >60)
Glucose, Bld: 145 mg/dL — ABNORMAL HIGH (ref 70–99)
Potassium: 4.2 mmol/L (ref 3.5–5.1)
Sodium: 138 mmol/L (ref 135–145)

## 2021-02-08 LAB — MAGNESIUM: Magnesium: 2.2 mg/dL (ref 1.7–2.4)

## 2021-02-08 LAB — TSH: TSH: 0.308 u[IU]/mL — ABNORMAL LOW (ref 0.350–4.500)

## 2021-02-08 MED ORDER — AMLODIPINE BESYLATE 5 MG PO TABS: 5.0000 mg | ORAL_TABLET | Freq: Every day | ORAL | 1 refills | Status: DC

## 2021-02-08 MED ORDER — METOPROLOL SUCCINATE ER 25 MG PO TB24: 25.0000 mg | ORAL_TABLET | Freq: Every day | ORAL | 1 refills | Status: DC

## 2021-02-08 MED ORDER — NICOTINE 21 MG/24HR TD PT24: 21.0000 mg | MEDICATED_PATCH | Freq: Every day | TRANSDERMAL | 0 refills | Status: DC

## 2021-02-08 MED ORDER — PANTOPRAZOLE SODIUM 40 MG PO TBEC
40.0000 mg | DELAYED_RELEASE_TABLET | Freq: Every day | ORAL | 1 refills | Status: DC
Start: 2021-02-08 — End: 2021-05-15

## 2021-02-08 MED ORDER — ALPRAZOLAM 0.5 MG PO TABS
0.5000 mg | ORAL_TABLET | ORAL | Status: AC
  Administered 2021-02-08: 0.5 mg via ORAL
  Filled 2021-02-08: qty 1

## 2021-02-08 NOTE — Progress Notes (Signed)
[facility] Ian Verne A. Gerilyn Pilgrim, MD     [email or web address]          Ian Gonzalez is an 35 y.o. male.   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  The patient should follow-up in the office in 6 weeks for results and treatment and possibly refer to an academic center if needed.   The patient remains unchanged. Imaging results are reviewed in person and the is negative for lesions involving the spinal cord.    GENERAL:  This is a very pleasant obese male who is doing well at this time.  HEENT:  Large neck, no trauma noted and neck is supple.  ABDOMEN: soft  EXTREMITIES: No edema   BACK: Normal  SKIN: Normal by inspection.    MENTAL STATUS: Alert and oriented. Speech, language and cognition are generally intact. Judgment and insight normal.   CRANIAL NERVES: Pupils are equal, round and reactive to light and accomodation; extra ocular movements are full, there is no significant nystagmus; visual fields are full; upper and lower facial muscles are normal in strength and symmetric, there is no flattening of the nasolabial folds; tongue is midline; uvula is midline; shoulder elevation is normal.  MOTOR: Normal tone, bulk and strength; no pronator drift.  COORDINATION: Left finger to nose is normal, right finger to nose is normal, No rest tremor; no intention tremor; no postural tremor; no bradykinesia.  REFLEXES: Deep tendon reflexes are symmetrical and normal. Plantar reflexes are flexor on the left but equivocal/upgoing on the right.   SENSATION: Normal to light touch, temperature, and pain.            Blood pressure 138/89, pulse 88, temperature 98.3 F (36.8 C), temperature source Oral, resp. rate 18, height 6\' 2"  (1.88 m), weight 129.3 kg, SpO2 98 %.  Past Medical History:  Diagnosis Date   Bipolar 1 disorder (HCC)    Depression    Sleep disorder      Past Surgical History:  Procedure Laterality Date   CHOLECYSTECTOMY N/A 03/18/2015   Procedure: LAPAROSCOPIC CHOLECYSTECTOMY;  Surgeon: 05/18/2015, MD;  Location: [facility];  Service: General;  Laterality: N/A;   NO PAST SURGERIES      Family History  Problem Relation Age of Onset   Heart failure Maternal Grandmother     Social History:  reports that he has been smoking cigarettes. He has a 10.00 pack-year smoking history. He has never used smokeless tobacco. He reports current alcohol use. He reports that he does not use drugs.  Allergies: No Known Allergies  Medications: Prior to Admission medications   Medication Sig Start Date End Date Taking? Authorizing Provider  QUEtiapine (SEROQUEL) 100 MG tablet Take 100 mg by mouth at bedtime.   Yes [provider]  HYDROcodone-acetaminophen (NORCO/VICODIN) 5-325 MG tablet Take 2 tablets by mouth every 4 (four) hours as needed. Patient not taking: No sig reported 07/12/18   Triplett, Tammy, PA-C  HYDROcodone-acetaminophen (NORCO/VICODIN) 5-325 MG tablet Take 1 tablet by mouth every 4 (four) hours as needed. Patient not taking: No sig reported 07/12/18   Triplett, Tammy, PA-C  ibuprofen (ADVIL,MOTRIN) 800 MG tablet Take 1 tablet (800 mg total) by mouth 3 (three) times daily. Patient not taking: No sig reported 07/12/18   Triplett, Tammy, PA-C  naproxen (NAPROSYN) 375 MG tablet Take 1 tablet (375 mg total) by mouth 2 (two) times daily. Patient not taking: No sig reported 08/21/18   10/21/18,  NP  penicillin v potassium (VEETID) 500 MG tablet Take 1 tablet (500 mg total) by mouth 3 (three) times daily. Patient not taking: No sig reported 07/12/18   Triplett, Tammy, PA-C    Scheduled Meds:  amLODipine  5 mg Oral Daily   diltiazem  5 mg Intravenous Once   enoxaparin (LOVENOX) injection  60 mg Subcutaneous Q24H   metoprolol succinate  25 mg Oral Daily   nicotine  21 mg Transdermal Daily   pantoprazole  40 mg Oral BID   QUEtiapine  50  mg Oral QHS   Continuous Infusions:  methylPREDNISolone (SOLU-MEDROL) injection 1,000 mg (02/08/21 1641)   PRN Meds:.acetaminophen, hydrALAZINE, HYDROcodone-acetaminophen     Results for orders placed or performed during the hospital encounter of 02/06/21 (from the past 48 hour(s))  Resp Panel by RT-PCR (Flu A&B, Covid) Nasopharyngeal Swab     Status: None   Collection Time: 02/06/21  7:45 PM   Specimen: Nasopharyngeal Swab; Nasopharyngeal(NP) swabs in vial transport medium  Result Value Ref Range   SARS Coronavirus 2 by RT PCR NEGATIVE NEGATIVE    Comment: (NOTE) SARS-CoV-2 target nucleic acids are NOT DETECTED.  The SARS-CoV-2 RNA is generally detectable in upper respiratory specimens during the acute phase of infection. The lowest concentration of SARS-CoV-2 viral copies this assay can detect is 138 copies/mL. A negative result does not preclude SARS-Cov-2 infection and should not be used as the sole basis for treatment or other patient management decisions. A negative result may occur with  improper specimen collection/handling, submission of specimen other than nasopharyngeal swab, presence of viral mutation(s) within the areas targeted by this assay, and inadequate number of viral copies(<138 copies/mL). A negative result must be combined with clinical observations, patient history, and epidemiological information. The expected result is Negative.  Fact Sheet for Patients:  BloggerCourse.comhttps://www.fda.gov/media/152166/download  Fact Sheet for Healthcare Providers:  SeriousBroker.ithttps://www.fda.gov/media/152162/download  This test is no t yet approved or cleared by the [place] FDA and  has been authorized for detection and/or diagnosis of SARS-CoV-2 by FDA under an Emergency Use Authorization (EUA). This EUA will remain  in effect (meaning this test can be used) for the duration of the COVID-19 declaration under Section 564(b)(1) of the Act, 21 U.S.C.section 360bbb-3(b)(1), unless the  authorization is terminated  or revoked sooner.       Influenza A by PCR NEGATIVE NEGATIVE   Influenza B by PCR NEGATIVE NEGATIVE    Comment: (NOTE) The Xpert Xpress SARS-CoV-2/FLU/RSV plus assay is intended as an aid in the diagnosis of influenza from Nasopharyngeal swab specimens and should not be used as a sole basis for treatment. Nasal washings and aspirates are unacceptable for Xpert Xpress SARS-CoV-2/FLU/RSV testing.  Fact Sheet for Patients: BloggerCourse.comhttps://www.fda.gov/media/152166/download  Fact Sheet for Healthcare Providers: SeriousBroker.ithttps://www.fda.gov/media/152162/download  This test is not yet approved or cleared by the [place] FDA and has been authorized for detection and/or diagnosis of SARS-CoV-2 by FDA under an Emergency Use Authorization (EUA). This EUA will remain in effect (meaning this test can be used) for the duration of the COVID-19 declaration under Section 564(b)(1) of the Act, 21 U.S.C. section 360bbb-3(b)(1), unless the authorization is terminated or revoked.  Performed at [facility], [address] [place], [place] [id number]   Vitamin B12     Status: None   Collection Time: 02/06/21 11:18 PM  Result Value Ref Range   Vitamin B-12 349 180 - 914 pg/mL    Comment: (NOTE) This assay is not validated for testing neonatal or  myeloproliferative syndrome specimens for Vitamin B12 levels. Performed at [facility], [address] [place], [place] [address]   HIV Antibody (routine testing w rflx)     Status: None   Collection Time: 02/07/21  5:41 AM  Result Value Ref Range   HIV Screen 4th Generation wRfx Non Reactive Non Reactive    Comment: Performed at [facility], [address], [place], [place] [address]  Comprehensive metabolic panel     Status: Abnormal   Collection Time: 02/07/21  5:41 AM  Result Value Ref Range   Sodium 134 (L) 135 - 145 mmol/L   Potassium 3.7 3.5 - 5.1 mmol/L   Chloride 100 98 - 111 mmol/L   CO2 24 22 - 32 mmol/L    Glucose, Bld 164 (H) 70 - 99 mg/dL    Comment: Glucose reference range applies only to samples taken after fasting for at least 8 hours.   BUN 10 6 - 20 mg/dL   Creatinine, Ser 4.49 (L) 0.61 - 1.24 mg/dL   Calcium 9.5 8.9 - 67.5 mg/dL   Total Protein 8.0 6.5 - 8.1 g/dL   Albumin 4.2 3.5 - 5.0 g/dL   AST 19 15 - 41 U/L   ALT 19 0 - 44 U/L   Alkaline Phosphatase 77 38 - 126 U/L   Total Bilirubin 0.7 0.3 - 1.2 mg/dL   GFR, Estimated >91 >63 mL/min    Comment: (NOTE) Calculated using the CKD-EPI Creatinine Equation (2021)    Anion gap 10 5 - 15    Comment: Performed at [facility], [address] [place], [place] [address]  CBC     Status: Abnormal   Collection Time: 02/07/21  5:41 AM  Result Value Ref Range   WBC 12.9 (H) 4.0 - 10.5 K/uL   RBC 5.58 4.22 - 5.81 MIL/uL   Hemoglobin 14.8 13.0 - 17.0 g/dL   HCT 99.3 57.0 - 17.7 %   MCV 81.2 80.0 - 100.0 fL   MCH 26.5 26.0 - 34.0 pg   MCHC 32.7 30.0 - 36.0 g/dL   RDW 93.9 03.0 - 09.2 %   Platelets 294 150 - 400 K/uL   nRBC 0.0 0.0 - 0.2 %    Comment: Performed at [facility], [address], [place], [place] [address]  Protime-INR     Status: None   Collection Time: 02/07/21  5:41 AM  Result Value Ref Range   Prothrombin Time 13.3 11.4 - 15.2 seconds   INR 1.0 0.8 - 1.2    Comment: (NOTE) INR goal varies based on device and disease states. Performed at [facility], [address], [place], [place] [address]   APTT     Status: None   Collection Time: 02/07/21  5:41 AM  Result Value Ref Range   aPTT 26 24 - 36 seconds    Comment: Performed at [facility], [address], [place], [place] [address]  Magnesium     Status: None   Collection Time: 02/07/21  5:41 AM  Result Value Ref Range   Magnesium 1.7 1.7 - 2.4 mg/dL    Comment: Performed at [facility], [address] [place], [place] [address]  Phosphorus     Status: None   Collection Time: 02/07/21  5:41 AM  Result Value Ref Range   Phosphorus 3.4 2.5 - 4.6 mg/dL     Comment: Performed at [facility], [address], [place], [place] [address]  Magnesium     Status: Abnormal  Collection Time: 02/07/21 11:08 AM  Result Value Ref Range   Magnesium 1.5 (L) 1.7 - 2.4 mg/dL    Comment: Performed at [facility], [address] [place], [place] [address]  Basic metabolic panel     Status: Abnormal   Collection Time: 02/07/21 11:08 AM  Result Value Ref Range   Sodium 135 135 - 145 mmol/L   Potassium 3.5 3.5 - 5.1 mmol/L   Chloride 101 98 - 111 mmol/L   CO2 21 (L) 22 - 32 mmol/L   Glucose, Bld 256 (H) 70 - 99 mg/dL    Comment: Glucose reference range applies only to samples taken after fasting for at least 8 hours.   BUN 13 6 - 20 mg/dL   Creatinine, Ser 6.04 0.61 - 1.24 mg/dL   Calcium 9.6 8.9 - 54.0 mg/dL   GFR, Estimated >98 >11 mL/min    Comment: (NOTE) Calculated using the CKD-EPI Creatinine Equation (2021)    Anion gap 13 5 - 15    Comment: Performed at [facility], [address], , [place] [address]  TSH     Status: Abnormal   Collection Time: 02/08/21  5:23 AM  Result Value Ref Range   TSH 0.308 (L) 0.350 - 4.500 uIU/mL    Comment: Performed by a 3rd Generation assay with a functional sensitivity of <=0.01 uIU/mL. Performed at [facility], [address] [place], [place] [address]   CBC     Status: Abnormal   Collection Time: 02/08/21  5:23 AM  Result Value Ref Range   WBC 14.6 (H) 4.0 - 10.5 K/uL   RBC 5.26 4.22 - 5.81 MIL/uL   Hemoglobin 14.2 13.0 - 17.0 g/dL   HCT 13.0 86.5 - 78.4 %   MCV 82.9 80.0 - 100.0 fL   MCH 27.0 26.0 - 34.0 pg   MCHC 32.6 30.0 - 36.0 g/dL   RDW 69.6 29.5 - 28.4 %   Platelets 291 150 - 400 K/uL   nRBC 0.0 0.0 - 0.2 %    Comment: Performed at [facility], [address], [place], [place] [address]  Magnesium     Status: None   Collection Time: 02/08/21  5:23 AM  Result Value Ref Range   Magnesium 2.2 1.7 - 2.4 mg/dL    Comment: Performed at [facility], [address] [place], [place] [address]   Basic metabolic panel     Status: Abnormal   Collection Time: 02/08/21  5:23 AM  Result Value Ref Range   Sodium 138 135 - 145 mmol/L   Potassium 4.2 3.5 - 5.1 mmol/L   Chloride 103 98 - 111 mmol/L   CO2 24 22 - 32 mmol/L   Glucose, Bld 145 (H) 70 - 99 mg/dL    Comment: Glucose reference range applies only to samples taken after fasting for at least 8 hours.   BUN 14 6 - 20 mg/dL   Creatinine, Ser 2.53 (L) 0.61 - 1.24 mg/dL   Calcium 9.7 8.9 - 66.4 mg/dL   GFR, Estimated >40 >34 mL/min    Comment: (NOTE) Calculated using the CKD-EPI Creatinine Equation (2021)    Anion gap 11 5 - 15    Comment: Performed at [facility], [address] [place], [place] [address]  T4, free     Status: None   Collection Time: 02/08/21 10:39 AM  Result Value Ref Range   Free T4 0.88 0.61 - 1.12 ng/dL    Comment: (NOTE) Biotin ingestion may interfere with  free T4 tests. If the results are inconsistent with the TSH level, previous test results, or the clinical presentation, then consider biotin interference. If needed, order repeat testing after stopping biotin. Performed at [facility], [address], [place], [place] [address]     Studies/Results:   MRI HEAD AND ORBITS WITHOUT AND WITH CONTRAST  FINDINGS: MRI HEAD FINDINGS   Brain: No acute infarct, mass effect or extra-axial collection. No acute or chronic hemorrhage. Normal white matter signal, parenchymal volume and CSF spaces. The midline structures are normal. The postcontrast images are severely motion degraded.   Vascular: Major flow voids are preserved.   Skull and upper cervical spine: Normal calvarium and skull base. Visualized upper cervical spine and soft tissues are normal.   MRI ORBITS FINDINGS   Orbits: There is abnormal contrast enhancement and enlargement of the left optic nerve. Extraocular muscles are normal. Both lobes are normal. The right optic nerve is normal.   Visualized sinuses: Clear   Soft  tissues: Normal   IMPRESSION: 1. Abnormal contrast enhancement and enlargement of the left optic nerve, consistent with optic neuritis. 2. Normal MRI of the brain.       Brain MRI is reviewed in person and shows no abnormalities involving the brain. No white matter lesions are noted. No encephalomalacia. There is increased signal semi homogeneously involving left optic nerve on DWI. There is seems to involve the entire optic nerve and is associated with faint diffuse enhancement.     CERVICAL AND THORACIC MRI SPINE FINDINGS: MRI CERVICAL SPINE FINDINGS   Alignment: Straightening of the cervical curvature.   Vertebrae: No fracture, evidence of discitis, or bone lesion.   Cord: Normal signal and morphology.   Posterior Fossa, vertebral arteries, paraspinal tissues: Negative.   Disc levels:   C2-3: No spinal canal or neural foraminal stenosis.   C3-4: Small posterior disc protrusion. No significant spinal canal or neural foraminal stenosis.   C4-5: No significant spinal canal or neural foraminal stenosis.   C5-6: No significant spinal canal or neural foraminal stenosis.   C6-7: No significant spinal canal or neural foraminal stenosis.   C7-T1: No significant spinal canal or neural foraminal stenosis.   MRI THORACIC SPINE FINDINGS   Alignment:  Physiologic.   Vertebrae: No fracture, evidence of discitis, or bone lesion. Segmentation defect at T6-7. Marrow edema in the articular processes of the right facet joint at T7-8, degenerative.   Cord: Focal anterior displacement of the spinal cord at the T6-7 level with widening of the dorsal CSF space suggestive of a dorsal arachnoid web. No cord signal abnormality.   Paraspinal and other soft tissues: Negative.   Disc levels:   Small posterior disc protrusions at T4-5, T5-6, T7-8 and T8-9 with mild mass effect on the cord at the T7-8 and T8-9 levels. No significant spinal canal or neural foraminal stenosis at  any thoracic level.   IMPRESSION: 1. No definitive cord signal abnormality seen. 2. Focal anterior displacement of the thoracic spinal cord at the T6-7 level with widening of the dorsal CSF space suggestive of a dorsal arachnoid web. 3. Mild degenerative changes of the cervical and thoracic spine without high-grade spinal canal or neural foraminal stenosis.     Cervical and the thoracic spine imaging MRI reviewed without evidence of intrinsic cord lesions noted.    Riel Hirschman A. Gerilyn Gonzalez, M.D.  Diplomate, Biomedical engineer of Psychiatry and Neurology ( Neurology). 02/08/2021, 5:50 PM

## 2021-02-08 NOTE — Progress Notes (Signed)
Progress Note  Patient Name: Ian Gonzalez Date of Encounter: 02/08/2021  [facility]  Subjective   No palpitations or chest pain.  Inpatient Medications    Scheduled Meds:  amLODipine  5 mg Oral Daily   diltiazem  5 mg Intravenous Once   enoxaparin (LOVENOX) injection  60 mg Subcutaneous Q24H   metoprolol succinate  25 mg Oral Daily   nicotine  21 mg Transdermal Daily   pantoprazole  40 mg Oral BID   QUEtiapine  50 mg Oral QHS   Continuous Infusions:  methylPREDNISolone (SOLU-MEDROL) injection 1,000 mg (02/07/21 1821)   PRN Meds: acetaminophen, hydrALAZINE, HYDROcodone-acetaminophen   Vital Signs    Vitals:   02/07/21 0543 02/07/21 1417 02/07/21 2116 02/08/21 0536  BP: (!) 156/104 (!) 164/94 124/79 (!) 142/79  Pulse: 76 99 78 66  Resp: 18 18 16 14   Temp: 98.6 F (37 C) 97.9 F (36.6 C) 98.2 F (36.8 C) 98.3 F (36.8 C)  TempSrc: Oral  Oral Oral  SpO2: 96% 95% 99% 98%  Weight:      Height:        Intake/Output Summary (Last 24 hours) at 02/08/2021 0926 Last data filed at 02/07/2021 1817 Gross per 24 hour  Intake 530 ml  Output --  Net 530 ml   Filed Weights   02/06/21 1415  Weight: 129.3 kg    Telemetry    Sinus rhythm.  Personally reviewed.  ECG    No ECG reviewed.  Physical Exam   GEN: No acute distress.   Neck: No JVD. Cardiac: RRR, no murmur, rub, or gallop.  Respiratory: Nonlabored. Clear to auscultation bilaterally. GI: Soft, nontender, bowel sounds present. MS: No edema.  Labs    Chemistry Recent Labs  Lab 02/05/21 2318 02/07/21 0541 02/07/21 1108 02/08/21 0523  NA 135 134* 135 138  K 3.8 3.7 3.5 4.2  CL 103 100 101 103  CO2 26 24 21* 24  GLUCOSE 105* 164* 256* 145*  BUN 9 10 13 14   CREATININE 0.62 0.58* 0.68 0.55*  CALCIUM 8.8* 9.5 9.6 9.7  PROT 7.2 8.0  --   --   ALBUMIN 3.8 4.2  --   --   AST 17 19  --   --   ALT 17 19  --   --   ALKPHOS 70 77  --   --   BILITOT 0.4 0.7  --   --    GFRNONAA >60 >60 >60 >60  ANIONGAP 6 10 13 11      Hematology Recent Labs  Lab 02/05/21 2318 02/07/21 0541 02/08/21 0523  WBC 12.5* 12.9* 14.6*  RBC 5.07 5.58 5.26  HGB 13.8 14.8 14.2  HCT 43.0 45.3 43.6  MCV 84.8 81.2 82.9  MCH 27.2 26.5 27.0  MCHC 32.1 32.7 32.6  RDW 13.4 12.8 13.4  PLT 261 294 291    Radiology    MR Brain W and Wo Contrast  Result Date: 02/06/2021 CLINICAL DATA:  Monocular vision loss, left.  Headache. EXAM: MRI HEAD AND ORBITS WITHOUT AND WITH CONTRAST TECHNIQUE: Multiplanar, multiecho pulse sequences of the brain and surrounding structures were obtained without and with intravenous contrast. Multiplanar, multiecho pulse sequences of the orbits and surrounding structures were obtained including fat saturation techniques, before and after intravenous contrast administration. CONTRAST:  10mL GADAVIST GADOBUTROL 1 MMOL/ML IV SOLN COMPARISON:  None. FINDINGS: MRI HEAD FINDINGS Brain: No acute infarct, mass effect or extra-axial collection. No acute or chronic hemorrhage. Normal  white matter signal, parenchymal volume and CSF spaces. The midline structures are normal. The postcontrast images are severely motion degraded. Vascular: Major flow voids are preserved. Skull and upper cervical spine: Normal calvarium and skull base. Visualized upper cervical spine and soft tissues are normal. MRI ORBITS FINDINGS Orbits: There is abnormal contrast enhancement and enlargement of the left optic nerve. Extraocular muscles are normal. Both lobes are normal. The right optic nerve is normal. Visualized sinuses: Clear Soft tissues: Normal IMPRESSION: 1. Abnormal contrast enhancement and enlargement of the left optic nerve, consistent with optic neuritis. 2. Normal MRI of the brain. Electronically Signed   By: Deatra Robinson M.D.   On: 02/06/2021 19:18   ECHOCARDIOGRAM COMPLETE  Result Date: 02/07/2021    ECHOCARDIOGRAM REPORT   Patient Name:   Ian Gonzalez Date of Exam: 02/07/2021  Medical Rec #:  [id number]      Height:       74.0 in Accession #:    [id number]     Weight:       285.0 lb Date of Birth:  May 22, 1986      BSA:          2.527 m Patient Age:    35 years       BP:           156/104 mmHg Patient Gender: M              HR:           102 bpm. [facility] Procedure: 2D Echo, Cardiac Doppler and Color Doppler Indications:    Abnormal ECG  History:        Patient has no prior history of Echocardiogram examinations.                 Abnormal ECG; Risk Factors:Current Smoker. Bipolar disorder.  Sonographer:    Mikki Harbor Referring Phys: [id number] Ellsworth Lennox IMPRESSIONS  1. Left ventricular ejection fraction, by estimation, is 60 to 65%. The left ventricle has normal function. The left ventricle has no regional wall motion abnormalities. Left ventricular diastolic parameters are indeterminate.  2. Right ventricular systolic function is normal. The right ventricular size is normal. Tricuspid regurgitation signal is inadequate for assessing PA pressure.  3. The mitral valve is grossly normal, mildly calcified. Trivial mitral valve regurgitation.  4. The aortic valve is tricuspid. Aortic valve regurgitation is not visualized. Aortic valve mean gradient measures 9.0 mmHg.  5. The inferior vena cava is normal in size with greater than 50% respiratory variability, suggesting right atrial pressure of 3 mmHg. Comparison(s): No prior Echocardiogram. FINDINGS  Left Ventricle: Left ventricular ejection fraction, by estimation, is 60 to 65%. The left ventricle has normal function. The left ventricle has no regional wall motion abnormalities. The left ventricular internal cavity size was normal in size. There is  borderline left ventricular hypertrophy. Left ventricular diastolic parameters are indeterminate. Right Ventricle: The right ventricular size is normal. No increase in right ventricular wall thickness. Right ventricular systolic function is normal. Tricuspid  regurgitation signal is inadequate for assessing PA pressure. Left Atrium: Left atrial size was normal in size. Right Atrium: Right atrial size was normal in size. Pericardium: There is no evidence of pericardial effusion. Mitral Valve: The mitral valve is grossly normal. There is mild calcification of the mitral valve leaflet(s). Mild mitral annular calcification. Trivial mitral valve regurgitation. MV peak gradient, 9.7 mmHg. The mean mitral valve gradient is 5.0 mmHg. Tricuspid Valve: The tricuspid  valve is grossly normal. Tricuspid valve regurgitation is trivial. Aortic Valve: The aortic valve is tricuspid. There is mild aortic valve annular calcification. Aortic valve regurgitation is not visualized. Aortic valve mean gradient measures 9.0 mmHg. Aortic valve peak gradient measures 17.0 mmHg. Aortic valve area, by VTI measures 3.08 cm. Pulmonic Valve: The pulmonic valve was grossly normal. Pulmonic valve regurgitation is trivial. Aorta: The aortic root is normal in size and structure. Venous: The inferior vena cava is normal in size with greater than 50% respiratory variability, suggesting right atrial pressure of 3 mmHg. IAS/Shunts: No atrial level shunt detected by color flow Doppler.  LEFT VENTRICLE PLAX 2D LVIDd:         5.41 cm  Diastology LVIDs:         3.20 cm  LV e' medial:    18.00 cm/s LV PW:         1.03 cm  LV E/e' medial:  8.1 LV IVS:        1.09 cm  LV e' lateral:   18.20 cm/s LVOT diam:     2.10 cm  LV E/e' lateral: 8.0 LV SV:         107 LV SV Index:   42 LVOT Area:     3.46 cm  RIGHT VENTRICLE RV Basal diam:  3.66 cm RV Mid diam:    3.59 cm RV S prime:     20.90 cm/s TAPSE (M-mode): 2.3 cm LEFT ATRIUM             Index       RIGHT ATRIUM           Index LA diam:        4.50 cm 1.78 cm/m  RA Area:     18.60 cm LA Vol (A2C):   79.2 ml 31.35 ml/m RA Volume:   51.30 ml  20.30 ml/m LA Vol (A4C):   47.9 ml 18.96 ml/m LA Biplane Vol: 63.4 ml 25.09 ml/m  AORTIC VALVE AV Area (Vmax):    2.91 cm  AV Area (Vmean):   2.39 cm AV Area (VTI):     3.08 cm AV Vmax:           206.00 cm/s AV Vmean:          137.000 cm/s AV VTI:            0.348 m AV Peak Grad:      17.0 mmHg AV Mean Grad:      9.0 mmHg LVOT Vmax:         173.00 cm/s LVOT Vmean:        94.700 cm/s LVOT VTI:          0.309 m LVOT/AV VTI ratio: 0.89 MITRAL VALVE MV Area (PHT): 4.74 cm     SHUNTS MV Area VTI:   3.60 cm     Systemic VTI:  0.31 m MV Peak grad:  9.7 mmHg     Systemic Diam: 2.10 cm MV Mean grad:  5.0 mmHg MV Vmax:       1.56 m/s MV Vmean:      99.7 cm/s MV Decel Time: 160 msec MV E velocity: 145.00 cm/s Nona Dell MD Electronically signed by Nona Dell MD Signature Date/Time: 02/07/2021/4:47:41 PM    Final    MR ORBITS W WO CONTRAST  Result Date: 02/06/2021 CLINICAL DATA:  Monocular vision loss, left.  Headache. EXAM: MRI HEAD AND ORBITS WITHOUT AND WITH CONTRAST TECHNIQUE: Multiplanar, multiecho pulse sequences of the  brain and surrounding structures were obtained without and with intravenous contrast. Multiplanar, multiecho pulse sequences of the orbits and surrounding structures were obtained including fat saturation techniques, before and after intravenous contrast administration. CONTRAST:  72mL GADAVIST GADOBUTROL 1 MMOL/ML IV SOLN COMPARISON:  None. FINDINGS: MRI HEAD FINDINGS Brain: No acute infarct, mass effect or extra-axial collection. No acute or chronic hemorrhage. Normal white matter signal, parenchymal volume and CSF spaces. The midline structures are normal. The postcontrast images are severely motion degraded. Vascular: Major flow voids are preserved. Skull and upper cervical spine: Normal calvarium and skull base. Visualized upper cervical spine and soft tissues are normal. MRI ORBITS FINDINGS Orbits: There is abnormal contrast enhancement and enlargement of the left optic nerve. Extraocular muscles are normal. Both lobes are normal. The right optic nerve is normal. Visualized sinuses: Clear Soft tissues:  Normal IMPRESSION: 1. Abnormal contrast enhancement and enlargement of the left optic nerve, consistent with optic neuritis. 2. Normal MRI of the brain. Electronically Signed   By: Deatra Robinson M.D.   On: 02/06/2021 19:18    Assessment & Plan    1.  PSVT, recently documented without prior history of cardiac arrhythmia.  ECG with normal intervals, no preexcitation.  Possibly reentrant mechanism such as AVRT or AVNRT.  LVEF normal at 60 to 65% with no significant valvular abnormalities.  Beta-blocker initiated.  2.  Left-sided optic neuritis, currently on high-dose steroids.  Neurology following.  3. Tobacco abuse.  4.  TSH low at 0.308, consider full thyroid panel and work-up per primary team.  Continue Toprol-XL 25 mg daily, could be uptitrated if necessary.  No further cardiac intervention at this time.  He is on telemetry.  Signed, Nona Dell, MD  02/08/2021, 9:26 AM

## 2021-02-08 NOTE — Discharge Summary (Addendum)
Physician Discharge Summary  MENDEL BINSFELD WUJ:[id number] DOB: 10-27-1985 DOA: 02/06/2021  PCP: Patient, No Pcp Per (Inactive)  Admit date: 02/06/2021 Discharge date: 02/08/2021  Time spent: 55 minutes  Recommendations for Outpatient Follow-up:  Follow-up with PCP in 2 weeks.  On follow-up patient will need a basic metabolic profile done to follow-up on electrolytes and renal function.  PSVT will need to be followed up upon as well as blood pressure. Follow-up with Dr. Gerilyn Pilgrim, neurology in 6 weeks.   Discharge Diagnoses:  Principal Problem:   Optic neuritis Active Problems:   PSVT (paroxysmal supraventricular tachycardia) (HCC)   Leukocytosis   Obesity (BMI 30-39.9)   Tobacco use   Discharge Condition: Stable and improved  Diet recommendation: Heart healthy  Filed Weights   02/06/21 1415  Weight: 129.3 kg    History of present illness:  HPI per Dr. Irene Pap MORDECAI TINDOL is a 35 y.o. male with medical history significant for obesity and tobacco use who presents to the emergency department due to 3-day onset of left-sided headache and 2-day onset of left blurry vision.  He complained of left-sided headache which started on Friday (8/26) afternoon, headache was described as pressure-like on the left side of the head and patient thought that it was a migraine, he went to bed with a headache on Friday night, on waking up on Saturday morning, he noted a left-sided blurry vision which has been persistent since onset.  Patient denies use of glasses, trauma or infection.  He states that he had a similar headache in the past, but it was not associated with blurry vision at that time.  He denies chest pain, shortness of breath, fever, nausea, vomiting, alcohol use or use of any recreational drugs.   ED Course:  In the emergency department, he was hemodynamically stable, BP was 153/82.  Work-up in the ED showed normal CBC except for leukocytosis.  Normal BMP, CRP and sed rate within normal  range.  Influenza A, B, SARS coronavirus 2 was negative. MRI head and orbits without and with contrast was consistent with optic neuritis, but showed normal MRI of the brain.  CT of head without contrast showed no acute intracranial pathology Neurologist at [facility] (Dr. Amada Jupiter) was consulted and recommended MRI (reported above) and suggested IV Solu-Medrol 1 g x 5 days if positive for optic neuritis.  Patient was treated with Tylenol, Benadryl, Gadavist and Reglan.  Solu-Medrol 1 g was given in the ED.  Hospitalist was asked to admit.  For further evaluation and management.  Hospital Course:  1 left sided optic neuritis -Patient presented with headache, blurry vision/visual changes in the left eye. -CT head done negative for any acute abnormalities. -MRI brain/MRI orbits with abnormal contrast enhancement and enlargement of the left optic nerve consistent with optic neuritis, normal MRI brain. -Case discussed with neurology on-call, Dr. Amada Jupiter who had recommended continuation of high-dose Solu-Medrol 1 g x 5 days, GI prophylaxis as well with formal inpatient neurology. -Patient seen in consultation by Dr. Gerilyn Pilgrim who recommended further evaluation with MRI of the T and C-spine which were done without evidence of intrinsic cord lesions as noted per neurology. -Patient improved significantly and quickly that it was for a 3-day course of high-dose Solu-Medrol was sufficient.  Lab work was also obtained and pending at the time of discharge. -Patient was discharged home in stable and improved condition with outpatient follow-up with neurology in 6 weeks.  2.  PSVT -Patient noted to have PSVT during the hospitalization without  any prior history of cardiac arrhythmias. -EKG with normal sinus rhythm and normal intervals. -2D echo done with normal EF of 60 to 65%,nwma. -Patient seen in consultation by cardiology and beta-blocker initiated. -Patient improved and had no further episodes. -TSH done was  minimally decreased at 0.308.  Free T4 done was within normal limits. -Per cardiology no further cardiac work-up needed at this time. -Patient was discharged home on Toprol XL with outpatient follow-up with PCP.  3.  Tobacco abuse -Tobacco cessation stressed to patient. -Patient maintained on a nicotine patch.  4.  Obesity type II -Lifestyle modifications stressed to patient.  5.  Leukocytosis -Felt secondary to IV steroids. -Patient with no signs or symptoms of infection. -Outpatient follow-up with PCP.  Procedures: MRI T and C-spine 02/08/2021 MRI brain/MRI orbits 02/06/2021 CT head 02/05/2021 2D echo 02/07/2021    Consultations: Neurology: Dr. Gerilyn Pilgrim 02/08/2020 Cardiology: Dr. Diona Browner 02/08/2020  Discharge Exam: Vitals:   02/08/21 0536 02/08/21 1442  BP: (!) 142/79 138/89  Pulse: 66 88  Resp: 14 18  Temp: 98.3 F (36.8 C) 98.3 F (36.8 C)  SpO2: 98% 98%    General: NAD Cardiovascular: RRR no murmurs rubs or gallops.  No JVD.  No lower extremity edema Respiratory: Clear to auscultation bilaterally.  No wheezes, no crackles, no rhonchi.  Discharge Instructions   Discharge Instructions     Diet - low sodium heart healthy   Complete by: As directed    Increase activity slowly   Complete by: As directed       Allergies as of 02/08/2021   No Known Allergies      Medication List     STOP taking these medications    HYDROcodone-acetaminophen 5-325 MG tablet Commonly known as: NORCO/VICODIN   ibuprofen 800 MG tablet Commonly known as: ADVIL   naproxen 375 MG tablet Commonly known as: NAPROSYN   penicillin v potassium 500 MG tablet Commonly known as: VEETID       TAKE these medications    amLODipine 5 MG tablet Commonly known as: NORVASC Take 1 tablet (5 mg total) by mouth daily. Start taking on: February 09, 2021   metoprolol succinate 25 MG 24 hr tablet Commonly known as: TOPROL-XL Take 1 tablet (25 mg total) by mouth daily. Start  taking on: February 09, 2021   nicotine 21 mg/24hr patch Commonly known as: NICODERM CQ - dosed in mg/24 hours Place 1 patch (21 mg total) onto the skin daily. Start taking on: February 09, 2021   pantoprazole 40 MG tablet Commonly known as: PROTONIX Take 1 tablet (40 mg total) by mouth daily.   QUEtiapine 100 MG tablet Commonly known as: SEROQUEL Take 100 mg by mouth at bedtime.       No Known Allergies  Follow-up Information     PCP. Schedule an appointment as soon as possible for a visit in 2 week(s).          Beryle Beams, MD Follow up in 6 week(s).   Specialty: Neurology Contact information: [address] [place] [address] [phone]                  The results of significant diagnostics from this hospitalization (including imaging, microbiology, ancillary and laboratory) are listed below for reference.    Significant Diagnostic Studies: CT HEAD WO CONTRAST ( )  Result Date: 02/05/2021 CLINICAL DATA:  Migraine headache and blurry vision in the left eye. EXAM: CT HEAD WITHOUT CONTRAST TECHNIQUE: Contiguous axial images were obtained from the base  of the skull through the vertex without intravenous contrast. COMPARISON:  None. FINDINGS: Brain: No evidence of acute infarction, hemorrhage, hydrocephalus, extra-axial collection or mass lesion/mass effect. Vascular: No hyperdense vessel or unexpected calcification. Skull: Normal. Negative for fracture or focal lesion. Sinuses/Orbits: No acute finding. Other: None. IMPRESSION: No acute intracranial pathology. Electronically Signed   By: Aram Candela M.D.   On: 02/05/2021 20:25   MR Brain W and Wo Contrast  Result Date: 02/06/2021 CLINICAL DATA:  Monocular vision loss, left.  Headache. EXAM: MRI HEAD AND ORBITS WITHOUT AND WITH CONTRAST TECHNIQUE: Multiplanar, multiecho pulse sequences of the brain and surrounding structures were obtained without and with intravenous contrast. Multiplanar, multiecho pulse  sequences of the orbits and surrounding structures were obtained including fat saturation techniques, before and after intravenous contrast administration. CONTRAST:  10mL GADAVIST GADOBUTROL 1 MMOL/ML IV SOLN COMPARISON:  None. FINDINGS: MRI HEAD FINDINGS Brain: No acute infarct, mass effect or extra-axial collection. No acute or chronic hemorrhage. Normal white matter signal, parenchymal volume and CSF spaces. The midline structures are normal. The postcontrast images are severely motion degraded. Vascular: Major flow voids are preserved. Skull and upper cervical spine: Normal calvarium and skull base. Visualized upper cervical spine and soft tissues are normal. MRI ORBITS FINDINGS Orbits: There is abnormal contrast enhancement and enlargement of the left optic nerve. Extraocular muscles are normal. Both lobes are normal. The right optic nerve is normal. Visualized sinuses: Clear Soft tissues: Normal IMPRESSION: 1. Abnormal contrast enhancement and enlargement of the left optic nerve, consistent with optic neuritis. 2. Normal MRI of the brain. Electronically Signed   By: Deatra Robinson M.D.   On: 02/06/2021 19:18   MR CERVICAL SPINE WO CONTRAST  Result Date: 02/08/2021 CLINICAL DATA:  Demyelinating disease. EXAM: MRI CERVICAL AND THORACIC SPINE WITHOUT CONTRAST TECHNIQUE: Multiplanar and multiecho pulse sequences of the cervical spine, to include the craniocervical junction and cervicothoracic junction, and the thoracic spine, were obtained without intravenous contrast. COMPARISON:  None. FINDINGS: MRI CERVICAL SPINE FINDINGS Alignment: Straightening of the cervical curvature. Vertebrae: No fracture, evidence of discitis, or bone lesion. Cord: Normal signal and morphology. Posterior Fossa, vertebral arteries, paraspinal tissues: Negative. Disc levels: C2-3: No spinal canal or neural foraminal stenosis. C3-4: Small posterior disc protrusion. No significant spinal canal or neural foraminal stenosis. C4-5: No  significant spinal canal or neural foraminal stenosis. C5-6: No significant spinal canal or neural foraminal stenosis. C6-7: No significant spinal canal or neural foraminal stenosis. C7-T1: No significant spinal canal or neural foraminal stenosis. MRI THORACIC SPINE FINDINGS Alignment:  Physiologic. Vertebrae: No fracture, evidence of discitis, or bone lesion. Segmentation defect at T6-7. Marrow edema in the articular processes of the right facet joint at T7-8, degenerative. Cord: Focal anterior displacement of the spinal cord at the T6-7 level with widening of the dorsal CSF space suggestive of a dorsal arachnoid web. No cord signal abnormality. Paraspinal and other soft tissues: Negative. Disc levels: Small posterior disc protrusions at T4-5, T5-6, T7-8 and T8-9 with mild mass effect on the cord at the T7-8 and T8-9 levels. No significant spinal canal or neural foraminal stenosis at any thoracic level. IMPRESSION: 1. No definitive cord signal abnormality seen. 2. Focal anterior displacement of the thoracic spinal cord at the T6-7 level with widening of the dorsal CSF space suggestive of a dorsal arachnoid web. 3. Mild degenerative changes of the cervical and thoracic spine without high-grade spinal canal or neural foraminal stenosis. Electronically Signed   By: Baldemar Lenis  M.D.   On: 02/08/2021 16:55   MR THORACIC SPINE WO CONTRAST  Result Date: 02/08/2021 CLINICAL DATA:  Demyelinating disease. EXAM: MRI CERVICAL AND THORACIC SPINE WITHOUT CONTRAST TECHNIQUE: Multiplanar and multiecho pulse sequences of the cervical spine, to include the craniocervical junction and cervicothoracic junction, and the thoracic spine, were obtained without intravenous contrast. COMPARISON:  None. FINDINGS: MRI CERVICAL SPINE FINDINGS Alignment: Straightening of the cervical curvature. Vertebrae: No fracture, evidence of discitis, or bone lesion. Cord: Normal signal and morphology. Posterior Fossa, vertebral  arteries, paraspinal tissues: Negative. Disc levels: C2-3: No spinal canal or neural foraminal stenosis. C3-4: Small posterior disc protrusion. No significant spinal canal or neural foraminal stenosis. C4-5: No significant spinal canal or neural foraminal stenosis. C5-6: No significant spinal canal or neural foraminal stenosis. C6-7: No significant spinal canal or neural foraminal stenosis. C7-T1: No significant spinal canal or neural foraminal stenosis. MRI THORACIC SPINE FINDINGS Alignment:  Physiologic. Vertebrae: No fracture, evidence of discitis, or bone lesion. Segmentation defect at T6-7. Marrow edema in the articular processes of the right facet joint at T7-8, degenerative. Cord: Focal anterior displacement of the spinal cord at the T6-7 level with widening of the dorsal CSF space suggestive of a dorsal arachnoid web. No cord signal abnormality. Paraspinal and other soft tissues: Negative. Disc levels: Small posterior disc protrusions at T4-5, T5-6, T7-8 and T8-9 with mild mass effect on the cord at the T7-8 and T8-9 levels. No significant spinal canal or neural foraminal stenosis at any thoracic level. IMPRESSION: 1. No definitive cord signal abnormality seen. 2. Focal anterior displacement of the thoracic spinal cord at the T6-7 level with widening of the dorsal CSF space suggestive of a dorsal arachnoid web. 3. Mild degenerative changes of the cervical and thoracic spine without high-grade spinal canal or neural foraminal stenosis. Electronically Signed   By: Baldemar LenisKatyucia  de Macedo Rodrigues M.D.   On: 02/08/2021 16:55   ECHOCARDIOGRAM COMPLETE  Result Date: 02/07/2021    ECHOCARDIOGRAM REPORT   Patient Name:   Nada BoozerDWARD M Follansbee Date of Exam: 02/07/2021 Medical Rec #:  [id number]      Height:       74.0 in Accession #:    [id number]     Weight:       285.0 lb Date of Birth:  1985-11-12      BSA:          2.527 m Patient Age:    35 years       BP:           156/104 mmHg Patient Gender: M              HR:            102 bpm. [facility] Procedure: 2D Echo, Cardiac Doppler and Color Doppler Indications:    Abnormal ECG  History:        Patient has no prior history of Echocardiogram examinations.                 Abnormal ECG; Risk Factors:Current Smoker. Bipolar disorder.  Sonographer:    Mikki Harbororothy Buchanan Referring Phys: [id number] Ellsworth LennoxBRITTANY M STRADER IMPRESSIONS  1. Left ventricular ejection fraction, by estimation, is 60 to 65%. The left ventricle has normal function. The left ventricle has no regional wall motion abnormalities. Left ventricular diastolic parameters are indeterminate.  2. Right ventricular systolic function is normal. The right ventricular size is normal. Tricuspid regurgitation signal is inadequate for assessing PA pressure.  3. The  mitral valve is grossly normal, mildly calcified. Trivial mitral valve regurgitation.  4. The aortic valve is tricuspid. Aortic valve regurgitation is not visualized. Aortic valve mean gradient measures 9.0 mmHg.  5. The inferior vena cava is normal in size with greater than 50% respiratory variability, suggesting right atrial pressure of 3 mmHg. Comparison(s): No prior Echocardiogram. FINDINGS  Left Ventricle: Left ventricular ejection fraction, by estimation, is 60 to 65%. The left ventricle has normal function. The left ventricle has no regional wall motion abnormalities. The left ventricular internal cavity size was normal in size. There is  borderline left ventricular hypertrophy. Left ventricular diastolic parameters are indeterminate. Right Ventricle: The right ventricular size is normal. No increase in right ventricular wall thickness. Right ventricular systolic function is normal. Tricuspid regurgitation signal is inadequate for assessing PA pressure. Left Atrium: Left atrial size was normal in size. Right Atrium: Right atrial size was normal in size. Pericardium: There is no evidence of pericardial effusion. Mitral Valve: The mitral valve is grossly normal.  There is mild calcification of the mitral valve leaflet(s). Mild mitral annular calcification. Trivial mitral valve regurgitation. MV peak gradient, 9.7 mmHg. The mean mitral valve gradient is 5.0 mmHg. Tricuspid Valve: The tricuspid valve is grossly normal. Tricuspid valve regurgitation is trivial. Aortic Valve: The aortic valve is tricuspid. There is mild aortic valve annular calcification. Aortic valve regurgitation is not visualized. Aortic valve mean gradient measures 9.0 mmHg. Aortic valve peak gradient measures 17.0 mmHg. Aortic valve area, by VTI measures 3.08 cm. Pulmonic Valve: The pulmonic valve was grossly normal. Pulmonic valve regurgitation is trivial. Aorta: The aortic root is normal in size and structure. Venous: The inferior vena cava is normal in size with greater than 50% respiratory variability, suggesting right atrial pressure of 3 mmHg. IAS/Shunts: No atrial level shunt detected by color flow Doppler.  LEFT VENTRICLE PLAX 2D LVIDd:         5.41 cm  Diastology LVIDs:         3.20 cm  LV e' medial:    18.00 cm/s LV PW:         1.03 cm  LV E/e' medial:  8.1 LV IVS:        1.09 cm  LV e' lateral:   18.20 cm/s LVOT diam:     2.10 cm  LV E/e' lateral: 8.0 LV SV:         107 LV SV Index:   42 LVOT Area:     3.46 cm  RIGHT VENTRICLE RV Basal diam:  3.66 cm RV Mid diam:    3.59 cm RV S prime:     20.90 cm/s TAPSE (M-mode): 2.3 cm LEFT ATRIUM             Index       RIGHT ATRIUM           Index LA diam:        4.50 cm 1.78 cm/m  RA Area:     18.60 cm LA Vol (A2C):   79.2 ml 31.35 ml/m RA Volume:   51.30 ml  20.30 ml/m LA Vol (A4C):   47.9 ml 18.96 ml/m LA Biplane Vol: 63.4 ml 25.09 ml/m  AORTIC VALVE AV Area (Vmax):    2.91 cm AV Area (Vmean):   2.39 cm AV Area (VTI):     3.08 cm AV Vmax:           206.00 cm/s AV Vmean:  137.000 cm/s AV VTI:            0.348 m AV Peak Grad:      17.0 mmHg AV Mean Grad:      9.0 mmHg LVOT Vmax:         173.00 cm/s LVOT Vmean:        94.700 cm/s LVOT  VTI:          0.309 m LVOT/AV VTI ratio: 0.89 MITRAL VALVE MV Area (PHT): 4.74 cm     SHUNTS MV Area VTI:   3.60 cm     Systemic VTI:  0.31 m MV Peak grad:  9.7 mmHg     Systemic Diam: 2.10 cm MV Mean grad:  5.0 mmHg MV Vmax:       1.56 m/s MV Vmean:      99.7 cm/s MV Decel Time: 160 msec MV E velocity: 145.00 cm/s Nona Dell MD Electronically signed by Nona Dell MD Signature Date/Time: 02/07/2021/4:47:41 PM    Final    MR ORBITS W WO CONTRAST  Result Date: 02/06/2021 CLINICAL DATA:  Monocular vision loss, left.  Headache. EXAM: MRI HEAD AND ORBITS WITHOUT AND WITH CONTRAST TECHNIQUE: Multiplanar, multiecho pulse sequences of the brain and surrounding structures were obtained without and with intravenous contrast. Multiplanar, multiecho pulse sequences of the orbits and surrounding structures were obtained including fat saturation techniques, before and after intravenous contrast administration. CONTRAST:  10mL GADAVIST GADOBUTROL 1 MMOL/ML IV SOLN COMPARISON:  None. FINDINGS: MRI HEAD FINDINGS Brain: No acute infarct, mass effect or extra-axial collection. No acute or chronic hemorrhage. Normal white matter signal, parenchymal volume and CSF spaces. The midline structures are normal. The postcontrast images are severely motion degraded. Vascular: Major flow voids are preserved. Skull and upper cervical spine: Normal calvarium and skull base. Visualized upper cervical spine and soft tissues are normal. MRI ORBITS FINDINGS Orbits: There is abnormal contrast enhancement and enlargement of the left optic nerve. Extraocular muscles are normal. Both lobes are normal. The right optic nerve is normal. Visualized sinuses: Clear Soft tissues: Normal IMPRESSION: 1. Abnormal contrast enhancement and enlargement of the left optic nerve, consistent with optic neuritis. 2. Normal MRI of the brain. Electronically Signed   By: Deatra Robinson M.D.   On: 02/06/2021 19:18    Microbiology: Recent Results (from the  past 240 hour(s))  Resp Panel by RT-PCR (Flu A&B, Covid) Nasopharyngeal Swab     Status: None   Collection Time: 02/06/21  7:45 PM   Specimen: Nasopharyngeal Swab; Nasopharyngeal(NP) swabs in vial transport medium  Result Value Ref Range Status   SARS Coronavirus 2 by RT PCR NEGATIVE NEGATIVE Final    Comment: (NOTE) SARS-CoV-2 target nucleic acids are NOT DETECTED.  The SARS-CoV-2 RNA is generally detectable in upper respiratory specimens during the acute phase of infection. The lowest concentration of SARS-CoV-2 viral copies this assay can detect is 138 copies/mL. A negative result does not preclude SARS-Cov-2 infection and should not be used as the sole basis for treatment or other patient management decisions. A negative result may occur with  improper specimen collection/handling, submission of specimen other than nasopharyngeal swab, presence of viral mutation(s) within the areas targeted by this assay, and inadequate number of viral copies(<138 copies/mL). A negative result must be combined with clinical observations, patient history, and epidemiological information. The expected result is Negative.  Fact Sheet for Patients:  BloggerCourse.com  Fact Sheet for Healthcare Providers:  SeriousBroker.it  This test is no t yet approved or cleared  by the [place] and  has been authorized for detection and/or diagnosis of SARS-CoV-2 by FDA under an Emergency Use Authorization (EUA). This EUA will remain  in effect (meaning this test can be used) for the duration of the COVID-19 declaration under Section 564(b)(1) of the Act, 21 U.S.C.section 360bbb-3(b)(1), unless the authorization is terminated  or revoked sooner.       Influenza A by PCR NEGATIVE NEGATIVE Final   Influenza B by PCR NEGATIVE NEGATIVE Final    Comment: (NOTE) The Xpert Xpress SARS-CoV-2/FLU/RSV plus assay is intended as an aid in the diagnosis of  influenza from Nasopharyngeal swab specimens and should not be used as a sole basis for treatment. Nasal washings and aspirates are unacceptable for Xpert Xpress SARS-CoV-2/FLU/RSV testing.  Fact Sheet for Patients: BloggerCourse.com  Fact Sheet for Healthcare Providers: SeriousBroker.it  This test is not yet approved or cleared by the [place] FDA and has been authorized for detection and/or diagnosis of SARS-CoV-2 by FDA under an Emergency Use Authorization (EUA). This EUA will remain in effect (meaning this test can be used) for the duration of the COVID-19 declaration under Section 564(b)(1) of the Act, 21 U.S.C. section 360bbb-3(b)(1), unless the authorization is terminated or revoked.  Performed at [facility], [address], [place], [place] [address]      Labs: Basic Metabolic Panel: Recent Labs  Lab 02/05/21 2318 02/07/21 0541 02/07/21 1108 02/08/21 0523  NA 135 134* 135 138  K 3.8 3.7 3.5 4.2  CL 103 100 101 103  CO2 26 24 21* 24  GLUCOSE 105* 164* 256* 145*  BUN 9 10 13 14   CREATININE 0.62 0.58* 0.68 0.55*  CALCIUM 8.8* 9.5 9.6 9.7  MG  --  1.7 1.5* 2.2  PHOS  --  3.4  --   --    Liver Function Tests: Recent Labs  Lab 02/05/21 2318 02/07/21 0541  AST 17 19  ALT 17 19  ALKPHOS 70 77  BILITOT 0.4 0.7  PROT 7.2 8.0  ALBUMIN 3.8 4.2   No results for input(s): LIPASE, AMYLASE in the last 168 hours. No results for input(s): AMMONIA in the last 168 hours. CBC: Recent Labs  Lab 02/05/21 2318 02/07/21 0541 02/08/21 0523  WBC 12.5* 12.9* 14.6*  NEUTROABS 8.9*  --   --   HGB 13.8 14.8 14.2  HCT 43.0 45.3 43.6  MCV 84.8 81.2 82.9  PLT 261 294 291   Cardiac Enzymes: No results for input(s): CKTOTAL, CKMB, CKMBINDEX, TROPONINI in the last 168 hours. BNP: BNP (last 3 results) No results for input(s): BNP in the last 8760 hours.  ProBNP (last 3 results) No results for input(s): PROBNP in the  last 8760 hours.  CBG: No results for input(s): GLUCAP in the last 168 hours.     Signed:  02/10/21 MD.  [facility] 02/08/2021, 8:13 PM

## 2021-02-09 LAB — RPR: RPR Ser Ql: NONREACTIVE

## 2021-02-10 LAB — NEUROMYELITIS OPTICA AUTOAB, IGG: NMO-IgG: <1.5 U/mL (ref 0.0–3.0)

## 2021-02-11 LAB — HOMOCYSTEINE: Homocysteine: 7.2 umol/L (ref 0.0–14.5)

## 2021-03-08 NOTE — Progress Notes (Deleted)
Cardiology Office Note  Date: 03/08/2021   ID: Ian Gonzalez, DOB [birth year], MRN [id number]  PCP:  Patient, No Pcp Per (Inactive)  Cardiologist:  Nona Dell, MD Electrophysiologist:  None   Chief Complaint: Hospital follow-up  History of Present Illness: Ian Gonzalez is a 35 y.o. male with a history of PSVT, obesity, leukocytosis, tobacco abuse, sleep disorder, depression, bipolar disorder.  Recent hospital admission for optic neuritis, PSVT, leukocytosis, obesity, tobacco abuse. Presented to the emergency room with 3-day onset of left-sided headache and 2-day onset of left leg abrasion.  Work-up in ER showed normal CBC except for leukocytosis.  He was noted to have PSVT during hospitalization without any history of cardiac arrhythmias.  He was seen in consultation by cardiology and beta-blocker was initiated.  His TSH was done and minimally decreased at 0.308.  Free T4 was within normal limits.  Per cardiology there was no need for cardiac work-up needed at that time.  He was encouraged to stop nicotine.  Past Medical History:  Diagnosis Date   Bipolar 1 disorder (HCC)    Depression    Sleep disorder     Past Surgical History:  Procedure Laterality Date   CHOLECYSTECTOMY N/A 03/18/2015   Procedure: LAPAROSCOPIC CHOLECYSTECTOMY;  Surgeon: Franky Macho, MD;  Location: [facility];  Service: General;  Laterality: N/A;   NO PAST SURGERIES      Current Outpatient Medications  Medication Sig Dispense Refill   amLODipine (NORVASC) 5 MG tablet Take 1 tablet (5 mg total) by mouth daily. 30 tablet 1   metoprolol succinate (TOPROL-XL) 25 MG 24 hr tablet Take 1 tablet (25 mg total) by mouth daily. 30 tablet 1   nicotine (NICODERM CQ - DOSED IN MG/24 HOURS) 21 mg/24hr patch Place 1 patch (21 mg total) onto the skin daily. 28 patch 0   pantoprazole (PROTONIX) 40 MG tablet Take 1 tablet (40 mg total) by mouth daily. 30 tablet 1   QUEtiapine (SEROQUEL) 100 MG tablet Take 100 mg by mouth  at bedtime.     No current facility-administered medications for this visit.   Allergies:  Patient has no known allergies.   Social History: The patient  reports that he has been smoking cigarettes. He has a 10.00 pack-year smoking history. He has never used smokeless tobacco. He reports current alcohol use. He reports that he does not use drugs.   Family History: The patient's family history includes Heart failure in his maternal grandmother.   ROS:  Please see the history of present illness. Otherwise, complete review of systems is positive for {NONE DEFAULTED:18576}.  All other systems are reviewed and negative.   Physical Exam: VS:  There were no vitals taken for this visit., BMI There is no height or weight on file to calculate BMI.  Wt Readings from Last 3 Encounters:  02/06/21 285 lb (129.3 kg)  02/05/21 285 lb (129.3 kg)  08/21/18 285 lb (129.3 kg)    General: Patient appears comfortable at rest. HEENT: Conjunctiva and lids normal, oropharynx clear with moist mucosa. Neck: Supple, no elevated JVP or carotid bruits, no thyromegaly. Lungs: Clear to auscultation, nonlabored breathing at rest. Cardiac: Regular rate and rhythm, no S3 or significant systolic murmur, no pericardial rub. Abdomen: Soft, nontender, no hepatomegaly, bowel sounds present, no guarding or rebound. Extremities: No pitting edema, distal pulses 2+. Skin: Warm and dry. Musculoskeletal: No kyphosis. Neuropsychiatric: Alert and oriented x3, affect grossly appropriate.  ECG:  {EKG/Telemetry Strips Reviewed:[phone]}  Recent Labwork: 02/07/2021:  ALT 19; AST 19 02/08/2021: BUN 14; Creatinine, Ser 0.55; Hemoglobin 14.2; Magnesium 2.2; Platelets 291; Potassium 4.2; Sodium 138; TSH 0.308  No results found for: CHOL, TRIG, HDL, CHOLHDL, VLDL, LDLCALC, LDLDIRECT  Other Studies Reviewed Today:   Echocardiogram 02/07/2021  1. Left ventricular ejection fraction, by estimation, is 60 to 65%. The  left ventricle has  normal function. The left ventricle has no regional  wall motion abnormalities. Left ventricular diastolic parameters are  indeterminate.   2. Right ventricular systolic function is normal. The right ventricular  size is normal. Tricuspid regurgitation signal is inadequate for assessing  PA pressure.   3. The mitral valve is grossly normal, mildly calcified. Trivial mitral  valve regurgitation.   4. The aortic valve is tricuspid. Aortic valve regurgitation is not  visualized. Aortic valve mean gradient measures 9.0 mmHg.   5. The inferior vena cava is normal in size with greater than 50%  respiratory variability, suggesting right atrial pressure of 3 mmHg.   Comparison(s): No prior Echocardiogram.  Assessment and Plan:  1. PSVT (paroxysmal supraventricular tachycardia) (HCC)   2. Tobacco abuse   3. Obesity (BMI 30-39.9)      Medication Adjustments/Labs and Tests Ordered: Current medicines are reviewed at length with the patient today.  Concerns regarding medicines are outlined above.   Disposition: Follow-up with ***  Signed, Rennis Harding, NP 03/08/2021 10:49 PM    [facility] [address] [place], [place], [place] [address] Phone: [phone]; Fax: [phone]

## 2021-03-09 ENCOUNTER — Ambulatory Visit: Payer: BC Managed Care – PPO | Admitting: Family Medicine

## 2021-03-09 DIAGNOSIS — I471 Supraventricular tachycardia: Secondary | ICD-10-CM

## 2021-03-09 DIAGNOSIS — E669 Obesity, unspecified: Secondary | ICD-10-CM

## 2021-03-09 DIAGNOSIS — Z720 Tobacco use: Secondary | ICD-10-CM

## 2021-03-26 ENCOUNTER — Other Ambulatory Visit: Payer: Self-pay

## 2021-03-26 ENCOUNTER — Ambulatory Visit
Admission: EM | Admit: 2021-03-26 | Discharge: 2021-03-26 | Disposition: A | Discharge status: Home / Self Care | Payer: BC Managed Care – PPO | Attending: Emergency Medicine | Admitting: Emergency Medicine

## 2021-03-26 DIAGNOSIS — J101 Influenza due to other identified influenza virus with other respiratory manifestations: Secondary | ICD-10-CM | POA: Diagnosis not present

## 2021-03-26 DIAGNOSIS — R509 Fever, unspecified: Secondary | ICD-10-CM

## 2021-03-26 DIAGNOSIS — J989 Respiratory disorder, unspecified: Secondary | ICD-10-CM

## 2021-03-26 DIAGNOSIS — Z20822 Contact with and (suspected) exposure to covid-19: Secondary | ICD-10-CM | POA: Diagnosis not present

## 2021-03-26 LAB — POCT INFLUENZA A/B
Influenza A, POC: POSITIVE — AB
Influenza B, POC: NEGATIVE

## 2021-03-26 MED ORDER — FLUTICASONE PROPIONATE 50 MCG/ACT NA SUSP
2.0000 | Freq: Every day | NASAL | 0 refills | Status: DC
Start: 2021-03-26 — End: 2023-06-25

## 2021-03-26 MED ORDER — ALBUTEROL SULFATE HFA 108 (90 BASE) MCG/ACT IN AERS: 1.0000 | INHALATION_SPRAY | RESPIRATORY_TRACT | 0 refills | Status: DC | PRN

## 2021-03-26 MED ORDER — OSELTAMIVIR PHOSPHATE 75 MG PO CAPS: 75.0000 mg | ORAL_CAPSULE | Freq: Two times a day (BID) | ORAL | 0 refills | Status: DC

## 2021-03-26 MED ORDER — AEROCHAMBER PLUS MISC: 2 refills | Status: DC

## 2021-03-26 MED ORDER — BENZONATATE 200 MG PO CAPS: 200.0000 mg | ORAL_CAPSULE | Freq: Three times a day (TID) | ORAL | 0 refills | Status: DC | PRN

## 2021-03-26 MED ORDER — IBUPROFEN 600 MG PO TABS: 600.0000 mg | ORAL_TABLET | Freq: Four times a day (QID) | ORAL | 0 refills | Status: DC | PRN

## 2021-03-26 NOTE — Discharge Instructions (Addendum)
600 mg of ibuprofen combined with 1000 mg of Tylenol together 3-4 times a day as needed for pain, Mucinex, Flonase, saline nasal irrigation with a Lloyd Huger Med rinse with distilled water as often as you want for nasal congestion and postnasal drip.  Tessalon for the cough.  2 puffs from your albuterol inhaler using your spacer every 4-6 hours as needed.  This will help open up your lungs.  Finish the Tamiflu, even if you feel better.

## 2021-03-26 NOTE — ED Triage Notes (Signed)
Pt reports his son was sick with a cough and fever now patient is currently having a cough with congestion. Started: today

## 2021-03-26 NOTE — ED Provider Notes (Signed)
HPI  SUBJECTIVE:  Ian Gonzalez is a 35 y.o. male who presents with malaise, nasal congestion, cough, fatigue, fevers T-max 104, body aches, headaches, clear/green rhinorrhea, sinus pain and pressure, postnasal drip, sore throat, wheezing, shortness of breath and nausea starting today.  His son currently has a similar illness with fevers and URI symptoms.  He had the second dose of the COVID-vaccine.  He also has gotten this years flu vaccine.  No known COVID or flu exposure.  No antibiotics in the past month.  He tried a cold and flu cough syrup with improvement in his symptoms.  He has also tried Coricidin high blood pressure and increasing fluids.  No aggravating factors.  Symptoms are better with the OTC cough syrup.  He has a past medical history of BMI above 30, smoking, PSVT.  No formal diagnosis of hypertension.  States that he does not take any medications for it.  No history of diabetes, coronary artery disease.  ZOX:WRUEA, Earlie Lou, MD   Past Medical History:  Diagnosis Date   Bipolar 1 disorder (HCC)    Depression    Sleep disorder     Past Surgical History:  Procedure Laterality Date   CHOLECYSTECTOMY N/A 03/18/2015   Procedure: LAPAROSCOPIC CHOLECYSTECTOMY;  Surgeon: Franky Macho, MD;  Location: [facility];  Service: General;  Laterality: N/A;   NO PAST SURGERIES      Family History  Problem Relation Age of Onset   Heart failure Maternal Grandmother     Social History   Tobacco Use   Smoking status: Every Day    Packs/day: 1.00    Years: 10.00    Pack years: 10.00    Types: Cigarettes   Smokeless tobacco: Never  Vaping Use   Vaping Use: Former  Substance Use Topics   Alcohol use: Yes    Comment: Occassionally    Drug use: No    No current facility-administered medications for this encounter.  Current Outpatient Medications:    albuterol (VENTOLIN HFA) 108 (90 Base) MCG/ACT inhaler, Inhale 1-2 puffs into the lungs every 4 (four) hours as needed for wheezing or  shortness of breath., Disp: 1 each, Rfl: 0   benzonatate (TESSALON) 200 MG capsule, Take 1 capsule (200 mg total) by mouth 3 (three) times daily as needed for cough., Disp: 30 capsule, Rfl: 0   fluticasone (FLONASE) 50 MCG/ACT nasal spray, Place 2 sprays into both nostrils daily., Disp: 16 g, Rfl: 0   ibuprofen (ADVIL) 600 MG tablet, Take 1 tablet (600 mg total) by mouth every 6 (six) hours as needed., Disp: 30 tablet, Rfl: 0   oseltamivir (TAMIFLU) 75 MG capsule, Take 1 capsule (75 mg total) by mouth 2 (two) times daily. X 5 days, Disp: 10 capsule, Rfl: 0   Spacer/Aero-Holding Chambers (AEROCHAMBER PLUS) inhaler, Use with inhaler, Disp: 1 each, Rfl: 2   amLODipine (NORVASC) 5 MG tablet, Take 1 tablet (5 mg total) by mouth daily., Disp: 30 tablet, Rfl: 1   metoprolol succinate (TOPROL-XL) 25 MG 24 hr tablet, Take 1 tablet (25 mg total) by mouth daily., Disp: 30 tablet, Rfl: 1   nicotine (NICODERM CQ - DOSED IN MG/24 HOURS) 21 mg/24hr patch, Place 1 patch (21 mg total) onto the skin daily., Disp: 28 patch, Rfl: 0   pantoprazole (PROTONIX) 40 MG tablet, Take 1 tablet (40 mg total) by mouth daily., Disp: 30 tablet, Rfl: 1   QUEtiapine (SEROQUEL) 100 MG tablet, Take 100 mg by mouth at bedtime., Disp: , Rfl:  No Known Allergies   ROS  As noted in HPI.   Physical Exam  BP 135/80 (BP Location: Right Arm)   Pulse (!) 106   Temp 98.9 F (37.2 C) (Oral)   Resp 20   SpO2 95%   Constitutional: Well developed, well nourished, no acute distress Eyes:  EOMI, conjunctiva normal bilaterally HENT: Normocephalic, atraumatic,mucus membranes moist.  Mucoid nasal congestion.  Normal turbinates.  Positive maxillary sinus tenderness.  Normal tonsils without exudates, uvula midline.  Positive cobblestoning and postnasal drip. Respiratory: Normal inspiratory effort, lungs clear bilaterally, good air movement Cardiovascular: Mild regular tachycardia, no murmurs rubs or gallops GI: nondistended skin: No rash,  skin intact Musculoskeletal: no deformities Neurologic: Alert & oriented x 3, no focal neuro deficits Psychiatric: Speech and behavior appropriate   ED Course   Medications - No data to display  Orders Placed This Encounter  Procedures   Covid-19, Flu A+B ([facility])    Standing Status:   Standing    Number of Occurrences:   1   POCT Influenza A/B    Standing Status:   Standing    Number of Occurrences:   1    Results for orders placed or performed during the hospital encounter of 03/26/21 (from the past 24 hour(s))  POCT Influenza A/B     Status: Abnormal   Collection Time: 03/26/21  3:41 PM  Result Value Ref Range   Influenza A, POC Positive (A) Negative   Influenza B, POC Negative Negative   No results found.  ED Clinical Impression  1. Influenza A   2. Respiratory illness with fever   3. Encounter for laboratory testing for COVID-19 virus      ED Assessment/Plan  Patient with a viral respiratory illness with fever.  Will check rapid flu.  If positive, will send home with Tamiflu.  If negative, will send off a formal COVID/flu.  He will be a candidate for Molnupiravir based on BMI.  We will contact patient at [phone] if COVID is positive.  In the meantime, home with Tylenol/ibuprofen, Mucinex, Flonase, saline nasal irrigation, Tessalon, and albuterol inhaler with a spacer because he is a smoker and is reporting wheezing, shortness of breath.  Influenza A positive.  Adding Tamiflu to medication regimen above.  Still sending COVID.  MDM, treatment plan, and plan for follow-up with patient. Discussed sn/sx that should prompt return to the ED. patient agrees with plan.   Meds ordered this encounter  Medications   benzonatate (TESSALON) 200 MG capsule    Sig: Take 1 capsule (200 mg total) by mouth 3 (three) times daily as needed for cough.    Dispense:  30 capsule    Refill:  0   fluticasone (FLONASE) 50 MCG/ACT nasal spray    Sig: Place 2 sprays into both  nostrils daily.    Dispense:  16 g    Refill:  0   albuterol (VENTOLIN HFA) 108 (90 Base) MCG/ACT inhaler    Sig: Inhale 1-2 puffs into the lungs every 4 (four) hours as needed for wheezing or shortness of breath.    Dispense:  1 each    Refill:  0   Spacer/Aero-Holding Chambers (AEROCHAMBER PLUS) inhaler    Sig: Use with inhaler    Dispense:  1 each    Refill:  2    Please educate patient on use   ibuprofen (ADVIL) 600 MG tablet    Sig: Take 1 tablet (600 mg total) by mouth every 6 (six) hours as  needed.    Dispense:  30 tablet    Refill:  0   oseltamivir (TAMIFLU) 75 MG capsule    Sig: Take 1 capsule (75 mg total) by mouth 2 (two) times daily. X 5 days    Dispense:  10 capsule    Refill:  0      *This clinic note was created using Scientist, clinical (histocompatibility and immunogenetics). Therefore, there may be occasional mistakes despite careful proofreading.  ?    Domenick Gong, MD 03/27/21 [phone]

## 2021-04-16 ENCOUNTER — Ambulatory Visit
Admission: EM | Admit: 2021-04-16 | Discharge: 2021-04-16 | Disposition: A | Discharge status: Home / Self Care | Payer: BC Managed Care – PPO | Attending: Family Medicine | Admitting: Family Medicine

## 2021-04-16 ENCOUNTER — Other Ambulatory Visit: Payer: Self-pay

## 2021-04-16 DIAGNOSIS — K047 Periapical abscess without sinus: Secondary | ICD-10-CM

## 2021-04-16 MED ORDER — LIDOCAINE VISCOUS HCL 2 % MT SOLN: 5.0000 mL | OROMUCOSAL | 0 refills | Status: DC | PRN

## 2021-04-16 MED ORDER — AMOXICILLIN-POT CLAVULANATE 875-125 MG PO TABS: 1.0000 | ORAL_TABLET | Freq: Two times a day (BID) | ORAL | 0 refills | Status: DC

## 2021-04-16 NOTE — ED Provider Notes (Signed)
[facility]    CSN: [id number] Arrival date & time: 04/16/21  1108      History   Chief Complaint No chief complaint on file.   HPI Ian Gonzalez is a 35 y.o. male.   Patient presenting today with progressively worsening right posterior molar dental pain for the past 4 to 5 days.  He states there is a broken molar back there and he thinks it may be getting infected.  He denies fever, chills, dysphagia, severe headaches.  Has not yet been able to get in with his dentist.  Has been using Orajel and over-the-counter pain relievers with minimal relief.   Past Medical History:  Diagnosis Date   Bipolar 1 disorder (HCC)    Depression    Sleep disorder     Patient Active Problem List   Diagnosis Date Noted   PSVT (paroxysmal supraventricular tachycardia) (HCC) 02/08/2021   Optic neuritis 02/06/2021   Leukocytosis 02/06/2021   Obesity (BMI 30-39.9) 02/06/2021   Tobacco use 02/06/2021    Past Surgical History:  Procedure Laterality Date   CHOLECYSTECTOMY N/A 03/18/2015   Procedure: LAPAROSCOPIC CHOLECYSTECTOMY;  Surgeon: Franky Macho, MD;  Location: [facility];  Service: General;  Laterality: N/A;   NO PAST SURGERIES         Home Medications    Prior to Admission medications   Medication Sig Start Date End Date Taking? Authorizing Provider  amoxicillin-clavulanate (AUGMENTIN) 875-125 MG tablet Take 1 tablet by mouth every 12 (twelve) hours. 04/16/21  Yes Particia Nearing, PA-C  lidocaine (XYLOCAINE) 2 % solution Use as directed 5 mLs in the mouth or throat as needed for mouth pain. 04/16/21  Yes Particia Nearing, PA-C  albuterol (VENTOLIN HFA) 108 (90 Base) MCG/ACT inhaler Inhale 1-2 puffs into the lungs every 4 (four) hours as needed for wheezing or shortness of breath. 03/26/21   Domenick Gong, MD  amLODipine (NORVASC) 5 MG tablet Take 1 tablet (5 mg total) by mouth daily. 02/09/21   Rodolph Bong, MD  benzonatate (TESSALON) 200 MG capsule Take  1 capsule (200 mg total) by mouth 3 (three) times daily as needed for cough. 03/26/21   Domenick Gong, MD  fluticasone (FLONASE) 50 MCG/ACT nasal spray Place 2 sprays into both nostrils daily. 03/26/21   Domenick Gong, MD  ibuprofen (ADVIL) 600 MG tablet Take 1 tablet (600 mg total) by mouth every 6 (six) hours as needed. 03/26/21   Domenick Gong, MD  metoprolol succinate (TOPROL-XL) 25 MG 24 hr tablet Take 1 tablet (25 mg total) by mouth daily. 02/09/21   Rodolph Bong, MD  nicotine (NICODERM CQ - DOSED IN MG/24 HOURS) 21 mg/24hr patch Place 1 patch (21 mg total) onto the skin daily. 02/09/21   Rodolph Bong, MD  oseltamivir (TAMIFLU) 75 MG capsule Take 1 capsule (75 mg total) by mouth 2 (two) times daily. X 5 days 03/26/21   Domenick Gong, MD  pantoprazole (PROTONIX) 40 MG tablet Take 1 tablet (40 mg total) by mouth daily. 02/08/21   Rodolph Bong, MD  QUEtiapine (SEROQUEL) 100 MG tablet Take 100 mg by mouth at bedtime.    [provider]  Spacer/Aero-Holding Chambers (AEROCHAMBER PLUS) inhaler Use with inhaler 03/26/21   Domenick Gong, MD    Family History Family History  Problem Relation Age of Onset   Heart failure Maternal Grandmother     Social History Social History   Tobacco Use   Smoking status: Every Day  Packs/day: 1.00    Years: 10.00    Pack years: 10.00    Types: Cigarettes   Smokeless tobacco: Never  Vaping Use   Vaping Use: Some days  Substance Use Topics   Alcohol use: Yes    Comment: Occassionally    Drug use: No     Allergies   Patient has no known allergies.   Review of Systems Review of Systems Per HPI  Physical Exam Triage Vital Signs ED Triage Vitals  Enc Vitals Group     BP 04/16/21 1409 (!) 144/76     Pulse Rate 04/16/21 1409 79     Resp 04/16/21 1409 18     Temp 04/16/21 1409 98.8 F (37.1 C)     Temp Source 04/16/21 1409 Oral     SpO2 04/16/21 1409 95 %     Weight --      Height --      Head  Circumference --      Peak Flow --      Pain Score 04/16/21 1407 10     Pain Loc --      Pain Edu? --      Excl. in GC? --    No data found.  Updated Vital Signs BP (!) 144/76 (BP Location: Right Arm)   Pulse 79   Temp 98.8 F (37.1 C) (Oral)   Resp 18   SpO2 95%   Visual Acuity Right Eye Distance:   Left Eye Distance:   Bilateral Distance:    Right Eye Near:   Left Eye Near:    Bilateral Near:     Physical Exam Vitals and nursing note reviewed.  Constitutional:      Appearance: Normal appearance.  HENT:     Head: Atraumatic.     Mouth/Throat:     Mouth: Mucous membranes are moist.     Comments: Broken posterior right molar with significant decay, gingival erythema and edema.  No obvious abscess on exam Eyes:     Extraocular Movements: Extraocular movements intact.     Conjunctiva/sclera: Conjunctivae normal.  Cardiovascular:     Rate and Rhythm: Normal rate and regular rhythm.  Pulmonary:     Effort: Pulmonary effort is normal.     Breath sounds: Normal breath sounds.  Musculoskeletal:        General: Normal range of motion.     Cervical back: Normal range of motion and neck supple.  Skin:    General: Skin is warm and dry.  Neurological:     General: No focal deficit present.     Mental Status: He is oriented to person, place, and time.  Psychiatric:        Mood and Affect: Mood normal.        Thought Content: Thought content normal.        Judgment: Judgment normal.     UC Treatments / Results  Labs (all labs ordered are listed, but only abnormal results are displayed) Labs Reviewed - No data to display  EKG   Radiology No results found.  Procedures Procedures (including critical care time)  Medications Ordered in UC Medications - No data to display  Initial Impression / Assessment and Plan / UC Course  I have reviewed the triage vital signs and the nursing notes.  Pertinent labs & imaging results that were available during my care of  the patient were reviewed by me and considered in my medical decision making (see chart for details).  Treat with Augmentin, viscous lidocaine, salt water gargles, close dental follow-up.  Follow-up for worsening symptoms.  Final Clinical Impressions(s) / UC Diagnoses   Final diagnoses:  Dental infection   Discharge Instructions   None    ED Prescriptions     Medication Sig Dispense Auth. Provider   amoxicillin-clavulanate (AUGMENTIN) 875-125 MG tablet Take 1 tablet by mouth every 12 (twelve) hours. 14 tablet Particia Nearing, [place]   lidocaine (XYLOCAINE) 2 % solution Use as directed 5 mLs in the mouth or throat as needed for mouth pain. 100 mL Particia Nearing, [place]      PDMP not reviewed this encounter.   Particia Nearing, [place] 04/16/21 1441

## 2021-04-16 NOTE — ED Triage Notes (Signed)
Patient states 3 days ago a piece of his tooth ( back right bottom) broke off and he thinks its about to abscess. Taking tylenol, ibuprofen and Oragel at 9 pm last night.

## 2021-05-15 ENCOUNTER — Other Ambulatory Visit: Payer: Self-pay

## 2021-05-15 ENCOUNTER — Ambulatory Visit: Payer: BC Managed Care – PPO | Admitting: Internal Medicine

## 2021-05-15 ENCOUNTER — Encounter: Payer: Self-pay | Admitting: Internal Medicine

## 2021-05-15 VITALS — BP 138/88 | HR 98 | Resp 20 | Ht 74.0 in | Wt 343.1 lb

## 2021-05-15 DIAGNOSIS — E669 Obesity, unspecified: Secondary | ICD-10-CM

## 2021-05-15 DIAGNOSIS — I471 Supraventricular tachycardia: Secondary | ICD-10-CM | POA: Diagnosis not present

## 2021-05-15 DIAGNOSIS — Z1159 Encounter for screening for other viral diseases: Secondary | ICD-10-CM

## 2021-05-15 DIAGNOSIS — K219 Gastro-esophageal reflux disease without esophagitis: Secondary | ICD-10-CM

## 2021-05-15 DIAGNOSIS — Z8669 Personal history of other diseases of the nervous system and sense organs: Secondary | ICD-10-CM | POA: Diagnosis not present

## 2021-05-15 DIAGNOSIS — Z23 Encounter for immunization: Secondary | ICD-10-CM | POA: Diagnosis not present

## 2021-05-15 DIAGNOSIS — E0590 Thyrotoxicosis, unspecified without thyrotoxic crisis or storm: Secondary | ICD-10-CM

## 2021-05-15 DIAGNOSIS — Z720 Tobacco use: Secondary | ICD-10-CM

## 2021-05-15 DIAGNOSIS — G4700 Insomnia, unspecified: Secondary | ICD-10-CM

## 2021-05-15 DIAGNOSIS — E559 Vitamin D deficiency, unspecified: Secondary | ICD-10-CM | POA: Diagnosis not present

## 2021-05-15 DIAGNOSIS — Z131 Encounter for screening for diabetes mellitus: Secondary | ICD-10-CM

## 2021-05-15 MED ORDER — QUETIAPINE FUMARATE 100 MG PO TABS: 100.0000 mg | ORAL_TABLET | Freq: Every day | ORAL | 3 refills | Status: DC

## 2021-05-15 MED ORDER — OMEPRAZOLE 20 MG PO CPDR: 20.0000 mg | DELAYED_RELEASE_CAPSULE | Freq: Every day | ORAL | 0 refills | Status: DC

## 2021-05-15 MED ORDER — PREDNISONE 20 MG PO TABS: ORAL_TABLET | ORAL | 0 refills | Status: AC

## 2021-05-15 NOTE — Patient Instructions (Signed)
Please start taking Prednisone as prescribed.  You are being referred to Neurology.  Please start taking Seroquel for insomnia.  Please maintain simple sleep hygiene. - Maintain dark and non-noisy environment in the bedroom. - Please use the bedroom for sleep and sexual activity only. - Do not use electronic devices in the bedroom. - Please take dinner at least 2 hours before bedtime. - Please avoid caffeinated products in the evening, including coffee, soft drinks. - Please try to maintain the regular sleep-wake cycle - Go to bed and wake up at the same time.

## 2021-05-16 LAB — CMP14+EGFR
ALT: 19 IU/L (ref 0–44)
AST: 19 IU/L (ref 0–40)
Albumin/Globulin Ratio: 1.5 (ref 1.2–2.2)
Albumin: 4.4 g/dL (ref 4.0–5.0)
Alkaline Phosphatase: 86 IU/L (ref 44–121)
BUN/Creatinine Ratio: 12 (ref 9–20)
BUN: 9 mg/dL (ref 6–20)
Bilirubin Total: 0.5 mg/dL (ref 0.0–1.2)
CO2: 23 mmol/L (ref 20–29)
Calcium: 9.9 mg/dL (ref 8.7–10.2)
Chloride: 100 mmol/L (ref 96–106)
Creatinine, Ser: 0.76 mg/dL (ref 0.76–1.27)
Globulin, Total: 2.9 g/dL (ref 1.5–4.5)
Glucose: 140 mg/dL — ABNORMAL HIGH (ref 70–99)
Potassium: 4.9 mmol/L (ref 3.5–5.2)
Sodium: 137 mmol/L (ref 134–144)
Total Protein: 7.3 g/dL (ref 6.0–8.5)
eGFR: 122 mL/min/{1.73_m2} (ref >59)

## 2021-05-16 LAB — HEMOGLOBIN A1C
Est. average glucose Bld gHb Est-mCnc: 103 mg/dL
Hgb A1c MFr Bld: 5.2 % (ref 4.8–5.6)

## 2021-05-16 LAB — CBC WITH DIFFERENTIAL/PLATELET
Basophils Absolute: 0.1 10*3/uL (ref 0.0–0.2)
Basos: 1 %
EOS (ABSOLUTE): 0.3 10*3/uL (ref 0.0–0.4)
Eos: 3 %
Hematocrit: 47.4 % (ref 37.5–51.0)
Hemoglobin: 15 g/dL (ref 13.0–17.7)
Immature Grans (Abs): 0 10*3/uL (ref 0.0–0.1)
Immature Granulocytes: 0 %
Lymphocytes Absolute: 1.8 10*3/uL (ref 0.7–3.1)
Lymphs: 17 %
MCH: 25.8 pg — ABNORMAL LOW (ref 26.6–33.0)
MCHC: 31.6 g/dL (ref 31.5–35.7)
MCV: 82 fL (ref 79–97)
Monocytes Absolute: 0.4 10*3/uL (ref 0.1–0.9)
Monocytes: 4 %
Neutrophils Absolute: 8.2 10*3/uL — ABNORMAL HIGH (ref 1.4–7.0)
Neutrophils: 75 %
Platelets: 299 10*3/uL (ref 150–450)
RBC: 5.81 x10E6/uL — ABNORMAL HIGH (ref 4.14–5.80)
RDW: 12.6 % (ref 11.6–15.4)
WBC: 10.8 10*3/uL (ref 3.4–10.8)

## 2021-05-16 LAB — LIPID PANEL
Chol/HDL Ratio: 4.7 ratio (ref 0.0–5.0)
Cholesterol, Total: 200 mg/dL — ABNORMAL HIGH (ref 100–199)
HDL: 43 mg/dL (ref >39)
LDL Chol Calc (NIH): 118 mg/dL — ABNORMAL HIGH (ref 0–99)
Triglycerides: 221 mg/dL — ABNORMAL HIGH (ref 0–149)
VLDL Cholesterol Cal: 39 mg/dL (ref 5–40)

## 2021-05-16 LAB — SEDIMENTATION RATE: Sed Rate: 24 mm/hr — ABNORMAL HIGH (ref 0–15)

## 2021-05-16 LAB — TSH+FREE T4
Free T4: 1.19 ng/dL (ref 0.82–1.77)
TSH: 1.06 u[IU]/mL (ref 0.450–4.500)

## 2021-05-16 LAB — HEPATITIS C ANTIBODY: Hep C Virus Ab: 0.2 s/co ratio (ref 0.0–0.9)

## 2021-05-16 LAB — VITAMIN D 25 HYDROXY (VIT D DEFICIENCY, FRACTURES): Vit D, 25-Hydroxy: 20.1 ng/mL — ABNORMAL LOW (ref 30.0–100.0)

## 2021-05-19 ENCOUNTER — Encounter: Payer: Self-pay | Admitting: *Deleted

## 2021-05-19 DIAGNOSIS — K219 Gastro-esophageal reflux disease without esophagitis: Secondary | ICD-10-CM | POA: Insufficient documentation

## 2021-05-19 DIAGNOSIS — E0590 Thyrotoxicosis, unspecified without thyrotoxic crisis or storm: Secondary | ICD-10-CM | POA: Insufficient documentation

## 2021-05-19 DIAGNOSIS — G4700 Insomnia, unspecified: Secondary | ICD-10-CM | POA: Insufficient documentation

## 2021-05-19 NOTE — Assessment & Plan Note (Signed)
Started omeprazole since he will be taking Prednisone for next 3 weeks

## 2021-05-19 NOTE — Assessment & Plan Note (Signed)
Last TSH was low in the hospital. Check TSH

## 2021-05-19 NOTE — Progress Notes (Signed)
New Patient Office Visit  Subjective:  Patient ID: Ian Gonzalez, male    DOB: February 04, 1986  Age: 35 y.o. MRN: [id number]  CC:  Chief Complaint  Patient presents with   New Patient (Initial Visit)    New patient was in ER few months ago for migraine and blurred vision was seeing dr Cindie Laroche also needs medication refills     HPI Ian Gonzalez is a 35 y.o. male with past medical history of optic neuritis, anxiety with insomnia, tobacco abuse and morbid obesity who presents for establishing care.  He was admitted in the hospital in 01/2021 for optic neuritis.  He was started on IV steroids, but did not get oral steroids after being discharged.  He still complains of blurred vision.  He has not seen neurology in the outpatient setting yet.  He denies any numbness or weakness of UE or LE.  His vision was improving while he was getting IV steroids in the hospital.  He complains of severe anxiety and insomnia.  Of note, he was on Seroquel for insomnia in the past, but he has been out of it.  He was told that it would be refilled from the hospital, but he was not able to get it from the pharmacy.  He currently denies any SI or HI.  He currently smokes about 4 to 5 cigarettes/day and has been trying to cut down.  He has had COVID vaccines.  He received flu vaccine in the office today.  Past Medical History:  Diagnosis Date   Bipolar 1 disorder ([place])    Depression    Leukocytosis 02/06/2021   Sleep disorder     Past Surgical History:  Procedure Laterality Date   CHOLECYSTECTOMY N/A 03/18/2015   Procedure: LAPAROSCOPIC CHOLECYSTECTOMY;  Surgeon: Aviva Signs, MD;  Location: [facility];  Service: General;  Laterality: N/A;   NO PAST SURGERIES      Family History  Problem Relation Age of Onset   Heart failure Maternal Grandmother     Social History   Socioeconomic History   Marital status: Single    Spouse name: Not on file   Number of children: Not on file   Years of education: Not  on file   Highest education level: Not on file  Occupational History   Not on file  Tobacco Use   Smoking status: Every Day    Packs/day: 1.00    Years: 10.00    Pack years: 10.00    Types: Cigarettes   Smokeless tobacco: Never  Vaping Use   Vaping Use: Some days  Substance and Sexual Activity   Alcohol use: Yes    Comment: Occassionally    Drug use: No   Sexual activity: Yes    Birth control/protection: None  Other Topics Concern   Not on file  Social History Narrative   Not on file   Social Determinants of Health   Financial Resource Strain: Not on file  Food Insecurity: Not on file  Transportation Needs: Not on file  Physical Activity: Not on file  Stress: Not on file  Social Connections: Not on file  Intimate Partner Violence: Not on file    ROS Review of Systems  Constitutional:  Negative for chills and fever.  HENT:  Negative for congestion and sore throat.   Eyes:  Positive for visual disturbance. Negative for pain and discharge.  Respiratory:  Negative for cough and shortness of breath.   Cardiovascular:  Negative for chest pain and palpitations.  Gastrointestinal:  Negative for diarrhea, nausea and vomiting.  Endocrine: Negative for polydipsia and polyuria.  Genitourinary:  Negative for dysuria and hematuria.  Musculoskeletal:  Negative for neck pain and neck stiffness.  Skin:  Negative for rash.  Neurological:  Negative for dizziness, weakness, numbness and headaches.  Psychiatric/Behavioral:  Positive for sleep disturbance. Negative for agitation, behavioral problems, confusion and suicidal ideas. The patient is nervous/anxious.    Objective:   Today's Vitals: BP 138/88 (BP Location: Left Arm, Patient Position: Sitting, Cuff Size: Normal)   Pulse 98   Resp 20   Ht _0  (1.88 m)   Wt (!) 343 lb 1.9 oz (155.6 kg)   SpO2 97%   BMI 44.05 kg/m   Physical Exam Vitals reviewed.  Constitutional:      General: He is not in acute distress.     Appearance: He is obese. He is not diaphoretic.  HENT:     Head: Normocephalic and atraumatic.     Nose: Nose normal.     Mouth/Throat:     Mouth: Mucous membranes are moist.  Eyes:     General: No scleral icterus.    Extraocular Movements: Extraocular movements intact.  Cardiovascular:     Rate and Rhythm: Normal rate and regular rhythm.     Pulses: Normal pulses.     Heart sounds: Normal heart sounds. No murmur heard. Pulmonary:     Breath sounds: Normal breath sounds. No wheezing or rales.  Abdominal:     Palpations: Abdomen is soft.     Tenderness: There is no abdominal tenderness.  Musculoskeletal:     Cervical back: Neck supple. No tenderness.     Right lower leg: No edema.     Left lower leg: No edema.  Skin:    General: Skin is warm.     Findings: No rash.  Neurological:     General: No focal deficit present.     Mental Status: He is alert and oriented to person, place, and time.     Sensory: No sensory deficit.     Motor: No weakness.  Psychiatric:        Mood and Affect: Mood normal.        Behavior: Behavior normal.    Assessment & Plan:   Problem List Items Addressed This Visit       Cardiovascular and Mediastinum   PSVT (paroxysmal supraventricular tachycardia) ([place])    During hospitalization, attributes it to multiple nicotine patches at that time Was placed on metoprolol, does not take it currently Denies any dyspnea, dizziness or palpitations        Digestive   Gastroesophageal reflux disease    Started omeprazole since he will be taking Prednisone for next 3 weeks      Relevant Medications   omeprazole (PRILOSEC) 20 MG capsule   Other Relevant Orders   Lipid panel (Completed)     Endocrine   Subclinical hyperthyroidism    Last TSH was low in the hospital. Check TSH      Relevant Orders   TSH + free T4 (Completed)   Lipid panel (Completed)     Other   History of optic neuritis - Primary    Was hospitalized for optic neuritis, was  given IV steroids Did not get oral steroids later Still has blurry vision Check ESR Referred to neurology Started prednisone taper      Relevant Medications   predniSONE (DELTASONE) 20 MG tablet   Other Relevant Orders   Ambulatory  referral to Neurology   CMP14+EGFR (Completed)   CBC with Differential/Platelet (Completed)   Sed Rate (ESR) (Completed)   Obesity (BMI 30-39.9)    Diet modification and moderate exercise advised      Relevant Orders   Hemoglobin A1c (Completed)   Tobacco abuse    Asked about quitting: confirms that he currently smokes cigarettes Advise to quit smoking: Educated about QUITTING to reduce the risk of cancer, cardio and cerebrovascular disease. Assess willingness: Unwilling to quit at this time, but is working on cutting back. Assist with counseling and pharmacotherapy: Counseled for 5 minutes and literature provided. Arrange for follow up: follow up in 3 months and continue to offer help.       Insomnia    Was on Seroquel in the past Currently complains of severe anxiety and insomnia, restarted Seroquel      Relevant Medications   QUEtiapine (SEROQUEL) 100 MG tablet   Other Visit Diagnoses     Vitamin D deficiency       Relevant Orders   VITAMIN D 25 Hydroxy (Vit-D Deficiency, Fractures) (Completed)   Lipid panel (Completed)   Screening for diabetes mellitus (DM)       Relevant Orders   Hemoglobin A1c (Completed)   Encounter for hepatitis C screening test for low risk patient       Relevant Orders   Hepatitis C Antibody (Completed)   Need for immunization against influenza       Relevant Orders   Flu Vaccine QUAD 56moIM (Fluarix, Fluzone & Alfiuria Quad PF) (Completed)       Outpatient Encounter Medications as of 05/15/2021  Medication Sig   fluticasone (FLONASE) 50 MCG/ACT nasal spray Place 2 sprays into both nostrils daily.   omeprazole (PRILOSEC) 20 MG capsule Take 1 capsule (20 mg total) by mouth daily.   predniSONE (DELTASONE)  20 MG tablet Take 3 tablets (60 mg total) by mouth daily with breakfast for 5 days, THEN 2 tablets (40 mg total) daily with breakfast for 5 days, THEN 1 tablet (20 mg total) daily with breakfast for 5 days, THEN 0.5 tablets (10 mg total) daily with breakfast for 6 days.   metoprolol succinate (TOPROL-XL) 25 MG 24 hr tablet Take 1 tablet (25 mg total) by mouth daily. (Patient not taking: Reported on 05/15/2021)   QUEtiapine (SEROQUEL) 100 MG tablet Take 1 tablet (100 mg total) by mouth at bedtime.   [DISCONTINUED] albuterol (VENTOLIN HFA) 108 (90 Base) MCG/ACT inhaler Inhale 1-2 puffs into the lungs every 4 (four) hours as needed for wheezing or shortness of breath. (Patient not taking: Reported on 05/15/2021)   [DISCONTINUED] amLODipine (NORVASC) 5 MG tablet Take 1 tablet (5 mg total) by mouth daily. (Patient not taking: Reported on 05/15/2021)   [DISCONTINUED] amoxicillin-clavulanate (AUGMENTIN) 875-125 MG tablet Take 1 tablet by mouth every 12 (twelve) hours. (Patient not taking: Reported on 05/15/2021)   [DISCONTINUED] benzonatate (TESSALON) 200 MG capsule Take 1 capsule (200 mg total) by mouth 3 (three) times daily as needed for cough. (Patient not taking: Reported on 05/15/2021)   [DISCONTINUED] ibuprofen (ADVIL) 600 MG tablet Take 1 tablet (600 mg total) by mouth every 6 (six) hours as needed. (Patient not taking: Reported on 05/15/2021)   [DISCONTINUED] lidocaine (XYLOCAINE) 2 % solution Use as directed 5 mLs in the mouth or throat as needed for mouth pain. (Patient not taking: Reported on 05/15/2021)   [DISCONTINUED] nicotine (NICODERM CQ - DOSED IN MG/24 HOURS) 21 mg/24hr patch Place 1 patch (21 mg total)  onto the skin daily. (Patient not taking: Reported on 05/15/2021)   [DISCONTINUED] oseltamivir (TAMIFLU) 75 MG capsule Take 1 capsule (75 mg total) by mouth 2 (two) times daily. X 5 days (Patient not taking: Reported on 05/15/2021)   [DISCONTINUED] pantoprazole (PROTONIX) 40 MG tablet Take 1 tablet (40  mg total) by mouth daily. (Patient not taking: Reported on 05/15/2021)   [DISCONTINUED] QUEtiapine (SEROQUEL) 100 MG tablet Take 100 mg by mouth at bedtime. (Patient not taking: Reported on 05/15/2021)   [DISCONTINUED] Spacer/Aero-Holding Chambers (AEROCHAMBER PLUS) inhaler Use with inhaler (Patient not taking: Reported on 05/15/2021)   No facility-administered encounter medications on file as of 05/15/2021.    Follow-up: Return in about 6 months (around 11/13/2021) for Optic neuritis and PSVT.   Lindell Spar, MD

## 2021-05-19 NOTE — Assessment & Plan Note (Signed)
Was hospitalized for optic neuritis, was given IV steroids Did not get oral steroids later Still has blurry vision Check ESR Referred to neurology Started prednisone taper 

## 2021-05-19 NOTE — Assessment & Plan Note (Signed)
During hospitalization, attributes it to multiple nicotine patches at that time Was placed on metoprolol, does not take it currently Denies any dyspnea, dizziness or palpitations

## 2021-05-19 NOTE — Assessment & Plan Note (Signed)
Diet modification and moderate exercise advised. 

## 2021-05-19 NOTE — Assessment & Plan Note (Signed)
Was on Seroquel in the past Currently complains of severe anxiety and insomnia, restarted Seroquel

## 2021-05-19 NOTE — Assessment & Plan Note (Signed)
Asked about quitting: confirms that he currently smokes cigarettes Advise to quit smoking: Educated about QUITTING to reduce the risk of cancer, cardio and cerebrovascular disease. Assess willingness: Unwilling to quit at this time, but is working on cutting back. Assist with counseling and pharmacotherapy: Counseled for 5 minutes and literature provided. Arrange for follow up: follow up in 3 months and continue to offer help. 

## 2021-05-29 ENCOUNTER — Telehealth: Payer: Self-pay | Admitting: Internal Medicine

## 2021-05-29 NOTE — Telephone Encounter (Signed)
Pt notified with verbal understanding  °

## 2021-05-29 NOTE — Telephone Encounter (Signed)
Pt returning call

## 2021-05-29 NOTE — Telephone Encounter (Signed)
Pt advised of lab results with verbal understanding  

## 2021-05-29 NOTE — Telephone Encounter (Signed)
Patient called about labs, he has another question for the clinic nurse regarding hepatitis results.  He said it was confusing.  Please contact patient at [phone].

## 2021-06-26 ENCOUNTER — Other Ambulatory Visit: Payer: Self-pay

## 2021-06-26 ENCOUNTER — Ambulatory Visit: Payer: BC Managed Care – PPO | Admitting: Internal Medicine

## 2021-06-26 ENCOUNTER — Encounter: Payer: Self-pay | Admitting: Internal Medicine

## 2021-06-26 ENCOUNTER — Telehealth: Payer: Self-pay | Admitting: Internal Medicine

## 2021-06-26 VITALS — BP 138/84 | HR 92 | Resp 18 | Ht 75.0 in | Wt 356.1 lb

## 2021-06-26 DIAGNOSIS — K219 Gastro-esophageal reflux disease without esophagitis: Secondary | ICD-10-CM

## 2021-06-26 DIAGNOSIS — G4700 Insomnia, unspecified: Secondary | ICD-10-CM | POA: Diagnosis not present

## 2021-06-26 DIAGNOSIS — F17210 Nicotine dependence, cigarettes, uncomplicated: Secondary | ICD-10-CM

## 2021-06-26 DIAGNOSIS — Z720 Tobacco use: Secondary | ICD-10-CM | POA: Diagnosis not present

## 2021-06-26 DIAGNOSIS — Z8669 Personal history of other diseases of the nervous system and sense organs: Secondary | ICD-10-CM | POA: Diagnosis not present

## 2021-06-26 MED ORDER — OMEPRAZOLE 20 MG PO CPDR: 20.0000 mg | DELAYED_RELEASE_CAPSULE | Freq: Every day | ORAL | 0 refills | Status: DC

## 2021-06-26 MED ORDER — PREDNISONE 20 MG PO TABS: ORAL_TABLET | ORAL | 0 refills | Status: AC

## 2021-06-26 NOTE — Telephone Encounter (Signed)
Called [facility] left a msg that the pt needed to be seen in 2 weeks per Dr Dorcas Carrow both numbers I had on file

## 2021-06-26 NOTE — Assessment & Plan Note (Signed)
Smokes about 0.5 pack/day  Asked about quitting: confirms that he/she currently smokes cigarettes Advise to quit smoking: Educated about QUITTING to reduce the risk of cancer, cardio and cerebrovascular disease. Assess willingness: Unwilling to quit at this time, but is working on cutting back. Assist with counseling and pharmacotherapy: Counseled for 5 minutes and literature provided. Arrange for follow up: follow up in 3 months and continue to offer help. 

## 2021-06-26 NOTE — Patient Instructions (Signed)
Please start taking Prednisone as prescribed.  Please follow up with Neurology as scheduled.

## 2021-06-28 NOTE — Telephone Encounter (Signed)
Called again to [facility] to see if they were able to reach the pt. They have been unsuccessful.   Annabelle Harman will try and reach out again --

## 2021-06-30 NOTE — Assessment & Plan Note (Signed)
Was hospitalized for optic neuritis, was given IV steroids Did not get oral steroids later Still has blurry vision, was given prednisone taper, but did not take appropriately ESR was elevated in the last visit Referred to neurology -needs sooner appointment Restarted prednisone taper

## 2021-06-30 NOTE — Assessment & Plan Note (Signed)
Continue omeprazole since he will be taking Prednisone for next 4 weeks

## 2021-06-30 NOTE — Progress Notes (Signed)
Established Patient Office Visit  Subjective:  Patient ID: Ian Gonzalez, male    DOB: 1986/05/16  Age: 36 y.o. MRN: [id number]  CC:  Chief Complaint  Patient presents with   Follow-up    6 week follow up optic neuritis feels steroid is working however feels like it should be working faster than it is    HPI Ian Gonzalez is a 36 y.o. male with past medical history of  optic neuritis, anxiety with insomnia, tobacco abuse and morbid obesity who presents for f/u of his chronic medical conditions.  He still complains of blurred vision.  He started having improvement in his vision when he started taking prednisone, but it did not improve later.  Of note, he was given prednisone taper for 3 weeks only, and he states that he is still taking it 1 tablet daily after 6 weeks from the last visit.  He agrees that he might have confused the tapering dose.  He has not been able to see neurology yet.  He denies any new numbness, tingling or weakness of the UE or LE.  His sleep has improved with Seroquel now.  He denies any anhedonia, spells of anxiety, SI or HI currently.   Past Medical History:  Diagnosis Date   Bipolar 1 disorder ([place])    Depression    Leukocytosis 02/06/2021   Sleep disorder     Past Surgical History:  Procedure Laterality Date   CHOLECYSTECTOMY N/A 03/18/2015   Procedure: LAPAROSCOPIC CHOLECYSTECTOMY;  Surgeon: Aviva Signs, MD;  Location: [facility];  Service: General;  Laterality: N/A;   NO PAST SURGERIES      Family History  Problem Relation Age of Onset   Heart failure Maternal Grandmother     Social History   Socioeconomic History   Marital status: Single    Spouse name: Not on file   Number of children: Not on file   Years of education: Not on file   Highest education level: Not on file  Occupational History   Not on file  Tobacco Use   Smoking status: Every Day    Packs/day: 1.00    Years: 10.00    Pack years: 10.00    Types: Cigarettes   Smokeless  tobacco: Never  Vaping Use   Vaping Use: Some days  Substance and Sexual Activity   Alcohol use: Yes    Comment: Occassionally    Drug use: No   Sexual activity: Yes    Birth control/protection: None  Other Topics Concern   Not on file  Social History Narrative   Not on file   Social Determinants of Health   Financial Resource Strain: Not on file  Food Insecurity: Not on file  Transportation Needs: Not on file  Physical Activity: Not on file  Stress: Not on file  Social Connections: Not on file  Intimate Partner Violence: Not on file    Outpatient Medications Prior to Visit  Medication Sig Dispense Refill   fluticasone (FLONASE) 50 MCG/ACT nasal spray Place 2 sprays into both nostrils daily. 16 g 0   QUEtiapine (SEROQUEL) 100 MG tablet Take 1 tablet (100 mg total) by mouth at bedtime. 30 tablet 3   omeprazole (PRILOSEC) 20 MG capsule Take 1 capsule (20 mg total) by mouth daily. 30 capsule 0   metoprolol succinate (TOPROL-XL) 25 MG 24 hr tablet Take 1 tablet (25 mg total) by mouth daily. (Patient not taking: Reported on 05/15/2021) 30 tablet 1   No facility-administered medications  prior to visit.    No Known Allergies  ROS Review of Systems  Constitutional:  Negative for chills and fever.  HENT:  Negative for congestion and sore throat.   Eyes:  Positive for visual disturbance. Negative for pain and discharge.  Respiratory:  Negative for cough and shortness of breath.   Cardiovascular:  Negative for chest pain and palpitations.  Gastrointestinal:  Negative for diarrhea, nausea and vomiting.  Endocrine: Negative for polydipsia and polyuria.  Genitourinary:  Negative for dysuria and hematuria.  Musculoskeletal:  Negative for neck pain and neck stiffness.  Skin:  Negative for rash.  Neurological:  Negative for dizziness, weakness, numbness and headaches.  Psychiatric/Behavioral:  Negative for agitation, behavioral problems, confusion, sleep disturbance and suicidal  ideas. The patient is not nervous/anxious.      Objective:    Physical Exam Vitals reviewed.  Constitutional:      General: He is not in acute distress.    Appearance: He is obese. He is not diaphoretic.  HENT:     Head: Normocephalic and atraumatic.     Nose: Nose normal.     Mouth/Throat:     Mouth: Mucous membranes are moist.  Eyes:     General: No scleral icterus.    Extraocular Movements: Extraocular movements intact.  Cardiovascular:     Rate and Rhythm: Normal rate and regular rhythm.     Pulses: Normal pulses.     Heart sounds: Normal heart sounds. No murmur heard. Pulmonary:     Breath sounds: Normal breath sounds. No wheezing or rales.  Abdominal:     Palpations: Abdomen is soft.     Tenderness: There is no abdominal tenderness.  Musculoskeletal:     Cervical back: Neck supple. No tenderness.     Right lower leg: No edema.     Left lower leg: No edema.  Skin:    General: Skin is warm.     Findings: No rash.  Neurological:     General: No focal deficit present.     Mental Status: He is alert and oriented to person, place, and time.     Sensory: No sensory deficit.     Motor: No weakness.  Psychiatric:        Mood and Affect: Mood normal.        Behavior: Behavior normal.    BP 138/84 (BP Location: Right Arm, Patient Position: Sitting, Cuff Size: Normal)    Pulse 92    Resp 18    Ht _0  (1.905 m)    Wt (!) 356 lb 1.3 oz (161.5 kg)    SpO2 97%    BMI 44.51 kg/m  Wt Readings from Last 3 Encounters:  06/26/21 (!) 356 lb 1.3 oz (161.5 kg)  05/15/21 (!) 343 lb 1.9 oz (155.6 kg)  02/06/21 285 lb (129.3 kg)    Lab Results  Component Value Date   TSH 1.060 05/15/2021   Lab Results  Component Value Date   WBC 10.8 05/15/2021   HGB 15.0 05/15/2021   HCT 47.4 05/15/2021   MCV 82 05/15/2021   PLT 299 05/15/2021   Lab Results  Component Value Date   NA 137 05/15/2021   K 4.9 05/15/2021   CO2 23 05/15/2021   GLUCOSE 140 (H) 05/15/2021   BUN 9  05/15/2021   CREATININE 0.76 05/15/2021   BILITOT 0.5 05/15/2021   ALKPHOS 86 05/15/2021   AST 19 05/15/2021   ALT 19 05/15/2021   PROT 7.3 05/15/2021  ALBUMIN 4.4 05/15/2021   CALCIUM 9.9 05/15/2021   ANIONGAP 11 02/08/2021   EGFR 122 05/15/2021   Lab Results  Component Value Date   CHOL 200 (H) 05/15/2021   Lab Results  Component Value Date   HDL 43 05/15/2021   Lab Results  Component Value Date   LDLCALC 118 (H) 05/15/2021   Lab Results  Component Value Date   TRIG 221 (H) 05/15/2021   Lab Results  Component Value Date   CHOLHDL 4.7 05/15/2021   Lab Results  Component Value Date   HGBA1C 5.2 05/15/2021      Assessment & Plan:   Problem List Items Addressed This Visit       Digestive   Gastroesophageal reflux disease    Continue omeprazole since he will be taking Prednisone for next 4 weeks      Relevant Medications   omeprazole (PRILOSEC) 20 MG capsule     Other   History of optic neuritis - Primary    Was hospitalized for optic neuritis, was given IV steroids Did not get oral steroids later Still has blurry vision, was given prednisone taper, but did not take appropriately ESR was elevated in the last visit Referred to neurology -needs sooner appointment Restarted prednisone taper      Relevant Medications   predniSONE (DELTASONE) 20 MG tablet   Tobacco abuse    Smokes about 0.5 pack/day  Asked about quitting: confirms that he/she currently smokes cigarettes Advise to quit smoking: Educated about QUITTING to reduce the risk of cancer, cardio and cerebrovascular disease. Assess willingness: Unwilling to quit at this time, but is working on cutting back. Assist with counseling and pharmacotherapy: Counseled for 5 minutes and literature provided. Arrange for follow up: follow up in 3 months and continue to offer help.      Insomnia    Better with Seroquel now       Meds ordered this encounter  Medications   predniSONE (DELTASONE) 20  MG tablet    Sig: Take 3 tablets (60 mg total) by mouth daily with breakfast for 7 days, THEN 2 tablets (40 mg total) daily with breakfast for 7 days, THEN 1 tablet (20 mg total) daily with breakfast for 7 days, THEN 0.5 tablets (10 mg total) daily with breakfast for 8 days.    Dispense:  46 tablet    Refill:  0   omeprazole (PRILOSEC) 20 MG capsule    Sig: Take 1 capsule (20 mg total) by mouth daily.    Dispense:  30 capsule    Refill:  0    Follow-up: Return in about 2 months (around 08/24/2021).    Lindell Spar, MD

## 2021-06-30 NOTE — Assessment & Plan Note (Signed)
Better with Seroquel now 

## 2021-08-17 ENCOUNTER — Encounter: Payer: Self-pay | Admitting: Internal Medicine

## 2021-08-17 ENCOUNTER — Other Ambulatory Visit: Payer: Self-pay

## 2021-08-17 ENCOUNTER — Ambulatory Visit: Payer: BC Managed Care – PPO | Admitting: Internal Medicine

## 2021-08-17 VITALS — BP 132/86 | HR 104 | Resp 18 | Ht 75.0 in | Wt 372.6 lb

## 2021-08-17 DIAGNOSIS — Z0000 Encounter for general adult medical examination without abnormal findings: Secondary | ICD-10-CM

## 2021-08-17 DIAGNOSIS — Z720 Tobacco use: Secondary | ICD-10-CM

## 2021-08-17 DIAGNOSIS — Z0001 Encounter for general adult medical examination with abnormal findings: Secondary | ICD-10-CM

## 2021-08-17 DIAGNOSIS — G4700 Insomnia, unspecified: Secondary | ICD-10-CM

## 2021-08-17 DIAGNOSIS — Z8669 Personal history of other diseases of the nervous system and sense organs: Secondary | ICD-10-CM | POA: Diagnosis not present

## 2021-08-17 MED ORDER — QUETIAPINE FUMARATE 100 MG PO TABS: 100.0000 mg | ORAL_TABLET | Freq: Every day | ORAL | 5 refills | Status: DC

## 2021-08-17 NOTE — Patient Instructions (Signed)
Please contact [facility] at [phone] to confirm your appointment. ? ?Please continue to take medications as prescribed. ?

## 2021-08-18 ENCOUNTER — Encounter: Payer: Self-pay | Admitting: Internal Medicine

## 2021-08-18 NOTE — Progress Notes (Signed)
? ?Established Patient Office Visit ? ?Subjective:  ?Patient ID: Ian Gonzalez, male    DOB: [birth year]  Age: 36 y.o. MRN: [id number] ? ?CC:  ?Chief Complaint  ?Patient presents with  ? Follow-up  ?  2 month follow up pt needs refills on sleeping medication   ? ? ?HPI ?Ian Gonzalez is a 36 y.o. male with past medical history of optic neuritis, anxiety with insomnia, tobacco abuse and morbid obesity who presents for f/u of his chronic medical conditions. ? ?He is vision has improved now with steroids.  He has not been able to see neurology yet for history of optic neuritis.  He denies any numbness, tingling or weakness of the UE or LE currently.  He agrees to contact neurology office to schedule appointment. ? ?His sleep has improved with Seroquel now.  He denies any anhedonia, spells of anxiety, SI or HI currently. ? ? ? ? ?Past Medical History:  ?Diagnosis Date  ? Bipolar 1 disorder ([place])   ? Depression   ? Leukocytosis 02/06/2021  ? Sleep disorder   ? ? ?Past Surgical History:  ?Procedure Laterality Date  ? CHOLECYSTECTOMY N/A 03/18/2015  ? Procedure: LAPAROSCOPIC CHOLECYSTECTOMY;  Surgeon: Aviva Signs, MD;  Location: [facility];  Service: General;  Laterality: N/A;  ? NO PAST SURGERIES    ? ? ?Family History  ?Problem Relation Age of Onset  ? Heart failure Maternal Grandmother   ? ? ?Social History  ? ?Socioeconomic History  ? Marital status: Single  ?  Spouse name: Not on file  ? Number of children: Not on file  ? Years of education: Not on file  ? Highest education level: Not on file  ?Occupational History  ? Not on file  ?Tobacco Use  ? Smoking status: Every Day  ?  Packs/day: 1.00  ?  Years: 10.00  ?  Pack years: 10.00  ?  Types: Cigarettes  ? Smokeless tobacco: Never  ?Vaping Use  ? Vaping Use: Some days  ?Substance and Sexual Activity  ? Alcohol use: Yes  ?  Comment: Occassionally   ? Drug use: No  ? Sexual activity: Yes  ?  Birth control/protection: None  ?Other Topics Concern  ? Not on file  ?Social History  Narrative  ? Not on file  ? ?Social Determinants of Health  ? ?Financial Resource Strain: Not on file  ?Food Insecurity: Not on file  ?Transportation Needs: Not on file  ?Physical Activity: Not on file  ?Stress: Not on file  ?Social Connections: Not on file  ?Intimate Partner Violence: Not on file  ? ? ?Outpatient Medications Prior to Visit  ?Medication Sig Dispense Refill  ? fluticasone (FLONASE) 50 MCG/ACT nasal spray Place 2 sprays into both nostrils daily. 16 g 0  ? omeprazole (PRILOSEC) 20 MG capsule Take 1 capsule (20 mg total) by mouth daily. 30 capsule 0  ? QUEtiapine (SEROQUEL) 100 MG tablet Take 1 tablet (100 mg total) by mouth at bedtime. 30 tablet 3  ? ?No facility-administered medications prior to visit.  ? ? ?No Known Allergies ? ?ROS ?Review of Systems  ?Constitutional:  Negative for chills and fever.  ?HENT:  Negative for congestion and sore throat.   ?Eyes:  Negative for pain and discharge.  ?Respiratory:  Negative for cough and shortness of breath.   ?Cardiovascular:  Negative for chest pain and palpitations.  ?Gastrointestinal:  Negative for diarrhea, nausea and vomiting.  ?Endocrine: Negative for polydipsia and polyuria.  ?Genitourinary:  Negative for  dysuria and hematuria.  ?Musculoskeletal:  Negative for neck pain and neck stiffness.  ?Skin:  Negative for rash.  ?Neurological:  Negative for dizziness, weakness, numbness and headaches.  ?Psychiatric/Behavioral:  Negative for agitation, behavioral problems, confusion, sleep disturbance and suicidal ideas. The patient is not nervous/anxious.   ? ?  ?Objective:  ?  ?Physical Exam ?Vitals reviewed.  ?Constitutional:   ?   General: He is not in acute distress. ?   Appearance: He is obese. He is not diaphoretic.  ?HENT:  ?   Head: Normocephalic and atraumatic.  ?   Nose: Nose normal.  ?   Mouth/Throat:  ?   Mouth: Mucous membranes are moist.  ?Eyes:  ?   General: No scleral icterus. ?   Extraocular Movements: Extraocular movements intact.   ?Cardiovascular:  ?   Rate and Rhythm: Normal rate and regular rhythm.  ?   Pulses: Normal pulses.  ?   Heart sounds: Normal heart sounds. No murmur heard. ?Pulmonary:  ?   Breath sounds: Normal breath sounds. No wheezing or rales.  ?Musculoskeletal:  ?   Cervical back: Neck supple. No tenderness.  ?   Right lower leg: No edema.  ?   Left lower leg: No edema.  ?Skin: ?   General: Skin is warm.  ?   Findings: No rash.  ?Neurological:  ?   General: No focal deficit present.  ?   Mental Status: He is alert and oriented to person, place, and time.  ?   Sensory: No sensory deficit.  ?   Motor: No weakness.  ?Psychiatric:     ?   Mood and Affect: Mood normal.     ?   Behavior: Behavior normal.  ? ? ?BP 132/86 (BP Location: Right Arm, Patient Position: Sitting, Cuff Size: Normal)   Pulse (!) 104   Resp 18   Ht $R'6\' 3"'Ir$  (1.905 m)   Wt (!) 372 lb 9.6 oz (169 kg)   SpO2 96%   BMI 46.57 kg/m?  ?Wt Readings from Last 3 Encounters:  ?08/17/21 (!) 372 lb 9.6 oz (169 kg)  ?06/26/21 (!) 356 lb 1.3 oz (161.5 kg)  ?05/15/21 (!) 343 lb 1.9 oz (155.6 kg)  ? ? ?Lab Results  ?Component Value Date  ? TSH 1.060 05/15/2021  ? ?Lab Results  ?Component Value Date  ? WBC 10.8 05/15/2021  ? HGB 15.0 05/15/2021  ? HCT 47.4 05/15/2021  ? MCV 82 05/15/2021  ? PLT 299 05/15/2021  ? ?Lab Results  ?Component Value Date  ? NA 137 05/15/2021  ? K 4.9 05/15/2021  ? CO2 23 05/15/2021  ? GLUCOSE 140 (H) 05/15/2021  ? BUN 9 05/15/2021  ? CREATININE 0.76 05/15/2021  ? BILITOT 0.5 05/15/2021  ? ALKPHOS 86 05/15/2021  ? AST 19 05/15/2021  ? ALT 19 05/15/2021  ? PROT 7.3 05/15/2021  ? ALBUMIN 4.4 05/15/2021  ? CALCIUM 9.9 05/15/2021  ? ANIONGAP 11 02/08/2021  ? EGFR 122 05/15/2021  ? ?Lab Results  ?Component Value Date  ? CHOL 200 (H) 05/15/2021  ? ?Lab Results  ?Component Value Date  ? HDL 43 05/15/2021  ? ?Lab Results  ?Component Value Date  ? LDLCALC 118 (H) 05/15/2021  ? ?Lab Results  ?Component Value Date  ? TRIG 221 (H) 05/15/2021  ? ?Lab Results   ?Component Value Date  ? CHOLHDL 4.7 05/15/2021  ? ?Lab Results  ?Component Value Date  ? HGBA1C 5.2 05/15/2021  ? ? ?  ?Assessment & Plan:  ? ?  Problem List Items Addressed This Visit   ? ?  ? Other  ? History of optic neuritis - Primary  ?  Was hospitalized for optic neuritis, was given IV steroids ?Did not get oral steroids later ?Blurry vision improved now with oral prednisone ?ESR was elevated, will recheck later ?Referred to neurology -needs sooner appointment ?  ?  ? Relevant Orders  ? Sed Rate (ESR)  ? Tobacco abuse  ?  Smokes about 0.5 pack/day ? ?Asked about quitting: confirms that he/she currently smokes cigarettes ?Advise to quit smoking: Educated about QUITTING to reduce the risk of cancer, cardio and cerebrovascular disease. ?Assess willingness: Unwilling to quit at this time, but is working on cutting back. ?Assist with counseling and pharmacotherapy: Counseled for 5 minutes and literature provided. ?Arrange for follow up: follow up in 3 months and continue to offer help. ?  ?  ? Insomnia  ?  Better with Seroquel now, refilled ?  ?  ? Relevant Medications  ? QUEtiapine (SEROQUEL) 100 MG tablet  ? ?Meds ordered this encounter  ?Medications  ? QUEtiapine (SEROQUEL) 100 MG tablet  ?  Sig: Take 1 tablet (100 mg total) by mouth at bedtime.  ?  Dispense:  30 tablet  ?  Refill:  5  ? ? ?Follow-up: Return in about 6 months (around 02/17/2022) for Annual physical.  ? ? ?Lindell Spar, MD ?

## 2021-08-18 NOTE — Assessment & Plan Note (Signed)
Was hospitalized for optic neuritis, was given IV steroids ?Did not get oral steroids later ?Blurry vision improved now with oral prednisone ?ESR was elevated, will recheck later ?Referred to neurology -needs sooner appointment ?

## 2021-08-18 NOTE — Assessment & Plan Note (Signed)
Better with Seroquel now, refilled ?

## 2021-08-18 NOTE — Assessment & Plan Note (Signed)
Smokes about 0.5 pack/day  Asked about quitting: confirms that he/she currently smokes cigarettes Advise to quit smoking: Educated about QUITTING to reduce the risk of cancer, cardio and cerebrovascular disease. Assess willingness: Unwilling to quit at this time, but is working on cutting back. Assist with counseling and pharmacotherapy: Counseled for 5 minutes and literature provided. Arrange for follow up: follow up in 3 months and continue to offer help. 

## 2021-09-25 ENCOUNTER — Encounter: Payer: Self-pay | Admitting: Neurology

## 2021-10-24 ENCOUNTER — Ambulatory Visit
Admission: EM | Admit: 2021-10-24 | Discharge: 2021-10-24 | Disposition: A | Discharge status: Home / Self Care | Payer: BC Managed Care – PPO | Attending: Family Medicine | Admitting: Family Medicine

## 2021-10-24 ENCOUNTER — Other Ambulatory Visit: Payer: Self-pay

## 2021-10-24 ENCOUNTER — Encounter: Payer: Self-pay | Admitting: Emergency Medicine

## 2021-10-24 ENCOUNTER — Ambulatory Visit: Payer: BC Managed Care – PPO

## 2021-10-24 ENCOUNTER — Ambulatory Visit (INDEPENDENT_AMBULATORY_CARE_PROVIDER_SITE_OTHER): Payer: BC Managed Care – PPO

## 2021-10-24 DIAGNOSIS — M25512 Pain in left shoulder: Secondary | ICD-10-CM

## 2021-10-24 DIAGNOSIS — R0781 Pleurodynia: Secondary | ICD-10-CM

## 2021-10-24 MED ORDER — NAPROXEN 500 MG PO TABS: 500.0000 mg | ORAL_TABLET | Freq: Two times a day (BID) | ORAL | 0 refills | Status: DC | PRN

## 2021-10-24 MED ORDER — CYCLOBENZAPRINE HCL 10 MG PO TABS: 10.0000 mg | ORAL_TABLET | Freq: Three times a day (TID) | ORAL | 0 refills | Status: DC | PRN
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
## 2021-11-13 ENCOUNTER — Ambulatory Visit: Payer: BC Managed Care – PPO | Admitting: Internal Medicine

## 2021-12-13 ENCOUNTER — Ambulatory Visit
Admission: EM | Admit: 2021-12-13 | Discharge: 2021-12-13 | Disposition: A | Discharge status: Home / Self Care | Payer: BC Managed Care – PPO

## 2021-12-13 DIAGNOSIS — R0781 Pleurodynia: Secondary | ICD-10-CM

## 2021-12-13 MED ORDER — LIDOCAINE 5 % EX PTCH: 1.0000 | MEDICATED_PATCH | CUTANEOUS | 0 refills | Status: DC

## 2021-12-13 MED ORDER — TIZANIDINE HCL 2 MG PO CAPS: 2.0000 mg | ORAL_CAPSULE | Freq: Three times a day (TID) | ORAL | 0 refills | Status: DC | PRN
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
## 2022-02-06 NOTE — Progress Notes (Unsigned)
NEUROLOGY CONSULTATION NOTE  JOVANN LUSE MRN: [id number] DOB: 1985/07/31  Referring provider: Trena Platt, MD Primary care provider: Trena Platt, MD  Reason for consult:  history of optic neuritis  Assessment/Plan:   History of left optic neuritis.  He does have possible right Babinski raising possibility of another CNS lesion.  Will need further workup to determine if he now has diagnosis of multiple sclerosis Tobacco use disorder  Repeat MRI of brain, cervical and thoracic spine with and without contrast. If MRI nondiagnostic, will proceed with LP for CSF analysis - cell count, protein, glucose, gram stain and culture, oligoclonal bands, IgG index.  If MRI is unremarkable but CSF is abnormal, would start disease modifying therapy. Follow up after testing.  Further recommendations pending results. Smoking cessation     Subjective:  Ian Gonzalez is a 36 year old male with Bipolar 1 disorder, depression and sleep disorder who presents for history of optic neuritis.  History supplemented by hospital records and referring provider's note.  He was admitted to [facility] on 02/06/2021 with left sided headache and blurred vision in the left eye.  [place] when he developed a headache.  He went home but the next morning he couldn't see out of his left eye.  MRI of brain and orbits with and without contrast revealed normal brain but demonstrated contrast enhancement of left optic nerve consistent with optic neuritis.  MRI of cervical and thoracic spine with and without contrast was unremarkable.  Labs included sed rate 10, CRP 0.7, B12 349, negative RPR, TSH 0.308, free T4 0.88, homocysteine 7.3, and negative NMO-IgG antibody.  He was treated with 3 day course of Solu-Medr ol 1mg .with significant improvement.  He never followed up with neurology.  Denies residual symptoms.  No unilateral numbness, weakness or gait instability.  Since then, it has gradually  improved but still blurred.  He has not had a formal eye exam since then.  Denies neurologic symptoms prior to this event or since this event.  Family history:  maternal grandmother had Alzheimer's.  No family history of MS.       PAST MEDICAL HISTORY: Past Medical History:  Diagnosis Date   Bipolar 1 disorder (HCC)    Depression    Leukocytosis 02/06/2021   Sleep disorder     PAST SURGICAL HISTORY: Past Surgical History:  Procedure Laterality Date   CHOLECYSTECTOMY N/A 03/18/2015   Procedure: LAPAROSCOPIC CHOLECYSTECTOMY;  Surgeon: 05/18/2015, MD;  Location: [facility];  Service: General;  Laterality: N/A;   NO PAST SURGERIES      MEDICATIONS: Current Outpatient Medications on File Prior to Visit  Medication Sig Dispense Refill   acetaminophen (TYLENOL) 325 MG tablet Take 650 mg by mouth every 6 (six) hours as needed.     fluticasone (FLONASE) 50 MCG/ACT nasal spray Place 2 sprays into both nostrils daily. 16 g 0   ibuprofen (ADVIL) 200 MG tablet Take 200 mg by mouth every 6 (six) hours as needed.     lidocaine (LIDODERM) 5 % Place 1 patch onto the skin daily. Remove & Discard patch within 12 hours or as directed by MD 30 patch 0   naproxen (NAPROSYN) 500 MG tablet Take 1 tablet (500 mg total) by mouth 2 (two) times daily as needed. 30 tablet 0   omeprazole (PRILOSEC) 20 MG capsule Take 1 capsule (20 mg total) by mouth daily. 30 capsule 0   QUEtiapine (SEROQUEL) 100 MG tablet  Take 1 tablet (100 mg total) by mouth at bedtime. 30 tablet 5   tizanidine (ZANAFLEX) 2 MG capsule Take 1 capsule (2 mg total) by mouth 3 (three) times daily as needed for muscle spasms. Do not drink alcohol or drive while taking this medication.  May cause drowsiness. 15 capsule 0   No current facility-administered medications on file prior to visit.    ALLERGIES: No Known Allergies  FAMILY HISTORY: Family History  Problem Relation Age of Onset   Heart failure Maternal Grandmother     Objective:   Blood pressure (!) 151/90, pulse 92, height 6\' 3"  (1.905 m), weight (!) 379 lb 3.2 oz (172 kg), SpO2 93 %. General: No acute distress.  Patient appears well-groomed.   Head:  Normocephalic/atraumatic Eyes:  fundi examined but not visualized Neck: supple, no paraspinal tenderness, full range of motion Back: No paraspinal tenderness Heart: regular rate and rhythm Neurological Exam: Mental status: alert and oriented to person, place, and time, speech fluent and not dysarthric, language intact. Cranial nerves: CN I: not tested CN II: pupils equal, round and reactive, OS APD. visual fields intact but slightly blurred in left eye. CN III, IV, VI:  full range of motion, no nystagmus, no ptosis CN V: facial sensation intact. CN VII: upper and lower face symmetric CN VIII: hearing intact CN IX, X: gag intact, uvula midline CN XI: sternocleidomastoid and trapezius muscles intact CN XII: tongue midline Bulk & Tone: normal, no fasciculations. Motor:  muscle strength 5/5 throughout Sensation:  Pinprick, temperature and vibratory sensation intact. Deep Tendon Reflexes:  2+ throughout, right upgoing toe, downgoing on left Finger to nose testing:  Without dysmetria.   Heel to shin:  Without dysmetria.   Gait:  Normal station and stride.  Romberg negative.    Thank you for allowing me to take part in the care of this patient.  , DO  CC: Shon Millet, MD

## 2022-02-08 ENCOUNTER — Encounter: Payer: Self-pay | Admitting: Neurology

## 2022-02-08 ENCOUNTER — Ambulatory Visit: Payer: BC Managed Care – PPO | Admitting: Neurology

## 2022-02-08 VITALS — BP 151/90 | HR 92 | Ht 75.0 in | Wt 379.2 lb

## 2022-02-08 DIAGNOSIS — Z8669 Personal history of other diseases of the nervous system and sense organs: Secondary | ICD-10-CM

## 2022-02-08 DIAGNOSIS — F17200 Nicotine dependence, unspecified, uncomplicated: Secondary | ICD-10-CM | POA: Diagnosis not present

## 2022-02-08 NOTE — Patient Instructions (Addendum)
Repeat MRI of brain, cervical and thoracic spine with and without contrast Schedule for spinal tap testing CSF for cell count, glucose, protein, oligoclonal bands, IgG index, gram stain and culture Follow up after testing.  Further recommendations pending results.

## 2022-02-20 ENCOUNTER — Other Ambulatory Visit: Payer: Self-pay | Admitting: Internal Medicine

## 2022-02-20 DIAGNOSIS — G4700 Insomnia, unspecified: Secondary | ICD-10-CM

## 2022-02-21 ENCOUNTER — Encounter: Payer: Self-pay | Admitting: Internal Medicine

## 2022-02-21 ENCOUNTER — Ambulatory Visit (INDEPENDENT_AMBULATORY_CARE_PROVIDER_SITE_OTHER): Payer: BC Managed Care – PPO | Admitting: Internal Medicine

## 2022-02-21 VITALS — BP 132/86 | HR 81 | Resp 18 | Ht 75.0 in | Wt 369.4 lb

## 2022-02-21 DIAGNOSIS — Z720 Tobacco use: Secondary | ICD-10-CM

## 2022-02-21 DIAGNOSIS — Z8669 Personal history of other diseases of the nervous system and sense organs: Secondary | ICD-10-CM

## 2022-02-21 DIAGNOSIS — E782 Mixed hyperlipidemia: Secondary | ICD-10-CM

## 2022-02-21 DIAGNOSIS — E669 Obesity, unspecified: Secondary | ICD-10-CM | POA: Diagnosis not present

## 2022-02-21 DIAGNOSIS — Z23 Encounter for immunization: Secondary | ICD-10-CM

## 2022-02-21 DIAGNOSIS — E559 Vitamin D deficiency, unspecified: Secondary | ICD-10-CM

## 2022-02-21 DIAGNOSIS — Z0001 Encounter for general adult medical examination with abnormal findings: Secondary | ICD-10-CM | POA: Insufficient documentation

## 2022-02-21 DIAGNOSIS — G4700 Insomnia, unspecified: Secondary | ICD-10-CM

## 2022-02-21 NOTE — Assessment & Plan Note (Signed)
Diet modification and moderate exercise advised. 

## 2022-02-21 NOTE — Assessment & Plan Note (Signed)
Better with Seroquel now

## 2022-02-21 NOTE — Patient Instructions (Signed)
Please continue taking medications as prescribed.  Please follow low carb diet and perform moderate exercise/walking at least 150 mins/week.  Please get fasting blood tests done within a week.

## 2022-02-21 NOTE — Progress Notes (Signed)
Established Patient Office Visit  Subjective:  Patient ID: Ian Gonzalez, male    DOB: [birth year]  Age: 36 y.o. MRN: [id number]  CC:  Chief Complaint  Patient presents with   Annual Exam    Annual exam     HPI [name] is a 36 y.o. male with past medical history of optic neuritis, anxiety with insomnia, tobacco abuse and morbid obesity who presents for annual physical.  He is vision has improved now with steroids.  He has seen Neurology for history of optic neuritis and is going to get MRI of brain, cervical and thoracic spine.  He denies any numbness, tingling or weakness of the UE or LE currently.   His sleep has improved with Seroquel now.  He denies any anhedonia, spells of anxiety, SI or HI currently.  He has been trying to follow low carb diet to lose weight now and has lost about 12 lbs recently.  Past Medical History:  Diagnosis Date   Bipolar 1 disorder ([place])    Depression    Leukocytosis 02/06/2021   Sleep disorder     Past Surgical History:  Procedure Laterality Date   CHOLECYSTECTOMY N/A 03/18/2015   Procedure: LAPAROSCOPIC CHOLECYSTECTOMY;  Surgeon: Aviva Signs, MD;  Location: [facility];  Service: General;  Laterality: N/A;   NO PAST SURGERIES      Family History  Problem Relation Age of Onset   Heart failure Maternal Grandmother     Social History   Socioeconomic History   Marital status: Single    Spouse name: Not on file   Number of children: Not on file   Years of education: Not on file   Highest education level: Not on file  Occupational History   Not on file  Tobacco Use   Smoking status: Every Day    Packs/day: 1.00    Years: 10.00    Total pack years: 10.00    Types: Cigarettes   Smokeless tobacco: Never  Vaping Use   Vaping Use: Some days  Substance and Sexual Activity   Alcohol use: Yes    Comment: Occassionally    Drug use: No   Sexual activity: Yes    Birth control/protection: None  Other Topics Concern   Not on file   Social History Narrative   Not on file   Social Determinants of Health   Financial Resource Strain: Not on file  Food Insecurity: Not on file  Transportation Needs: Not on file  Physical Activity: Not on file  Stress: Not on file  Social Connections: Not on file  Intimate Partner Violence: Not on file    Outpatient Medications Prior to Visit  Medication Sig Dispense Refill   acetaminophen (TYLENOL) 325 MG tablet Take 650 mg by mouth every 6 (six) hours as needed.     fluticasone (FLONASE) 50 MCG/ACT nasal spray Place 2 sprays into both nostrils daily. 16 g 0   ibuprofen (ADVIL) 200 MG tablet Take 200 mg by mouth every 6 (six) hours as needed.     lidocaine (LIDODERM) 5 % Place 1 patch onto the skin daily. Remove & Discard patch within 12 hours or as directed by MD 30 patch 0   naproxen (NAPROSYN) 500 MG tablet Take 1 tablet (500 mg total) by mouth 2 (two) times daily as needed. 30 tablet 0   omeprazole (PRILOSEC) 20 MG capsule Take 1 capsule (20 mg total) by mouth daily. 30 capsule 0   QUEtiapine (SEROQUEL) 100 MG tablet TAKE  1 TABLET BY MOUTH AT BEDTIME 30 tablet 0   tizanidine (ZANAFLEX) 2 MG capsule Take 1 capsule (2 mg total) by mouth 3 (three) times daily as needed for muscle spasms. Do not drink alcohol or drive while taking this medication.  May cause drowsiness. 15 capsule 0   No facility-administered medications prior to visit.    No Known Allergies  ROS Review of Systems  Constitutional:  Negative for chills and fever.  HENT:  Negative for congestion and sore throat.   Eyes:  Negative for pain and discharge.  Respiratory:  Negative for cough and shortness of breath.   Cardiovascular:  Negative for chest pain and palpitations.  Gastrointestinal:  Negative for diarrhea, nausea and vomiting.  Endocrine: Negative for polydipsia and polyuria.  Genitourinary:  Negative for dysuria and hematuria.  Musculoskeletal:  Negative for neck pain and neck stiffness.  Skin:   Negative for rash.  Neurological:  Negative for dizziness, weakness, numbness and headaches.  Psychiatric/Behavioral:  Negative for agitation, behavioral problems, confusion, sleep disturbance and suicidal ideas. The patient is not nervous/anxious.       Objective:    Physical Exam Vitals reviewed.  Constitutional:      General: He is not in acute distress.    Appearance: He is obese. He is not diaphoretic.  HENT:     Head: Normocephalic and atraumatic.     Nose: Nose normal.     Mouth/Throat:     Mouth: Mucous membranes are moist.  Eyes:     General: No scleral icterus.    Extraocular Movements: Extraocular movements intact.  Cardiovascular:     Rate and Rhythm: Normal rate and regular rhythm.     Pulses: Normal pulses.     Heart sounds: Normal heart sounds. No murmur heard. Pulmonary:     Breath sounds: Normal breath sounds. No wheezing or rales.  Abdominal:     Palpations: Abdomen is soft.     Tenderness: There is no abdominal tenderness.  Musculoskeletal:     Cervical back: Neck supple. No tenderness.     Right lower leg: No edema.     Left lower leg: No edema.  Skin:    General: Skin is warm.     Findings: No rash.  Neurological:     General: No focal deficit present.     Mental Status: He is alert and oriented to person, place, and time.     Cranial Nerves: No cranial nerve deficit.     Sensory: No sensory deficit.     Motor: No weakness.  Psychiatric:        Mood and Affect: Mood normal.        Behavior: Behavior normal.     BP 132/86 (BP Location: Left Arm, Patient Position: Sitting, Cuff Size: Normal)   Pulse 81   Resp 18   Ht 6\' 3"  (1.905 m)   Wt (!) 369 lb 6.4 oz (167.6 kg)   SpO2 95%   BMI 46.17 kg/m  Wt Readings from Last 3 Encounters:  02/21/22 (!) 369 lb 6.4 oz (167.6 kg)  02/08/22 (!) 379 lb 3.2 oz (172 kg)  10/24/21 265 lb (120.2 kg)    Lab Results  Component Value Date   TSH 1.060 05/15/2021   Lab Results  Component Value Date    WBC 10.8 05/15/2021   HGB 15.0 05/15/2021   HCT 47.4 05/15/2021   MCV 82 05/15/2021   PLT 299 05/15/2021   Lab Results  Component Value Date  NA 137 05/15/2021   K 4.9 05/15/2021   CO2 23 05/15/2021   GLUCOSE 140 (H) 05/15/2021   BUN 9 05/15/2021   CREATININE 0.76 05/15/2021   BILITOT 0.5 05/15/2021   ALKPHOS 86 05/15/2021   AST 19 05/15/2021   ALT 19 05/15/2021   PROT 7.3 05/15/2021   ALBUMIN 4.4 05/15/2021   CALCIUM 9.9 05/15/2021   ANIONGAP 11 02/08/2021   EGFR 122 05/15/2021   Lab Results  Component Value Date   CHOL 200 (H) 05/15/2021   Lab Results  Component Value Date   HDL 43 05/15/2021   Lab Results  Component Value Date   LDLCALC 118 (H) 05/15/2021   Lab Results  Component Value Date   TRIG 221 (H) 05/15/2021   Lab Results  Component Value Date   CHOLHDL 4.7 05/15/2021   Lab Results  Component Value Date   HGBA1C 5.2 05/15/2021      Assessment & Plan:   Problem List Items Addressed This Visit       Other   History of optic neuritis    Was hospitalized for optic neuritis, was given IV steroids Blurry vision improved now with oral prednisone Followed by neurology - planned to get MRI of brain, cervical and thoracic spine      Relevant Orders   CMP14+EGFR   CBC with Differential/Platelet   Obesity (BMI 30-39.9)    Diet modification and moderate exercise advised      Relevant Orders   TSH   Tobacco abuse    Smokes about 0.5 pack/day  Asked about quitting: confirms that he/she currently smokes cigarettes Advise to quit smoking: Educated about QUITTING to reduce the risk of cancer, cardio and cerebrovascular disease. Assess willingness: Unwilling to quit at this time, but is working on cutting back. Assist with counseling and pharmacotherapy: Counseled for 5 minutes and literature provided. Arrange for follow up: follow up in 3 months and continue to offer help.      Insomnia    Better with Seroquel now      Relevant Orders    TSH   Encounter for general adult medical examination with abnormal findings - Primary    Physical exam as documented. Fasting blood tests ordered. Flu vaccine at workplace. Tdap vaccine today.      Relevant Orders   CMP14+EGFR   CBC with Differential/Platelet   Other Visit Diagnoses     Vitamin D deficiency       Relevant Orders   VITAMIN D 25 Hydroxy (Vit-D Deficiency, Fractures)   Mixed hyperlipidemia       Relevant Orders   Lipid panel   Need for diphtheria-tetanus-pertussis (Tdap) vaccine       Relevant Orders   Tdap vaccine greater than or equal to 7yo IM (Completed)       No orders of the defined types were placed in this encounter.   Follow-up: Return in about 6 months (around 08/22/2022) for Insomnia and HLD.    Lindell Spar, MD

## 2022-02-21 NOTE — Assessment & Plan Note (Addendum)
Was hospitalized for optic neuritis, was given IV steroids Blurry vision improved now with oral prednisone Followed by neurology - planned to get MRI of brain, cervical and thoracic spine

## 2022-02-21 NOTE — Assessment & Plan Note (Signed)
Smokes about 0.5 pack/day  Asked about quitting: confirms that he/she currently smokes cigarettes Advise to quit smoking: Educated about QUITTING to reduce the risk of cancer, cardio and cerebrovascular disease. Assess willingness: Unwilling to quit at this time, but is working on cutting back. Assist with counseling and pharmacotherapy: Counseled for 5 minutes and literature provided. Arrange for follow up: follow up in 3 months and continue to offer help. 

## 2022-02-21 NOTE — Assessment & Plan Note (Signed)
Physical exam as documented. Fasting blood tests ordered. Flu vaccine at workplace. Tdap vaccine today.

## 2022-02-23 ENCOUNTER — Encounter: Payer: BC Managed Care – PPO | Admitting: Internal Medicine

## 2022-02-27 ENCOUNTER — Telehealth: Payer: Self-pay | Admitting: Neurology

## 2022-02-27 ENCOUNTER — Other Ambulatory Visit: Payer: Self-pay | Admitting: Neurology

## 2022-02-27 ENCOUNTER — Ambulatory Visit: Admission: RE | Admit: 2022-02-27 | Payer: BC Managed Care – PPO | Source: Ambulatory Visit

## 2022-02-27 MED ORDER — DIAZEPAM 5 MG PO TABS: ORAL_TABLET | ORAL | 0 refills | Status: DC

## 2022-02-27 NOTE — Telephone Encounter (Signed)
Called patient and left message Per Dr. Tomi Likens. Ask him to call office if he gets this message.

## 2022-02-27 NOTE — Telephone Encounter (Signed)
Pt states he was to have an MRI done but is severely claustrophilia. He requested a sedative but did not get one so he didn't go through with the MRI today

## 2022-03-02 ENCOUNTER — Ambulatory Visit
Admission: RE | Admit: 2022-03-02 | Discharge: 2022-03-02 | Disposition: A | Discharge status: Home / Self Care | Payer: BC Managed Care – PPO | Source: Ambulatory Visit | Attending: Neurology | Admitting: Neurology

## 2022-03-02 DIAGNOSIS — H538 Other visual disturbances: Secondary | ICD-10-CM | POA: Diagnosis not present

## 2022-03-02 MED ORDER — GADOBENATE DIMEGLUMINE 529 MG/ML IV SOLN
20.0000 mL | Freq: Once | INTRAVENOUS | Status: AC | PRN
  Administered 2022-03-02: 20 mL via INTRAVENOUS

## 2022-03-07 ENCOUNTER — Other Ambulatory Visit: Payer: Self-pay | Admitting: Neurology

## 2022-03-07 MED ORDER — DIAZEPAM 5 MG PO TABS: ORAL_TABLET | ORAL | 0 refills | Status: DC

## 2022-03-08 ENCOUNTER — Telehealth: Payer: Self-pay

## 2022-03-08 NOTE — Telephone Encounter (Signed)
Advised patient please go ahead and schedule for the other MRI's that where missed at his last visit with [facility].

## 2022-03-08 NOTE — Telephone Encounter (Signed)
Pt called back in wanting to let our office know he scheduled his MRI for 03/24/22.

## 2022-03-21 ENCOUNTER — Other Ambulatory Visit: Payer: Self-pay | Admitting: Internal Medicine

## 2022-03-21 DIAGNOSIS — G4700 Insomnia, unspecified: Secondary | ICD-10-CM

## 2022-03-24 ENCOUNTER — Ambulatory Visit: Admission: RE | Admit: 2022-03-24 | Payer: BC Managed Care – PPO | Source: Ambulatory Visit

## 2022-03-24 ENCOUNTER — Ambulatory Visit
Admission: RE | Admit: 2022-03-24 | Discharge: 2022-03-24 | Disposition: A | Discharge status: Home / Self Care | Payer: BC Managed Care – PPO | Source: Ambulatory Visit | Attending: Neurology | Admitting: Neurology

## 2022-03-24 DIAGNOSIS — M5022 Other cervical disc displacement, mid-cervical region, unspecified level: Secondary | ICD-10-CM | POA: Diagnosis not present

## 2022-03-24 DIAGNOSIS — M47812 Spondylosis without myelopathy or radiculopathy, cervical region: Secondary | ICD-10-CM | POA: Diagnosis not present

## 2022-03-24 MED ORDER — GADOPICLENOL 0.5 MMOL/ML IV SOLN
10.0000 mL | Freq: Once | INTRAVENOUS | Status: AC | PRN
  Administered 2022-03-24: 10 mL via INTRAVENOUS

## 2022-03-30 NOTE — Progress Notes (Signed)
Tried calling patient no answer, VM full unable to LVM

## 2022-04-02 NOTE — Progress Notes (Signed)
Tried calling patient no answer. VM full unable to LVM. Mychart message sent to have patient to call the office.

## 2022-04-20 DIAGNOSIS — R69 Illness, unspecified: Secondary | ICD-10-CM | POA: Diagnosis not present

## 2022-04-20 DIAGNOSIS — R52 Pain, unspecified: Secondary | ICD-10-CM | POA: Diagnosis not present

## 2022-06-05 DIAGNOSIS — K047 Periapical abscess without sinus: Secondary | ICD-10-CM | POA: Diagnosis not present

## 2022-06-05 DIAGNOSIS — Z6841 Body Mass Index (BMI) 40.0 and over, adult: Secondary | ICD-10-CM | POA: Diagnosis not present

## 2022-06-05 DIAGNOSIS — R030 Elevated blood-pressure reading, without diagnosis of hypertension: Secondary | ICD-10-CM | POA: Diagnosis not present

## 2022-07-03 ENCOUNTER — Encounter: Payer: Self-pay | Admitting: Genetic Counselor

## 2022-07-04 ENCOUNTER — Other Ambulatory Visit: Payer: Self-pay | Admitting: Genetic Counselor

## 2022-07-04 ENCOUNTER — Inpatient Hospital Stay: Payer: Self-pay

## 2022-07-04 ENCOUNTER — Encounter: Payer: Self-pay | Admitting: Genetic Counselor

## 2022-07-04 ENCOUNTER — Inpatient Hospital Stay: Payer: Self-pay | Attending: Genetic Counselor | Admitting: Genetic Counselor

## 2022-07-04 ENCOUNTER — Other Ambulatory Visit: Payer: Self-pay

## 2022-07-04 DIAGNOSIS — Z808 Family history of malignant neoplasm of other organs or systems: Secondary | ICD-10-CM

## 2022-07-04 DIAGNOSIS — Z8042 Family history of malignant neoplasm of prostate: Secondary | ICD-10-CM

## 2022-07-04 DIAGNOSIS — Z8481 Family history of carrier of genetic disease: Secondary | ICD-10-CM | POA: Insufficient documentation

## 2022-07-04 DIAGNOSIS — Z800 Family history of malignant neoplasm of digestive organs: Secondary | ICD-10-CM

## 2022-07-04 LAB — GENETIC SCREENING ORDER

## 2022-07-04 NOTE — Progress Notes (Signed)
REFERRING PROVIDER: Lindell Spar, MD [address] [place],  [place] [address]  PRIMARY PROVIDER:  Lindell Spar, MD  PRIMARY REASON FOR VISIT:  1. Family history of prostate cancer   2. Family history of colon cancer   3. Family history of melanoma   4. Family history of BRCA2 gene positive      HISTORY OF PRESENT ILLNESS:   Ian Gonzalez, a 37 y.o. male, was seen for a [facility] cancer genetics consultation at the request of Dr. Posey Pronto due to a family history of cancer.  Ian Gonzalez presents to clinic today to discuss the possibility of a hereditary predisposition to cancer, genetic testing, and to further clarify his future cancer risks, as well as potential cancer risks for family members.    Ian Gonzalez is a 37 y.o. male with no personal history of cancer.  He has been diagnosed with Optic neuritis, which may be seen in Leber's Hereditary Optic Neuropathy (LHON).  CANCER HISTORY:  Oncology History   No history exists.    Past Medical History:  Diagnosis Date   Bipolar 1 disorder ([place])    Depression    Family history of BRCA2 gene positive    Family history of colon cancer    Family history of melanoma    Family history of prostate cancer    Leukocytosis 02/06/2021   Sleep disorder     Past Surgical History:  Procedure Laterality Date   CHOLECYSTECTOMY N/A 03/18/2015   Procedure: LAPAROSCOPIC CHOLECYSTECTOMY;  Surgeon: Aviva Signs, MD;  Location: [facility];  Service: General;  Laterality: N/A;   NO PAST SURGERIES      Social History   Socioeconomic History   Marital status: Single    Spouse name: Not on file   Number of children: Not on file   Years of education: Not on file   Highest education level: Not on file  Occupational History   Not on file  Tobacco Use   Smoking status: Every Day    Packs/day: 1.00    Years: 10.00    Total pack years: 10.00    Types: Cigarettes   Smokeless tobacco: Never  Vaping Use   Vaping Use: Some days  Substance and Sexual  Activity   Alcohol use: Yes    Comment: Occassionally    Drug use: No   Sexual activity: Yes    Birth control/protection: None  Other Topics Concern   Not on file  Social History Narrative   Not on file   Social Determinants of Health   Financial Resource Strain: Not on file  Food Insecurity: Not on file  Transportation Needs: Not on file  Physical Activity: Not on file  Stress: Not on file  Social Connections: Not on file     FAMILY HISTORY:  We obtained a detailed, 4-generation family history.  Significant diagnoses are listed below: Family History  Problem Relation Age of Onset   Head & neck cancer Father    Heart failure Maternal Aunt    Congestive Heart Failure Maternal Uncle    Melanoma Paternal Aunt 62       x2   Melanoma Paternal Aunt    Prostate cancer Paternal Uncle 68       BRCA2 [phone]   Heart failure Maternal Grandmother    Heart attack Maternal Grandfather    Colon cancer Paternal Grandmother    Prostate cancer Paternal Grandfather      The patient has one son who is cancer free.  He is an only child.  His mother is living and his father is deceased.  The patient's mother is 63.  She had a sister and two brothers.  None are living but none had cancer. Her parents died of heart disease.  The patient's father died of a head/neck cancer.  He had four sisters and a brother.  Two sisters had melanoma the brother was diagnosed with prostate cancer.  The brother also was found to have a BRCA2 mutation.  The maternal grandfather is deceased from prostate cancer and the grandmother is deceased from colon cancer.  Ian Gonzalez is aware of previous family history of genetic testing for hereditary cancer risks. Patient's maternal ancestors are of [place] descent, and paternal ancestors are of [place] and [place] descent. There is no reported Ashkenazi Jewish ancestry. There is no known consanguinity.  GENETIC COUNSELING ASSESSMENT: Ian Gonzalez is a 37 y.o. male with a  family history of cancer which is somewhat suggestive of a hereditary breast cancer syndrome and predisposition to cancer given the known BRCA2 mutations. We, therefore, discussed and recommended the following at today's visit.   DISCUSSION: We discussed that, in general, most cancer is not inherited in families, but instead is sporadic or familial. Sporadic cancers occur by chance and typically happen at older ages (>50 years) as this type of cancer is caused by genetic changes acquired during an individual's lifetime. Some families have more cancers than would be expected by chance; however, the ages or types of cancer are not consistent with a known genetic mutation or known genetic mutations have been ruled out. This type of familial cancer is thought to be due to a combination of multiple genetic, environmental, hormonal, and lifestyle factors. While this combination of factors likely increases the risk of cancer, the exact source of this risk is not currently identifiable or testable.  We discussed that up to 15% of prostate cancer is hereditary, with most cases associated with BRCA mutations.  There are other genes that can be associated with hereditary prostate cancer syndromes, but the known BRCA2 mutation running in the family explains this history.  Based on his uncles diagnosis, we estimate that Ian Gonzalez has a 25% chance of also having this mutation. We discussed that if he were to have this mutation he would be at increased risk for male breast cancer, prostate cancer, pancreatic cancer and melanoma.  We discussed that testing is beneficial for several reasons including knowing how to follow individuals after completing their treatment, identifying whether potential treatment options such as PARP inhibitors would be beneficial, and understand if other family members could be at risk for cancer and allow them to undergo genetic testing.   Ian Gonzalez has a diagnosis of Optic Neuritis.  This can  sometimes be seen in conditions such as LHON.  LHON is a mitochondrial genetic disease that is maternally inherited.  He reports that he is not aware of male maternal relatives having anything similar.  He lost his vision in his left eye about a year ago after a headache that took him to the ED.  He was hospitalized and underwent CT scans.  Currently he has low vision in his left eye, but his right eye remains as it was.  I discussed that I will talk with colleagues to see if they feel he should consider further evaluation for LHON.  We reviewed the characteristics, features and inheritance patterns of hereditary cancer syndromes. We also discussed genetic testing, including the appropriate  family members to test, the process of testing, insurance coverage and turn-around-time for results. We discussed the implications of a negative, positive, carrier and/or variant of uncertain significant result. Ian Gonzalez  was offered a common hereditary cancer panel (47 genes) and an expanded pan-cancer panel (77 genes). Ian Gonzalez was informed of the benefits and limitations of each panel, including that expanded pan-cancer panels contain genes that do not have clear management guidelines at this point in time.  We also discussed that as the number of genes included on a panel increases, the chances of variants of uncertain significance increases. Ian Gonzalez decided to pursue genetic testing for the single site BRCA2 mutation.   Based on Ian Gonzalez family history of cancer, he meets medical criteria for genetic testing. Since he will undergo the single site testing, [facility] should cover this testing based on their policy of testing family members of positive patients for single site testing up to 90 days after a test is reported out.  We discussed that some people do not want to undergo genetic testing due to fear of genetic discrimination.  The Genetic Information Nondiscrimination Act (GINA) was signed into  federal law in 2008. GINA prohibits health insurers and most employers from discriminating against individuals based on genetic information (including the results of genetic tests and family history information). According to GINA, health insurance companies cannot consider genetic information to be a preexisting condition, nor can they use it to make decisions regarding coverage or rates. GINA also makes it illegal for most employers to use genetic information in making decisions about hiring, firing, promotion, or terms of employment. It is important to note that GINA does not offer protections for life insurance, disability insurance, or long-term care insurance. GINA does not apply to those in the [employer], those who work for companies with less than 15 employees, and new life insurance or long-term disability insurance policies.  Health status due to a cancer diagnosis is not protected under GINA. More information about GINA can be found by visiting [employer].   PLAN: After considering the risks, benefits, and limitations, Ian Gonzalez provided informed consent to pursue genetic testing and the blood sample was sent to [facility] for analysis of the single site BRCA2 mutations. Results should be available within approximately 2-3 weeks' time, at which point they will be disclosed by telephone to Ian Gonzalez, as will any additional recommendations warranted by these results. Ian Gonzalez will receive a summary of his genetic counseling visit and a copy of his results once available. This information will also be available in Epic.   We will also discuss Ian Gonzalez symptoms with other genetic providers to determine if he needs further evaluation for his vision loss.  We will be in contact with Ian Gonzalez in regards to this.  Lastly, we encouraged Ian Gonzalez to remain in contact with cancer genetics annually so that we can continuously update the family history and inform him of any changes in  cancer genetics and testing that may be of benefit for this family.   Ian Gonzalez questions were answered to his satisfaction today. Our contact information was provided should additional questions or concerns arise. Thank you for the referral and allowing [place] to share in the care of your patient.   [place] Orona P. Lowell Guitar, MS, [facility] Licensed, Patent attorney [email or web address] phone: [phone]  The patient was seen for a total of 40 minutes in face-to-face genetic counseling.  The patient was seen alone.  Drs.  Michell Heinrich, and/or [place] were available for questions, if needed..    _______________________________________________________________________ For Office Staff:  Number of people involved in session: 1 Was an Intern/ student involved with case: no

## 2022-07-18 ENCOUNTER — Telehealth: Payer: Self-pay | Admitting: Genetic Counselor

## 2022-07-18 ENCOUNTER — Encounter: Payer: Self-pay | Admitting: Genetic Counselor

## 2022-07-18 DIAGNOSIS — Z1379 Encounter for other screening for genetic and chromosomal anomalies: Secondary | ICD-10-CM | POA: Insufficient documentation

## 2022-07-18 NOTE — Telephone Encounter (Signed)
The patient tested negative for the familial BRCA2 variant identified in his uncle.  This is a true negative result in that there is a known mutation in the family and he does not have it.  Discussed that he is not at increased risk for BRCA2 related cancers.  This does NOT mean he will not get cancer.  Patinet voiced understanding.  Discussed that his aunt was found to have a single pathogenic mutation in an autosomal recessive gene condition called [place].  He would like to pursue the complementary testing for that condition as well.  We will place this order and call in about 2-3 weeks.

## 2022-08-03 ENCOUNTER — Telehealth: Payer: Self-pay | Admitting: Genetic Counselor

## 2022-08-03 NOTE — Telephone Encounter (Signed)
Mailbox is full and I could not leave a message.

## 2022-08-06 ENCOUNTER — Encounter: Payer: Self-pay | Admitting: Genetic Counselor

## 2022-08-08 NOTE — Telephone Encounter (Signed)
Mailbox is still full

## 2022-08-09 ENCOUNTER — Telehealth: Payer: Self-pay | Admitting: Genetic Counselor

## 2022-08-09 ENCOUNTER — Ambulatory Visit: Payer: Self-pay | Admitting: Genetic Counselor

## 2022-08-09 DIAGNOSIS — Z1379 Encounter for other screening for genetic and chromosomal anomalies: Secondary | ICD-10-CM

## 2022-08-09 NOTE — Telephone Encounter (Signed)
Revealed that he was negative for the familial mutation in [facility].  As reported earlier, he was also negative for the familial mutation in BRCA2.  This means that he is a true negative - there are mutations in the family and he did not inherit them.  Therefore, he is not at increased risk for BRCA2 or FANCC cancers, outside of the general population risk.

## 2022-08-09 NOTE — Progress Notes (Signed)
HPI:  Mr. Safier was previously seen in the [facility] clinic due to a family history of prostate cancer and melanoma and concerns regarding a hereditary predisposition to cancer. Please refer to our prior cancer genetics clinic note for more information regarding our discussion, assessment and recommendations, at the time. Mr. Dofflemyer recent genetic test results were disclosed to him, as were recommendations warranted by these results. These results and recommendations are discussed in more detail below.  CANCER HISTORY:  Oncology History   No history exists.    FAMILY HISTORY:  We obtained a detailed, 4-generation family history.  Significant diagnoses are listed below: Family History  Problem Relation Age of Onset   Head & neck cancer Father    Heart failure Maternal Aunt    Congestive Heart Failure Maternal Uncle    Melanoma Paternal Aunt 95       x2   Melanoma Paternal Aunt    Prostate cancer Paternal Uncle 66       BRCA2 [phone]*   Heart failure Maternal Grandmother    Heart attack Maternal Grandfather    Colon cancer Paternal Grandmother    Prostate cancer Paternal Grandfather        The patient has one son who is cancer free.  He is an only child.  His mother is living and his father is deceased.   The patient's mother is 87.  She had a sister and two brothers.  None are living but none had cancer. Her parents died of heart disease.   The patient's father died of a head/neck cancer.  He had four sisters and a brother.  Two sisters had melanoma the brother was diagnosed with prostate cancer.  The brother also was found to have a BRCA2 mutation.  The maternal grandfather is deceased from prostate cancer and the grandmother is deceased from colon cancer.   Mr. Esparza is aware of previous family history of genetic testing for hereditary cancer risks. Patient's maternal ancestors are of [place] descent, and paternal ancestors are of [place] and [place] descent.  There is no reported Ashkenazi Jewish ancestry. There is no known consanguinity.  GENETIC TEST RESULTS: We recommended Mr. Beye pursue testing for the two familial hereditary cancer gene mutations called [place] c.843+5G>A and BRCA2  [id number]* (c.[id number]>G). Mr. Fortuno test was normal and did not reveal the familial mutations. We call this result a true negative result because a cancer-causing mutation was identified in Mr. Syvertson family, and he did not inherit it.  Given this negative result, Mr. Ristic chances of developing BRCA2-related cancers, as well as FANCC-related cancers are the same as they are in the general population.  The test report has been scanned into EPIC and is located under the Molecular Pathology section of the Results Review tab.  A portion of the result report is included below for reference.       We discussed with Mr. Blegen that because current genetic testing is not perfect, it is possible there may be a gene mutation in one of these genes that current testing cannot detect, but that chance is small.  We also discussed, that there could be another gene that has not yet been discovered, or that we have not yet tested, that is responsible for the cancer diagnoses in the family. It is also possible there is a hereditary cause for the cancer in the family that Mr. Vivo did not inherit and therefore was not identified in his testing.  Therefore, it  is important to remain in touch with cancer genetics in the future so that we can continue to offer Mr. Kolm the most up to date genetic testing.   ADDITIONAL GENETIC TESTING: We discussed with Mr. Latimer that there are other genes that are associated with increased cancer risk that can be analyzed. Should Mr. Heilbrun wish to pursue additional genetic testing, we are happy to discuss and coordinate this testing, at any time.    CANCER SCREENING RECOMMENDATIONS: Mr. Fogerty test result is considered negative (normal).  While reassuring,  this does not definitively rule out a hereditary predisposition to cancer. It is still possible that there could be genetic mutations that are undetectable by current technology. There could be genetic mutations in genes that have not been tested or identified to increase cancer risk.  Therefore, it is recommended he continue to follow the cancer management and screening guidelines provided by his  primary healthcare provider.   An individual's cancer risk and medical management are not determined by genetic test results alone. Overall cancer risk assessment incorporates additional factors, including personal medical history, family history, and any available genetic information that may result in a personalized plan for cancer prevention and surveillance  RECOMMENDATIONS FOR FAMILY MEMBERS:  Individuals in this family might be at some increased risk of developing cancer, over the general population risk, simply due to the family history of cancer.  We recommended women in this family have a yearly mammogram beginning at age 72, or 42 years younger than the earliest onset of cancer, an annual clinical breast exam, and perform monthly breast self-exams. Women in this family should also have a gynecological exam as recommended by their primary provider. All family members should be referred for colonoscopy starting at age 107.  FOLLOW-UP: Lastly, we discussed with Mr. Dauzat that cancer genetics is a rapidly advancing field and it is possible that new genetic tests will be appropriate for him and/or his family members in the future. We encouraged him to remain in contact with cancer genetics on an annual basis so we can update his personal and family histories and let him know of advances in cancer genetics that may benefit this family.   Our contact number was provided. Mr. Heberlein questions were answered to his satisfaction, and he knows he is welcome to call us at anytime with additional questions or  concerns.   Roma Kayser, [place], [facility] Licensed, Certified Genetic Counselor [email or web address]

## 2022-08-13 ENCOUNTER — Other Ambulatory Visit: Payer: Self-pay | Admitting: Internal Medicine

## 2022-08-13 DIAGNOSIS — G4700 Insomnia, unspecified: Secondary | ICD-10-CM

## 2022-08-22 ENCOUNTER — Ambulatory Visit: Payer: BC Managed Care – PPO | Admitting: Internal Medicine

## 2022-08-23 ENCOUNTER — Encounter: Payer: Self-pay | Admitting: Internal Medicine

## 2022-09-09 ENCOUNTER — Other Ambulatory Visit: Payer: Self-pay | Admitting: Internal Medicine

## 2022-09-09 DIAGNOSIS — G4700 Insomnia, unspecified: Secondary | ICD-10-CM

## 2022-09-13 ENCOUNTER — Other Ambulatory Visit: Payer: Self-pay | Admitting: Internal Medicine

## 2022-09-13 DIAGNOSIS — G4700 Insomnia, unspecified: Secondary | ICD-10-CM

## 2022-09-15 ENCOUNTER — Other Ambulatory Visit: Payer: Self-pay | Admitting: Internal Medicine

## 2022-09-15 DIAGNOSIS — G4700 Insomnia, unspecified: Secondary | ICD-10-CM

## 2023-01-08 ENCOUNTER — Other Ambulatory Visit: Payer: Self-pay

## 2023-01-08 ENCOUNTER — Encounter (HOSPITAL_COMMUNITY): Payer: Self-pay | Admitting: Internal Medicine

## 2023-01-08 ENCOUNTER — Emergency Department (HOSPITAL_COMMUNITY): Payer: Self-pay

## 2023-01-08 ENCOUNTER — Inpatient Hospital Stay (HOSPITAL_COMMUNITY)
Admission: EM | Admit: 2023-01-08 | Discharge: 2023-01-09 | DRG: 390 | Disposition: A | Discharge status: Home / Self Care | Payer: Self-pay | Attending: Family Medicine | Admitting: Family Medicine

## 2023-01-08 DIAGNOSIS — K56609 Unspecified intestinal obstruction, unspecified as to partial versus complete obstruction: Secondary | ICD-10-CM | POA: Diagnosis present

## 2023-01-08 DIAGNOSIS — Z9049 Acquired absence of other specified parts of digestive tract: Secondary | ICD-10-CM

## 2023-01-08 DIAGNOSIS — Z808 Family history of malignant neoplasm of other organs or systems: Secondary | ICD-10-CM

## 2023-01-08 DIAGNOSIS — Z800 Family history of malignant neoplasm of digestive organs: Secondary | ICD-10-CM

## 2023-01-08 DIAGNOSIS — Z79899 Other long term (current) drug therapy: Secondary | ICD-10-CM

## 2023-01-08 DIAGNOSIS — Z720 Tobacco use: Secondary | ICD-10-CM | POA: Diagnosis present

## 2023-01-08 DIAGNOSIS — K219 Gastro-esophageal reflux disease without esophagitis: Secondary | ICD-10-CM | POA: Diagnosis present

## 2023-01-08 DIAGNOSIS — K56600 Partial intestinal obstruction, unspecified as to cause: Principal | ICD-10-CM | POA: Diagnosis present

## 2023-01-08 DIAGNOSIS — K529 Noninfective gastroenteritis and colitis, unspecified: Secondary | ICD-10-CM | POA: Diagnosis present

## 2023-01-08 DIAGNOSIS — Z8669 Personal history of other diseases of the nervous system and sense organs: Secondary | ICD-10-CM

## 2023-01-08 DIAGNOSIS — G4700 Insomnia, unspecified: Secondary | ICD-10-CM | POA: Diagnosis present

## 2023-01-08 DIAGNOSIS — Z8249 Family history of ischemic heart disease and other diseases of the circulatory system: Secondary | ICD-10-CM

## 2023-01-08 DIAGNOSIS — Z8042 Family history of malignant neoplasm of prostate: Secondary | ICD-10-CM

## 2023-01-08 DIAGNOSIS — E669 Obesity, unspecified: Secondary | ICD-10-CM | POA: Diagnosis present

## 2023-01-08 DIAGNOSIS — R7309 Other abnormal glucose: Secondary | ICD-10-CM

## 2023-01-08 DIAGNOSIS — F419 Anxiety disorder, unspecified: Secondary | ICD-10-CM | POA: Diagnosis present

## 2023-01-08 DIAGNOSIS — F319 Bipolar disorder, unspecified: Secondary | ICD-10-CM | POA: Diagnosis present

## 2023-01-08 DIAGNOSIS — F17210 Nicotine dependence, cigarettes, uncomplicated: Secondary | ICD-10-CM | POA: Diagnosis present

## 2023-01-08 LAB — COMPREHENSIVE METABOLIC PANEL
ALT: 17 U/L (ref 0–44)
AST: 17 U/L (ref 15–41)
Albumin: 3.8 g/dL (ref 3.5–5.0)
Alkaline Phosphatase: 79 U/L (ref 38–126)
Anion gap: 8 (ref 5–15)
BUN: 8 mg/dL (ref 6–20)
CO2: 29 mmol/L (ref 22–32)
Calcium: 9.1 mg/dL (ref 8.9–10.3)
Chloride: 99 mmol/L (ref 98–111)
Creatinine, Ser: 0.7 mg/dL (ref 0.61–1.24)
GFR, Estimated: >60 mL/min (ref >60)
Glucose, Bld: 138 mg/dL — ABNORMAL HIGH (ref 70–99)
Potassium: 4.4 mmol/L (ref 3.5–5.1)
Sodium: 136 mmol/L (ref 135–145)
Total Bilirubin: 0.7 mg/dL (ref 0.3–1.2)
Total Protein: 7.8 g/dL (ref 6.5–8.1)

## 2023-01-08 LAB — CBC
HCT: 47.1 % (ref 39.0–52.0)
Hemoglobin: 15.2 g/dL (ref 13.0–17.0)
MCH: 26.2 pg (ref 26.0–34.0)
MCHC: 32.3 g/dL (ref 30.0–36.0)
MCV: 81.2 fL (ref 80.0–100.0)
Platelets: 276 10*3/uL (ref 150–400)
RBC: 5.8 MIL/uL (ref 4.22–5.81)
RDW: 14.2 % (ref 11.5–15.5)
WBC: 15.3 10*3/uL — ABNORMAL HIGH (ref 4.0–10.5)
nRBC: 0 % (ref 0.0–0.2)

## 2023-01-08 LAB — URINALYSIS, ROUTINE W REFLEX MICROSCOPIC
Bilirubin Urine: NEGATIVE
Glucose, UA: NEGATIVE mg/dL
Hgb urine dipstick: NEGATIVE
Ketones, ur: NEGATIVE mg/dL
Leukocytes,Ua: NEGATIVE
Nitrite: NEGATIVE
Protein, ur: NEGATIVE mg/dL
Specific Gravity, Urine: 1.017 (ref 1.005–1.030)
pH: 8 (ref 5.0–8.0)

## 2023-01-08 LAB — LIPASE, BLOOD: Lipase: 26 U/L (ref 11–51)

## 2023-01-08 MED ORDER — LACTATED RINGERS IV SOLN: INTRAVENOUS | Status: DC

## 2023-01-08 MED ORDER — HYDROMORPHONE HCL 1 MG/ML IJ SOLN
0.5000 mg | INTRAMUSCULAR | Status: DC | PRN
  Administered 2023-01-08 – 2023-01-09 (×4): 1 mg via INTRAVENOUS
  Filled 2023-01-08 (×4): qty 1

## 2023-01-08 MED ORDER — NICOTINE 21 MG/24HR TD PT24
21.0000 mg | MEDICATED_PATCH | Freq: Every day | TRANSDERMAL | Status: DC
  Administered 2023-01-08 – 2023-01-09 (×2): 21 mg via TRANSDERMAL
  Filled 2023-01-08 (×2): qty 1

## 2023-01-08 MED ORDER — QUETIAPINE FUMARATE 25 MG PO TABS
100.0000 mg | ORAL_TABLET | Freq: Every day | ORAL | Status: DC
  Administered 2023-01-08: 100 mg via ORAL
  Filled 2023-01-08: qty 4

## 2023-01-08 MED ORDER — ONDANSETRON HCL 4 MG/2ML IJ SOLN
4.0000 mg | Freq: Once | INTRAMUSCULAR | Status: AC
  Administered 2023-01-08: 4 mg via INTRAVENOUS
  Filled 2023-01-08: qty 2

## 2023-01-08 MED ORDER — ENOXAPARIN SODIUM 80 MG/0.8ML IJ SOSY
80.0000 mg | PREFILLED_SYRINGE | INTRAMUSCULAR | Status: DC
  Administered 2023-01-08: 80 mg via SUBCUTANEOUS
  Filled 2023-01-08: qty 0.8

## 2023-01-08 MED ORDER — SODIUM CHLORIDE 0.9 % IV SOLN
25.0000 mg | Freq: Four times a day (QID) | INTRAVENOUS | Status: DC | PRN
  Administered 2023-01-08: 25 mg via INTRAVENOUS
  Filled 2023-01-08: qty 1

## 2023-01-08 MED ORDER — ACETAMINOPHEN 325 MG PO TABS: 650.0000 mg | ORAL_TABLET | Freq: Four times a day (QID) | ORAL | Status: DC | PRN

## 2023-01-08 MED ORDER — PANTOPRAZOLE SODIUM 40 MG IV SOLR
40.0000 mg | INTRAVENOUS | Status: DC
  Administered 2023-01-08 – 2023-01-09 (×2): 40 mg via INTRAVENOUS
  Filled 2023-01-08 (×2): qty 10

## 2023-01-08 MED ORDER — HYDROMORPHONE HCL 1 MG/ML IJ SOLN
1.0000 mg | Freq: Once | INTRAMUSCULAR | Status: AC
  Administered 2023-01-08: 1 mg via INTRAVENOUS
  Filled 2023-01-08: qty 1

## 2023-01-08 MED ORDER — SODIUM CHLORIDE 0.9 % IV BOLUS
1000.0000 mL | Freq: Once | INTRAVENOUS | Status: AC
  Administered 2023-01-08: 1000 mL via INTRAVENOUS

## 2023-01-08 MED ORDER — MORPHINE SULFATE (PF) 4 MG/ML IV SOLN
4.0000 mg | Freq: Once | INTRAVENOUS | Status: AC
  Administered 2023-01-08: 4 mg via INTRAVENOUS
  Filled 2023-01-08: qty 1

## 2023-01-08 MED ORDER — ACETAMINOPHEN 650 MG RE SUPP: 650.0000 mg | Freq: Four times a day (QID) | RECTAL | Status: DC | PRN

## 2023-01-08 MED ORDER — ONDANSETRON HCL 4 MG/2ML IJ SOLN
4.0000 mg | Freq: Four times a day (QID) | INTRAMUSCULAR | Status: DC | PRN
  Administered 2023-01-08: 4 mg via INTRAVENOUS
  Filled 2023-01-08: qty 2

## 2023-01-08 MED ORDER — IOHEXOL 300 MG/ML  SOLN
100.0000 mL | Freq: Once | INTRAMUSCULAR | Status: AC | PRN
  Administered 2023-01-08: 100 mL via INTRAVENOUS

## 2023-01-08 NOTE — ED Notes (Signed)
Patient transported to CT 

## 2023-01-08 NOTE — ED Triage Notes (Signed)
Pt states he woke up about 0330 with severe abdominal pain and N/V. Pt diaphoretic in triage

## 2023-01-08 NOTE — ED Provider Notes (Signed)
[place] EMERGENCY DEPARTMENT AT [facility] Provider Note   CSN: [id number] Arrival date & time: 01/08/23  0408     History  Chief Complaint  Patient presents with   Abdominal Pain    Ian Gonzalez is a 37 y.o. male.  The history is provided by the patient.  Abdominal Pain He comes in complaining of upper abdominal pain which started about 10 PM and then got much worse overnight.  He had taken some antacids and famotidine without relief.  He has had associated nausea and vomiting.  There is minimal improvement of pain following emesis.  He denies any blood in the emesis.  He has never had pain like this before.  Pain does radiate to the back and minimally to the chest.  He does smoke half pack of cigarettes a day but denies ethanol use.   Home Medications Prior to Admission medications   Medication Sig Start Date End Date Taking? Authorizing Provider  acetaminophen (TYLENOL) 325 MG tablet Take 650 mg by mouth every 6 (six) hours as needed.    [provider]  diazepam (VALIUM) 5 MG tablet Take 1 tablet 30-40 minutes prior to MRI.  May repeat at MRI if needed. 03/07/22   Everlena Cooper, Adam R, DO  fluticasone (FLONASE) 50 MCG/ACT nasal spray Place 2 sprays into both nostrils daily. 03/26/21   Domenick Gong, MD  ibuprofen (ADVIL) 200 MG tablet Take 200 mg by mouth every 6 (six) hours as needed.    [provider]  lidocaine (LIDODERM) 5 % Place 1 patch onto the skin daily. Remove & Discard patch within 12 hours or as directed by MD 12/13/21   Particia Nearing, PA-C  naproxen (NAPROSYN) 500 MG tablet Take 1 tablet (500 mg total) by mouth 2 (two) times daily as needed. 10/24/21   Particia Nearing, PA-C  omeprazole (PRILOSEC) 20 MG capsule Take 1 capsule (20 mg total) by mouth daily. 06/26/21   Anabel Halon, MD  QUEtiapine (SEROQUEL) 100 MG tablet TAKE 1 TABLET BY MOUTH AT BEDTIME 08/13/22   Anabel Halon, MD  tizanidine (ZANAFLEX) 2 MG capsule Take 1  capsule (2 mg total) by mouth 3 (three) times daily as needed for muscle spasms. Do not drink alcohol or drive while taking this medication.  May cause drowsiness. 12/13/21   Particia Nearing, PA-C      Allergies    Patient has no known allergies.    Review of Systems   Review of Systems  Gastrointestinal:  Positive for abdominal pain.  All other systems reviewed and are negative.   Physical Exam Updated Vital Signs BP (!) 150/67 (BP Location: Right Arm)   Pulse 79   Temp 97.7 F (36.5 C) (Oral)   Resp 20   SpO2 95%  Physical Exam Vitals and nursing note reviewed.   37 year old male, r appears uncomfortable, but is in no acute distress. Vital signs are significant for elevated blood pressure. Oxygen saturation is 95%, which is normal. Head is normocephalic and atraumatic. PERRLA, EOMI. Oropharynx is clear. Neck is nontender and supple without adenopathy or JVD. Back is nontender and there is no CVA tenderness. Lungs are clear without rales, wheezes, or rhonchi. Chest is nontender. Heart has regular rate and rhythm without murmur. Abdomen is soft, flat, with moderate tenderness in the epigastric area.  There is no right upper quadrant tenderness.  There is no rebound or guarding.  Peristalsis is hypoactive. Extremities have no cyanosis or  edema, full range of motion is present. Skin is warm and dry without rash. Neurologic: Mental status is normal, cranial nerves are intact, moves all extremities equally.  ED Results / Procedures / Treatments   Labs (all labs ordered are listed, but only abnormal results are displayed) Labs Reviewed  COMPREHENSIVE METABOLIC PANEL - Abnormal; Notable for the following components:      Result Value   Glucose, Bld 138 (*)    All other components within normal limits  CBC - Abnormal; Notable for the following components:   WBC 15.3 (*)    All other components within normal limits  URINALYSIS, ROUTINE W REFLEX MICROSCOPIC - Abnormal;  Notable for the following components:   APPearance CLOUDY (*)    All other components within normal limits  LIPASE, BLOOD   Radiology CT ABDOMEN PELVIS W CONTRAST  Result Date: 01/08/2023 CLINICAL DATA:  Awoke with severe abdominal pain in the left upper quadrant. EXAM: CT ABDOMEN AND PELVIS WITH CONTRAST TECHNIQUE: Multidetector CT imaging of the abdomen and pelvis was performed using the standard protocol following bolus administration of intravenous contrast. RADIATION DOSE REDUCTION: This exam was performed according to the departmental dose-optimization program which includes automated exposure control, adjustment of the mA and/or kV according to patient size and/or use of iterative reconstruction technique. CONTRAST:  OMNIPAQUE IOHEXOL 300 MG/ML  SOLN COMPARISON:  03/21/2015 FINDINGS: Lower chest:  No contributory findings. Hepatobiliary: No focal liver abnormality.Cholecystectomy. No biliary dilatation. Pancreas: Unremarkable. Spleen: Unremarkable. Adrenals/Urinary Tract: Negative adrenals. No hydronephrosis or stone. Unremarkable bladder. Stomach/Bowel: Cluster of fluid distended small bowel loops in the left abdomen with mild mesenteric fat stranding. There is no discrete transition point but other small bowel loops are not distended. No low-density bowel wall thickening or evidence of mass. No appendicitis Vascular/Lymphatic: No acute vascular abnormality. Major mesenteric vessels are enhancing. No mass or adenopathy. Reproductive:No pathologic findings. Other: No ascites or pneumoperitoneum. Musculoskeletal: No acute abnormalities. IMPRESSION: 1. Cluster of fluid-filled small bowel loops in the left abdomen which could reflect partial obstruction or enteritis. 2. Cholecystectomy. Electronically Signed   By: Tiburcio Pea M.D.   On: 01/08/2023 07:31    Procedures Procedures  Cardiac monitor shows sinus rhythm without ectopy, per my interpretation.  Medications Ordered in  ED Medications  sodium chloride 0.9 % bolus 1,000 mL (has no administration in time range)  ondansetron (ZOFRAN) injection 4 mg (has no administration in time range)  morphine (PF) 4 MG/ML injection 4 mg (has no administration in time range)  iohexol (OMNIPAQUE) 300 MG/ML solution 100 mL (has no administration in time range)    ED Course/ Medical Decision Making/ A&P                             Medical Decision Making Amount and/or Complexity of Data Reviewed Labs: ordered. Radiology: ordered.  Risk Prescription drug management. Decision regarding hospitalization.   Upper abdominal pain with vomiting.  Differential diagnosis includes, but is not limited to, viral gastritis, biliary colic, pancreatitis, peptic ulcer disease, GERD, bowel obstruction, diverticulitis.  This includes conditions with significant risk of complications and morbidity.  I have reviewed and interpreted his laboratory test, and my interpretation is elevated random glucose which will need to be followed as an outpatient, normal lipase, leukocytosis which is nonspecific.  I have ordered IV fluids, morphine, ondansetron and have ordered CT of abdomen and pelvis.  CT scan shows partial small bowel obstruction.  I have  independently viewed the images, and agree with the radiologist's interpretation.  Patient did not get adequate relief of pain with morphine, I have ordered a dose of hydromorphone.  I have discussed the case with Dr. Sherryll Burger of [facility], who agrees to admit the patient.  Final Clinical Impression(s) / ED Diagnoses Final diagnoses:  Partial small bowel obstruction [facility])    Rx / DC Orders ED Discharge Orders     None         Dione Booze, MD 01/08/23 [phone]

## 2023-01-08 NOTE — ED Notes (Signed)
ED TO INPATIENT HANDOFF REPORT  ED Nurse Name and Phone #: [phone]  S Name/Age/Gender Ian Gonzalez 37 y.o. male Room/Bed: APA08/APA08  Code Status   Code Status: Full Code  Home/SNF/Other home  Patient oriented to: self, place, time, and situation Is this baseline? Yes   Triage Complete: Triage complete  Chief Complaint SBO (small bowel obstruction) (HCC) [K56.609]  Triage Note Pt states he woke up about 0330 with severe abdominal pain and N/V. Pt diaphoretic in triage    Allergies No Known Allergies  Level of Care/Admitting Diagnosis ED Disposition     ED Disposition  Admit   Condition  --   Comment  Hospital Area: [facility] [[id number]]  Level of Care: Med-Surg [16]  Covid Evaluation: Asymptomatic - no recent exposure (last 10 days) testing not required  Diagnosis: SBO (small bowel obstruction) [facility]) [[id number]]  Admitting Physician: Erick Blinks [[id number]]  Attending Physician: Maurilio Lovely D [id number]  Certification:: I certify this patient will need inpatient services for at least 2 midnights  Estimated Length of Stay: 3          B Medical/Surgery History Past Medical History:  Diagnosis Date   Bipolar 1 disorder (HCC)    Depression    Family history of BRCA2 gene positive    Family history of colon cancer    Family history of melanoma    Family history of prostate cancer    Leukocytosis 02/06/2021   Sleep disorder    Past Surgical History:  Procedure Laterality Date   CHOLECYSTECTOMY N/A 03/18/2015   Procedure: LAPAROSCOPIC CHOLECYSTECTOMY;  Surgeon: Franky Macho, MD;  Location: [facility];  Service: General;  Laterality: N/A;   NO PAST SURGERIES       A IV Location/Drains/Wounds Patient Lines/Drains/Airways Status     Active Line/Drains/Airways     Name Placement date Placement time Site Days   Peripheral IV 01/08/23 20 G Distal;Posterior;Right Forearm 01/08/23  0642  Forearm  less than 1            Intake/Output Last 24  hours  Intake/Output Summary (Last 24 hours) at 01/08/2023 0915 Last data filed at 01/08/2023 0905 Gross per 24 hour  Intake 1000 ml  Output --  Net 1000 ml    Labs/Imaging Results for orders placed or performed during the hospital encounter of 01/08/23 (from the past 48 hour(s))  Urinalysis, Routine w reflex microscopic -Urine, Clean Catch     Status: Abnormal   Collection Time: 01/08/23  4:17 AM  Result Value Ref Range   Color, Urine YELLOW YELLOW   APPearance CLOUDY (A) CLEAR   Specific Gravity, Urine 1.017 1.005 - 1.030   pH 8.0 5.0 - 8.0   Glucose, UA NEGATIVE NEGATIVE mg/dL   Hgb urine dipstick NEGATIVE NEGATIVE   Bilirubin Urine NEGATIVE NEGATIVE   Ketones, ur NEGATIVE NEGATIVE mg/dL   Protein, ur NEGATIVE NEGATIVE mg/dL   Nitrite NEGATIVE NEGATIVE   Leukocytes,Ua NEGATIVE NEGATIVE    Comment: Performed at [facility], [address], [place], [place] [address]  Lipase, blood     Status: None   Collection Time: 01/08/23  5:19 AM  Result Value Ref Range   Lipase 26 11 - 51 U/L    Comment: Performed at [facility], [address] [place], [place] [address]  Comprehensive metabolic panel     Status: Abnormal   Collection Time: 01/08/23  5:19 AM  Result Value Ref Range   Sodium 136 135 - 145 mmol/L  Potassium 4.4 3.5 - 5.1 mmol/L   Chloride 99 98 - 111 mmol/L   CO2 29 22 - 32 mmol/L   Glucose, Bld 138 (H) 70 - 99 mg/dL    Comment: Glucose reference range applies only to samples taken after fasting for at least 8 hours.   BUN 8 6 - 20 mg/dL   Creatinine, Ser 1.61 0.61 - 1.24 mg/dL   Calcium 9.1 8.9 - 09.6 mg/dL   Total Protein 7.8 6.5 - 8.1 g/dL   Albumin 3.8 3.5 - 5.0 g/dL   AST 17 15 - 41 U/L   ALT 17 0 - 44 U/L   Alkaline Phosphatase 79 38 - 126 U/L   Total Bilirubin 0.7 0.3 - 1.2 mg/dL   GFR, Estimated >04 >54 mL/min    Comment: (NOTE) Calculated using the CKD-EPI Creatinine Equation (2021)    Anion gap 8 5 - 15    Comment: Performed at [facility], [address], [place], [place] [address]  CBC     Status: Abnormal   Collection Time: 01/08/23  5:19 AM  Result Value Ref Range   WBC 15.3 (H) 4.0 - 10.5 K/uL   RBC 5.80 4.22 - 5.81 MIL/uL   Hemoglobin 15.2 13.0 - 17.0 g/dL   HCT 91.4 78.2 - 95.6 %   MCV 81.2 80.0 - 100.0 fL   MCH 26.2 26.0 - 34.0 pg   MCHC 32.3 30.0 - 36.0 g/dL   RDW 21.3 08.6 - 57.8 %   Platelets 276 150 - 400 K/uL   nRBC 0.0 0.0 - 0.2 %    Comment: Performed at [facility], [address] [place], [place] [address]   CT ABDOMEN PELVIS W CONTRAST  Result Date: 01/08/2023 CLINICAL DATA:  Awoke with severe abdominal pain in the left upper quadrant. EXAM: CT ABDOMEN AND PELVIS WITH CONTRAST TECHNIQUE: Multidetector CT imaging of the abdomen and pelvis was performed using the standard protocol following bolus administration of intravenous contrast. RADIATION DOSE REDUCTION: This exam was performed according to the departmental dose-optimization program which includes automated exposure control, adjustment of the mA and/or kV according to patient size and/or use of iterative reconstruction technique. CONTRAST:  OMNIPAQUE IOHEXOL 300 MG/ML  SOLN COMPARISON:  03/21/2015 FINDINGS: Lower chest:  No contributory findings. Hepatobiliary: No focal liver abnormality.Cholecystectomy. No biliary dilatation. Pancreas: Unremarkable. Spleen: Unremarkable. Adrenals/Urinary Tract: Negative adrenals. No hydronephrosis or stone. Unremarkable bladder. Stomach/Bowel: Cluster of fluid distended small bowel loops in the left abdomen with mild mesenteric fat stranding. There is no discrete transition point but other small bowel loops are not distended. No low-density bowel wall thickening or evidence of mass. No appendicitis Vascular/Lymphatic: No acute vascular abnormality. Major mesenteric vessels are enhancing. No mass or adenopathy. Reproductive:No pathologic findings. Other: No ascites or pneumoperitoneum. Musculoskeletal: No acute  abnormalities. IMPRESSION: 1. Cluster of fluid-filled small bowel loops in the left abdomen which could reflect partial obstruction or enteritis. 2. Cholecystectomy. Electronically Signed   By: Tiburcio Pea M.D.   On: 01/08/2023 07:31    Pending Labs Unresulted Labs (From admission, onward)     Start     Ordered   01/15/23 0500  Creatinine, serum  (enoxaparin (LOVENOX)    CrCl >/= 30 ml/min)  Weekly,   R     Comments: while on enoxaparin therapy    01/08/23 0837   01/09/23 0500  Magnesium  Tomorrow morning,   R        01/08/23 0837   01/09/23 0500  Comprehensive metabolic panel  Tomorrow morning,   R        01/08/23 0837   01/09/23 0500  CBC  Tomorrow morning,   R        01/08/23 0837   01/08/23 0837  HIV Antibody (routine testing w rflx)  (HIV Antibody (Routine testing w reflex) panel)  Once,   R        01/08/23 0837            Vitals/Pain Today's Vitals   01/08/23 0815 01/08/23 0816 01/08/23 0818 01/08/23 0903  BP: (!) 155/100     Pulse: 70     Resp: 11     Temp:  97.6 F (36.4 C)    TempSrc:  Oral    SpO2: 94%     Weight:   (!) 163.3 kg   Height:   6\' 2"  (1.88 m)   PainSc:    7     Isolation Precautions No active isolations  Medications Medications  nicotine (NICODERM CQ - dosed in mg/24 hours) patch 21 mg (21 mg Transdermal Patch Applied 01/08/23 0903)  ondansetron (ZOFRAN) injection 4 mg (has no administration in time range)  QUEtiapine (SEROQUEL) tablet 100 mg (has no administration in time range)  pantoprazole (PROTONIX) injection 40 mg (40 mg Intravenous Given 01/08/23 0902)  enoxaparin (LOVENOX) injection 80 mg (has no administration in time range)  lactated ringers infusion ( Intravenous New Bag/Given 01/08/23 0906)  acetaminophen (TYLENOL) tablet 650 mg (has no administration in time range)    Or  acetaminophen (TYLENOL) suppository 650 mg (has no administration in time range)  HYDROmorphone (DILAUDID) injection 0.5-1 mg (has no administration in time  range)  sodium chloride 0.9 % bolus 1,000 mL (0 mLs Intravenous Stopped 01/08/23 0905)  ondansetron (ZOFRAN) injection 4 mg (4 mg Intravenous Given 01/08/23 0644)  morphine (PF) 4 MG/ML injection 4 mg (4 mg Intravenous Given 01/08/23 0645)  iohexol (OMNIPAQUE) 300 MG/ML solution 100 mL (100 mLs Intravenous Contrast Given 01/08/23 0650)  HYDROmorphone (DILAUDID) injection 1 mg (1 mg Intravenous Given 01/08/23 1478)    Mobility walks     Focused Assessments    R Recommendations: See Admitting Provider Note  Report given to:   Additional Notes:

## 2023-01-08 NOTE — H&P (Signed)
History and Physical    GORDON TRISLER ZHY:[id number] DOB: 04-22-1986 DOA: 01/08/2023  PCP: Anabel Halon, MD   Patient coming from: Home  Chief Complaint: Abdominal pain/N/V  HPI: Ian Gonzalez is a 37 y.o. male with medical history significant for obesity, depression/anxiety, insomnia, GERD, and tobacco abuse who presented to the ED with sudden onset upper abdominal pain along with some nausea and vomiting that began last night at approximately 10 PM.  This worsened overnight and into the morning.  He denies any hematemesis and states that the pain radiates to his back and minimally into his chest as well.  He smokes a pack of cigarettes daily but denies alcohol use.  He has had prior history of cholecystectomy about 10 years prior and states that he has had 2 bowel movements while in the ED.   ED Course: Vital signs stable and patient afebrile.  Leukocytosis of 15,300 noted.  Urine analysis within normal limits.  CT of the abdomen pelvis demonstrating signs of partial obstruction versus enteritis.  He has been given some IV fluid and symptomatic management in the ED.  Review of Systems: Reviewed as noted above, otherwise negative.  Past Medical History:  Diagnosis Date   Bipolar 1 disorder (HCC)    Depression    Family history of BRCA2 gene positive    Family history of colon cancer    Family history of melanoma    Family history of prostate cancer    Leukocytosis 02/06/2021   Sleep disorder     Past Surgical History:  Procedure Laterality Date   CHOLECYSTECTOMY N/A 03/18/2015   Procedure: LAPAROSCOPIC CHOLECYSTECTOMY;  Surgeon: Franky Macho, MD;  Location: [facility];  Service: General;  Laterality: N/A;   NO PAST SURGERIES       reports that he has been smoking cigarettes. He has a 10 pack-year smoking history. He has never used smokeless tobacco. He reports current alcohol use. He reports that he does not use drugs.  No Known Allergies  Family History  Problem Relation Age  of Onset   Head & neck cancer Father    Heart failure Maternal Aunt    Congestive Heart Failure Maternal Uncle    Melanoma Paternal Aunt 63       x2   Melanoma Paternal Aunt    Prostate cancer Paternal Uncle 75       BRCA2 [phone]*   Heart failure Maternal Grandmother    Heart attack Maternal Grandfather    Colon cancer Paternal Grandmother    Prostate cancer Paternal Grandfather     Prior to Admission medications   Medication Sig Start Date End Date Taking? Authorizing Provider  acetaminophen (TYLENOL) 325 MG tablet Take 650 mg by mouth every 6 (six) hours as needed.    [provider]  diazepam (VALIUM) 5 MG tablet Take 1 tablet 30-40 minutes prior to MRI.  May repeat at MRI if needed. 03/07/22   Everlena Cooper, Adam R, DO  fluticasone (FLONASE) 50 MCG/ACT nasal spray Place 2 sprays into both nostrils daily. 03/26/21   Domenick Gong, MD  ibuprofen (ADVIL) 200 MG tablet Take 200 mg by mouth every 6 (six) hours as needed.    [provider]  lidocaine (LIDODERM) 5 % Place 1 patch onto the skin daily. Remove & Discard patch within 12 hours or as directed by MD 12/13/21   Particia Nearing, PA-C  naproxen (NAPROSYN) 500 MG tablet Take 1 tablet (500 mg total) by mouth 2 (two) times daily  as needed. 10/24/21   Particia Nearing, PA-C  omeprazole (PRILOSEC) 20 MG capsule Take 1 capsule (20 mg total) by mouth daily. 06/26/21   Anabel Halon, MD  QUEtiapine (SEROQUEL) 100 MG tablet TAKE 1 TABLET BY MOUTH AT BEDTIME 08/13/22   Anabel Halon, MD  tizanidine (ZANAFLEX) 2 MG capsule Take 1 capsule (2 mg total) by mouth 3 (three) times daily as needed for muscle spasms. Do not drink alcohol or drive while taking this medication.  May cause drowsiness. 12/13/21   Particia Nearing, PA-C    Physical Exam: Vitals:   01/08/23 0645 01/08/23 0815 01/08/23 0816 01/08/23 0818  BP: (!) 159/89 (!) 155/100    Pulse: 65 70    Resp: 13 11    Temp:   97.6 F (36.4 C)   TempSrc:   Oral    SpO2: 97% 94%    Weight:    (!) 163.3 kg  Height:    6\' 2"  (1.88 m)    Constitutional: NAD, calm, comfortable, obese, smells of tobacco Vitals:   01/08/23 0645 01/08/23 0815 01/08/23 0816 01/08/23 0818  BP: (!) 159/89 (!) 155/100    Pulse: 65 70    Resp: 13 11    Temp:   97.6 F (36.4 C)   TempSrc:   Oral   SpO2: 97% 94%    Weight:    (!) 163.3 kg  Height:    6\' 2"  (1.88 m)   Eyes: lids and conjunctivae normal Neck: normal, supple Respiratory: clear to auscultation bilaterally. Normal respiratory effort. No accessory muscle use.  Cardiovascular: Regular rate and rhythm, no murmurs. Abdomen: Upper abdominal tenderness and guarding present, no distention.  Obese. Musculoskeletal:  No edema. Skin: no rashes, lesions, ulcers.  Psychiatric: Flat affect  Labs on Admission: I have personally reviewed following labs and imaging studies  CBC: Recent Labs  Lab 01/08/23 0519  WBC 15.3*  HGB 15.2  HCT 47.1  MCV 81.2  PLT 276   Basic Metabolic Panel: Recent Labs  Lab 01/08/23 0519  NA 136  K 4.4  CL 99  CO2 29  GLUCOSE 138*  BUN 8  CREATININE 0.70  CALCIUM 9.1   GFR: Estimated Creatinine Clearance: 208.9 mL/min (by C-G formula based on SCr of 0.7 mg/dL). Liver Function Tests: Recent Labs  Lab 01/08/23 0519  AST 17  ALT 17  ALKPHOS 79  BILITOT 0.7  PROT 7.8  ALBUMIN 3.8   Recent Labs  Lab 01/08/23 0519  LIPASE 26   No results for input(s): "AMMONIA" in the last 168 hours. Coagulation Profile: No results for input(s): "INR", "PROTIME" in the last 168 hours. Cardiac Enzymes: No results for input(s): "CKTOTAL", "CKMB", "CKMBINDEX", "TROPONINI" in the last 168 hours. BNP (last 3 results) No results for input(s): "PROBNP" in the last 8760 hours. HbA1C: No results for input(s): "HGBA1C" in the last 72 hours. CBG: No results for input(s): "GLUCAP" in the last 168 hours. Lipid Profile: No results for input(s): "CHOL", "HDL", "LDLCALC", "TRIG", "CHOLHDL",  "LDLDIRECT" in the last 72 hours. Thyroid Function Tests: No results for input(s): "TSH", "T4TOTAL", "FREET4", "T3FREE", "THYROIDAB" in the last 72 hours. Anemia Panel: No results for input(s): "VITAMINB12", "FOLATE", "FERRITIN", "TIBC", "IRON", "RETICCTPCT" in the last 72 hours. Urine analysis:    Component Value Date/Time   COLORURINE YELLOW 01/08/2023 0417   APPEARANCEUR CLOUDY (A) 01/08/2023 0417   LABSPEC 1.017 01/08/2023 0417   PHURINE 8.0 01/08/2023 0417   GLUCOSEU NEGATIVE 01/08/2023 0417   HGBUR  NEGATIVE 01/08/2023 0417   BILIRUBINUR NEGATIVE 01/08/2023 0417   KETONESUR NEGATIVE 01/08/2023 0417   PROTEINUR NEGATIVE 01/08/2023 0417   UROBILINOGEN 0.2 05/27/2014 0105   NITRITE NEGATIVE 01/08/2023 0417   LEUKOCYTESUR NEGATIVE 01/08/2023 0417    Radiological Exams on Admission: CT ABDOMEN PELVIS W CONTRAST  Result Date: 01/08/2023 CLINICAL DATA:  Awoke with severe abdominal pain in the left upper quadrant. EXAM: CT ABDOMEN AND PELVIS WITH CONTRAST TECHNIQUE: Multidetector CT imaging of the abdomen and pelvis was performed using the standard protocol following bolus administration of intravenous contrast. RADIATION DOSE REDUCTION: This exam was performed according to the departmental dose-optimization program which includes automated exposure control, adjustment of the mA and/or kV according to patient size and/or use of iterative reconstruction technique. CONTRAST:  OMNIPAQUE IOHEXOL 300 MG/ML  SOLN COMPARISON:  03/21/2015 FINDINGS: Lower chest:  No contributory findings. Hepatobiliary: No focal liver abnormality.Cholecystectomy. No biliary dilatation. Pancreas: Unremarkable. Spleen: Unremarkable. Adrenals/Urinary Tract: Negative adrenals. No hydronephrosis or stone. Unremarkable bladder. Stomach/Bowel: Cluster of fluid distended small bowel loops in the left abdomen with mild mesenteric fat stranding. There is no discrete transition point but other small bowel loops are not  distended. No low-density bowel wall thickening or evidence of mass. No appendicitis Vascular/Lymphatic: No acute vascular abnormality. Major mesenteric vessels are enhancing. No mass or adenopathy. Reproductive:No pathologic findings. Other: No ascites or pneumoperitoneum. Musculoskeletal: No acute abnormalities. IMPRESSION: 1. Cluster of fluid-filled small bowel loops in the left abdomen which could reflect partial obstruction or enteritis. 2. Cholecystectomy. Electronically Signed   By: Tiburcio Pea M.D.   On: 01/08/2023 07:31    Assessment/Plan Principal Problem:   SBO (small bowel obstruction) (HCC) Active Problems:   History of optic neuritis   Obesity (BMI 30-39.9)   Tobacco abuse   Insomnia   Gastroesophageal reflux disease    Partial small bowel obstruction Keep n.p.o. except sips with meds and advance as tolerated Continue IV fluid Zofran as needed for nausea or vomiting Appreciate general surgery evaluation  Leukocytosis reactive in the setting of above Follow CBC  Anxiety/depression/insomnia Continue home medications  Tobacco abuse Counseled on cessation Nicotine patch  Obesity Lifestyle changes outpatient  GERD PPI   DVT prophylaxis: Lovenox Code Status: Full Family Communication: Mother at bedside 7/30 Disposition Plan: Admit for evaluation of partial small bowel obstruction Consults called:General Surgery Admission status: Inpatient, Med/Surg  Severity of Illness: The appropriate patient status for this patient is INPATIENT. Inpatient status is judged to be reasonable and necessary in order to provide the required intensity of service to ensure the patient's safety. The patient's presenting symptoms, physical exam findings, and initial radiographic and laboratory data in the context of their chronic comorbidities is felt to place them at high risk for further clinical deterioration. Furthermore, it is not anticipated that the patient will be medically  stable for discharge from the hospital within 2 midnights of admission.   * I certify that at the point of admission it is my clinical judgment that the patient will require inpatient hospital care spanning beyond 2 midnights from the point of admission due to high intensity of service, high risk for further deterioration and high frequency of surveillance required.*   Avinash Maltos D Raphaela Cannaday DO [facility]  If 7PM-7AM, please contact night-coverage [email or web address]  01/08/2023, 8:34 AM

## 2023-01-08 NOTE — ED Notes (Addendum)
Upon entering patients room to administer 0800 pain medication, RN discovered patient was not in his room RN discovered pt was not in there. Charge and RN found pt in parking lot smoking a cigarette. Pt had carried out bolus of IV fluids and all equipment to the parking lot. RN and CN got patient inside and educated on hospital policies.

## 2023-01-08 NOTE — Consult Note (Addendum)
I was present with the medical student for this service. I personally verified the history of present illness, performed the physical exam, and made the plan for this encounter. I have verified the medical student's documentation and made modifications where appropriately. I have personally documented in my own words a brief history, physical, and plan below.     Acute onset N/& and had BM at 8am. Last vomiting at 8am. Has no family history of Crohn's or UC. Has some history of in the family of cancer. He had a laparoscopic cholecystectomy yin 2016 but nothing else major.   Personally reviewed CT and showed to patient and family- no transition point just some dilated bowel in the LUQ. I think this is more gastroenteritis type picture. No sick contacts. He did think he might of drank some bad milk but reports the date was ok.   Ambulate NPO now, If no vomiting or has BM can get clear diet later If had this type of episode again then would have to consider more extensive workup but this right now looks like enteritis.   Updated team. Can check GI panel but think will be low yield if having regular Bms and not loose stools or diarrhea   Algis Greenhouse, MD [facility] [address] [place] [place], [place] [address] [phone] (office)    Reason for Consult: SBO Referring Physician: [name] is an 37 y.o. male.  HPI: Patient reports that he developed abdominal pain around 10 PM yesterday. Says that it was initially a mild ache but around 3 AM he suddenly developed stabbing pain that caused him to wake up. The pain is at the LUQ and radiates supraumbilically. It waxes and wanes in intensity. He has also had lots of N/V since 3 AM and has already filled up two emesis bags in the ED. The emesis is brown and smelly. His abdominal pain is improving with pain meds in the ED. His last BM was this AM and it was non-watery. He does endorse sick contacts  recently. He has never had this before.  He normally has 2-3 soft, non-bloody Bms every day. He drinks plenty of water. Does not drink alcohol. Does smoke 1 PPD.   No relevant Mhx. Had a cholecystectomy in 2016 and notes that he had abdominal fat stuck in his GB. Extensive Fhx of cancer. No Fhx of IBD. 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  Past Medical History:  Diagnosis Date   Bipolar 1 disorder (HCC)    Depression    Family history of BRCA2 gene positive    Family history of colon cancer    Family history of melanoma    Family history of prostate cancer    Leukocytosis 02/06/2021   Sleep disorder     Past Surgical History:  Procedure Laterality Date   CHOLECYSTECTOMY N/A 03/18/2015   Procedure: LAPAROSCOPIC CHOLECYSTECTOMY;  Surgeon: Franky Macho, MD;  Location: [facility];  Service: General;  Laterality: N/A;   NO PAST SURGERIES      Family History  Problem Relation Age of Onset   Head & neck cancer Father    Heart failure Maternal Aunt    Congestive Heart Failure Maternal Uncle    Melanoma Paternal Aunt 86       x2   Melanoma Paternal Aunt    Prostate cancer Paternal Uncle 48       BRCA2 [phone]*   Heart failure Maternal Grandmother    Heart attack Maternal  Grandfather    Colon cancer Paternal Grandmother    Prostate cancer Paternal Grandfather     Social History:  reports that he has been smoking cigarettes. He has a 10 pack-year smoking history. He has never used smokeless tobacco. He reports current alcohol use. He reports that he does not use drugs.  Allergies: No Known Allergies  Medications: I have reviewed the patient's current medications. Current Facility-Administered Medications  Medication Dose Route Frequency Provider Last Rate Last Admin   acetaminophen (TYLENOL) tablet 650 mg  650 mg Oral Q6H PRN Sherryll Burger, Pratik D, DO       Or   acetaminophen (TYLENOL) suppository 650 mg  650 mg Rectal Q6H PRN Sherryll Burger, Pratik D, DO       enoxaparin (LOVENOX) injection  80 mg  80 mg Subcutaneous Q24H Shah, Pratik D, DO       HYDROmorphone (DILAUDID) injection 0.5-1 mg  0.5-1 mg Intravenous Q2H PRN Sherryll Burger, Pratik D, DO   1 mg at 01/08/23 1146   lactated ringers infusion   Intravenous Continuous Maurilio Lovely D, DO 75 mL/hr at 01/08/23 1610 New Bag at 01/08/23 0906   nicotine (NICODERM CQ - dosed in mg/24 hours) patch 21 mg  21 mg Transdermal Daily Sherryll Burger, Pratik D, DO   21 mg at 01/08/23 0903   ondansetron (ZOFRAN) injection 4 mg  4 mg Intravenous Q6H PRN Maurilio Lovely D, DO   4 mg at 01/08/23 1147   pantoprazole (PROTONIX) injection 40 mg  40 mg Intravenous Q24H Sherryll Burger, Pratik D, DO   40 mg at 01/08/23 9604   promethazine (PHENERGAN) 25 mg in sodium chloride 0.9 % 50 mL IVPB  25 mg Intravenous Q6H PRN Sherryll Burger, Pratik D, DO       QUEtiapine (SEROQUEL) tablet 100 mg  100 mg Oral QHS Sherryll Burger, Pratik D, DO        Results for orders placed or performed during the hospital encounter of 01/08/23 (from the past 48 hour(s))  Urinalysis, Routine w reflex microscopic -Urine, Clean Catch     Status: Abnormal   Collection Time: 01/08/23  4:17 AM  Result Value Ref Range   Color, Urine YELLOW YELLOW   APPearance CLOUDY (A) CLEAR   Specific Gravity, Urine 1.017 1.005 - 1.030   pH 8.0 5.0 - 8.0   Glucose, UA NEGATIVE NEGATIVE mg/dL   Hgb urine dipstick NEGATIVE NEGATIVE   Bilirubin Urine NEGATIVE NEGATIVE   Ketones, ur NEGATIVE NEGATIVE mg/dL   Protein, ur NEGATIVE NEGATIVE mg/dL   Nitrite NEGATIVE NEGATIVE   Leukocytes,Ua NEGATIVE NEGATIVE    Comment: Performed at [facility], [address] [place], [place] [address]  Lipase, blood     Status: None   Collection Time: 01/08/23  5:19 AM  Result Value Ref Range   Lipase 26 11 - 51 U/L    Comment: Performed at [facility], [address], [place], [place] [address]  Comprehensive metabolic panel     Status: Abnormal   Collection Time: 01/08/23  5:19 AM  Result Value Ref Range   Sodium 136 135 - 145 mmol/L   Potassium 4.4 3.5 -  5.1 mmol/L   Chloride 99 98 - 111 mmol/L   CO2 29 22 - 32 mmol/L   Glucose, Bld 138 (H) 70 - 99 mg/dL    Comment: Glucose reference range applies only to samples taken after fasting for at least 8 hours.   BUN 8 6 - 20 mg/dL   Creatinine, Ser 7.82 0.61 - 1.24  mg/dL   Calcium 9.1 8.9 - 40.9 mg/dL   Total Protein 7.8 6.5 - 8.1 g/dL   Albumin 3.8 3.5 - 5.0 g/dL   AST 17 15 - 41 U/L   ALT 17 0 - 44 U/L   Alkaline Phosphatase 79 38 - 126 U/L   Total Bilirubin 0.7 0.3 - 1.2 mg/dL   GFR, Estimated >81 >19 mL/min    Comment: (NOTE) Calculated using the CKD-EPI Creatinine Equation (2021)    Anion gap 8 5 - 15    Comment: Performed at [facility], [address] [place], [place] [address]  CBC     Status: Abnormal   Collection Time: 01/08/23  5:19 AM  Result Value Ref Range   WBC 15.3 (H) 4.0 - 10.5 K/uL   RBC 5.80 4.22 - 5.81 MIL/uL   Hemoglobin 15.2 13.0 - 17.0 g/dL   HCT 95.6 21.3 - 08.6 %   MCV 81.2 80.0 - 100.0 fL   MCH 26.2 26.0 - 34.0 pg   MCHC 32.3 30.0 - 36.0 g/dL   RDW 57.8 46.9 - 62.9 %   Platelets 276 150 - 400 K/uL   nRBC 0.0 0.0 - 0.2 %    Comment: Performed at [facility], [address] [place], [place] [address]    CT ABDOMEN PELVIS W CONTRAST  Result Date: 01/08/2023 CLINICAL DATA:  Awoke with severe abdominal pain in the left upper quadrant. EXAM: CT ABDOMEN AND PELVIS WITH CONTRAST TECHNIQUE: Multidetector CT imaging of the abdomen and pelvis was performed using the standard protocol following bolus administration of intravenous contrast. RADIATION DOSE REDUCTION: This exam was performed according to the departmental dose-optimization program which includes automated exposure control, adjustment of the mA and/or kV according to patient size and/or use of iterative reconstruction technique. CONTRAST:  OMNIPAQUE IOHEXOL 300 MG/ML  SOLN COMPARISON:  03/21/2015 FINDINGS: Lower chest:  No contributory findings. Hepatobiliary: No focal liver  abnormality.Cholecystectomy. No biliary dilatation. Pancreas: Unremarkable. Spleen: Unremarkable. Adrenals/Urinary Tract: Negative adrenals. No hydronephrosis or stone. Unremarkable bladder. Stomach/Bowel: Cluster of fluid distended small bowel loops in the left abdomen with mild mesenteric fat stranding. There is no discrete transition point but other small bowel loops are not distended. No low-density bowel wall thickening or evidence of mass. No appendicitis Vascular/Lymphatic: No acute vascular abnormality. Major mesenteric vessels are enhancing. No mass or adenopathy. Reproductive:No pathologic findings. Other: No ascites or pneumoperitoneum. Musculoskeletal: No acute abnormalities. IMPRESSION: 1. Cluster of fluid-filled small bowel loops in the left abdomen which could reflect partial obstruction or enteritis. 2. Cholecystectomy. Electronically Signed   By: Tiburcio Pea M.D.   On: 01/08/2023 07:31    ROS:  Pertinent items are noted in HPI.  Blood pressure (!) 155/100, pulse 70, temperature 97.6 F (36.4 C), temperature source Oral, resp. rate 11, height 6\' 2"  (1.88 m), weight (!) 163.3 kg, SpO2 94%.  Physical Exam:  General: pleasant and in mild distress Pulm: no increased work of breathing Abdominal: Bowel sounds absent. Soft, nondistended. Moderate tenderness to palpation of the LUQ and across the abdomen supraumbilically; no rebound tenderness or guarding.  Psych: A&O x3  Assessment/Plan: Ian Gonzalez is a 37 y.o. male with Mhx significant for obesity, depression, insomnia, GERD, and tobacco abuse presenting for left-sided abdominal pain and CT evidence of fluid-filled small bowel loops in the left abdomen. No concerns for SBO given lack of transition point. Less likely to be secondary to IBD given normal bowel habits. It is likely that his symptoms are  secondary to enteritis which will resolve with time. Recommend bowel rest, prn pain control and antiemetics, and maintenance  fluids.  -Monitor bowel function -NPO; if no emesis in the evening, can advance to clear liquid diet -mIVF -GI pathogen panel -Electrolyte repletion (Mg >2, Phos >3, K >4) -pain control and antiemetics prn   Dominica Severin 01/08/2023, 9:09 AM

## 2023-01-08 NOTE — ED Notes (Signed)
Notified Robyn on 300 pt status time had surpassed 40 minutes and patient would be arriving on the unit soon.

## 2023-01-09 DIAGNOSIS — K219 Gastro-esophageal reflux disease without esophagitis: Secondary | ICD-10-CM

## 2023-01-09 DIAGNOSIS — E669 Obesity, unspecified: Secondary | ICD-10-CM

## 2023-01-09 DIAGNOSIS — G4700 Insomnia, unspecified: Secondary | ICD-10-CM

## 2023-01-09 MED ORDER — ROPINIROLE HCL 0.25 MG PO TABS
0.2500 mg | ORAL_TABLET | Freq: Once | ORAL | Status: AC | PRN
  Administered 2023-01-09: 0.25 mg via ORAL
  Filled 2023-01-09: qty 1

## 2023-01-09 MED ORDER — MAGNESIUM SULFATE 2 GM/50ML IV SOLN
2.0000 g | Freq: Once | INTRAVENOUS | Status: AC
  Administered 2023-01-09: 2 g via INTRAVENOUS
  Filled 2023-01-09: qty 50

## 2023-01-09 MED ORDER — POTASSIUM CHLORIDE CRYS ER 20 MEQ PO TBCR
40.0000 meq | EXTENDED_RELEASE_TABLET | Freq: Once | ORAL | Status: AC
  Administered 2023-01-09: 40 meq via ORAL
  Filled 2023-01-09: qty 2

## 2023-01-09 NOTE — Discharge Summary (Addendum)
Physician Discharge Summary  Ian Gonzalez GBT:[id number] DOB: August 18, 1985 DOA: 01/08/2023  PCP: Anabel Halon, MD  Admit date: 01/08/2023 Discharge date: 01/09/2023  Admitted From:  Home  Disposition: Home   Recommendations for Outpatient Follow-up:  Follow up with PCP in 1 weeks  Discharge Condition: STABLE   CODE STATUS: FULL DIET: soft foods recommended    Brief Hospitalization Summary: Please see all hospital notes, images, labs for full details of the hospitalization. ADMISSION PROVIDER HPI:  37 y.o. male with medical history significant for obesity, depression/anxiety, insomnia, GERD, and tobacco abuse who presented to the ED with sudden onset upper abdominal pain along with some nausea and vomiting that began last night at approximately 10 PM.  This worsened overnight and into the morning.  He denies any hematemesis and states that the pain radiates to his back and minimally into his chest as well.  He smokes a pack of cigarettes daily but denies alcohol use.  He has had prior history of cholecystectomy about 10 years prior and states that he has had 2 bowel movements while in the ED.   ED Course: Vital signs stable and patient afebrile.  Leukocytosis of 15,300 noted.  Urine analysis within normal limits.  CT of the abdomen pelvis demonstrating signs of partial obstruction versus enteritis.  He has been given some IV fluid and symptomatic management in the ED.  HOSPITAL COURSE Patient was admitted into the hospital for mild case of enteritis vs partial small bowel obstruction that fortunately resolved after supportive measures in the hospital.  He was initially kept NPO.  He was given IV fluids and other supportive measures and electrolyte treatments.  He fortunately started having bowel movements and his diet was advanced and he is tolerated that well.  He is feeling better and eager to discharge home.  He had been seen by the general surgery service but did not require any operative  treatments.  Patient is feeling well and will discharge home to follow-up with his PCP.  Patient advised to seek medical care or return if symptoms recur.  Patient verbalized understanding.  Discharge Diagnoses:  Principal Problem:   SBO (small bowel obstruction) (HCC) Active Problems:   History of optic neuritis   Obesity (BMI 30-39.9)   Tobacco abuse   Insomnia   Gastroesophageal reflux disease   Enteritis   Discharge Instructions:  Allergies as of 01/09/2023   No Known Allergies      Medication List     STOP taking these medications    diazepam 5 MG tablet Commonly known as: Valium   lidocaine 5 % Commonly known as: Lidoderm   naproxen 500 MG tablet Commonly known as: NAPROSYN   tizanidine 2 MG capsule Commonly known as: Zanaflex       TAKE these medications    acetaminophen 325 MG tablet Commonly known as: TYLENOL Take 650 mg by mouth every 6 (six) hours as needed.   fluticasone 50 MCG/ACT nasal spray Commonly known as: FLONASE Place 2 sprays into both nostrils daily.   ibuprofen 200 MG tablet Commonly known as: ADVIL Take 200 mg by mouth every 6 (six) hours as needed.   omeprazole 20 MG capsule Commonly known as: PRILOSEC Take 1 capsule (20 mg total) by mouth daily.   QUEtiapine 100 MG tablet Commonly known as: SEROQUEL TAKE 1 TABLET BY MOUTH AT BEDTIME        Follow-up Information     Anabel Halon, MD. Schedule an appointment as soon as possible  for a visit in 1 week(s).   Specialty: Internal Medicine Why: Hospital Follow Up Contact information: [address] [place] [address] [phone]                No Known Allergies Allergies as of 01/09/2023   No Known Allergies      Medication List     STOP taking these medications    diazepam 5 MG tablet Commonly known as: Valium   lidocaine 5 % Commonly known as: Lidoderm   naproxen 500 MG tablet Commonly known as: NAPROSYN   tizanidine 2 MG  capsule Commonly known as: Zanaflex       TAKE these medications    acetaminophen 325 MG tablet Commonly known as: TYLENOL Take 650 mg by mouth every 6 (six) hours as needed.   fluticasone 50 MCG/ACT nasal spray Commonly known as: FLONASE Place 2 sprays into both nostrils daily.   ibuprofen 200 MG tablet Commonly known as: ADVIL Take 200 mg by mouth every 6 (six) hours as needed.   omeprazole 20 MG capsule Commonly known as: PRILOSEC Take 1 capsule (20 mg total) by mouth daily.   QUEtiapine 100 MG tablet Commonly known as: SEROQUEL TAKE 1 TABLET BY MOUTH AT BEDTIME        Procedures/Studies: CT ABDOMEN PELVIS W CONTRAST  Result Date: 01/08/2023 CLINICAL DATA:  Awoke with severe abdominal pain in the left upper quadrant. EXAM: CT ABDOMEN AND PELVIS WITH CONTRAST TECHNIQUE: Multidetector CT imaging of the abdomen and pelvis was performed using the standard protocol following bolus administration of intravenous contrast. RADIATION DOSE REDUCTION: This exam was performed according to the departmental dose-optimization program which includes automated exposure control, adjustment of the mA and/or kV according to patient size and/or use of iterative reconstruction technique. CONTRAST:  OMNIPAQUE IOHEXOL 300 MG/ML  SOLN COMPARISON:  03/21/2015 FINDINGS: Lower chest:  No contributory findings. Hepatobiliary: No focal liver abnormality.Cholecystectomy. No biliary dilatation. Pancreas: Unremarkable. Spleen: Unremarkable. Adrenals/Urinary Tract: Negative adrenals. No hydronephrosis or stone. Unremarkable bladder. Stomach/Bowel: Cluster of fluid distended small bowel loops in the left abdomen with mild mesenteric fat stranding. There is no discrete transition point but other small bowel loops are not distended. No low-density bowel wall thickening or evidence of mass. No appendicitis Vascular/Lymphatic: No acute vascular abnormality. Major mesenteric vessels are enhancing. No mass or  adenopathy. Reproductive:No pathologic findings. Other: No ascites or pneumoperitoneum. Musculoskeletal: No acute abnormalities. IMPRESSION: 1. Cluster of fluid-filled small bowel loops in the left abdomen which could reflect partial obstruction or enteritis. 2. Cholecystectomy. Electronically Signed   By: Tiburcio Pea M.D.   On: 01/08/2023 07:31     Subjective: Pt reports he is going home, he had 3 bms and tolerating diet, no pain, no n/v.    Discharge Exam: Vitals:   01/08/23 2030 01/09/23 0543  BP: 118/69 136/73  Pulse: 80 62  Resp: 18 17  Temp: 98.6 F (37 C) 98.5 F (36.9 C)  SpO2: 98% 96%   Vitals:   01/08/23 1540 01/08/23 1748 01/08/23 2030 01/09/23 0543  BP: 118/63 100/81 118/69 136/73  Pulse: 68 71 80 62  Resp: 17 18 18 17   Temp:  98 F (36.7 C) 98.6 F (37 C) 98.5 F (36.9 C)  TempSrc:  Oral Oral Oral  SpO2: 99% 100% 98% 96%  Weight:      Height:       General: Pt is alert, awake, not in acute distress Cardiovascular: normal S1/S2 +, no rubs, no gallops  Respiratory: CTA bilaterally, no wheezing, no rhonchi Abdominal: Soft, NT, ND, bowel sounds + Extremities: no edema, no cyanosis   The results of significant diagnostics from this hospitalization (including imaging, microbiology, ancillary and laboratory) are listed below for reference.     Microbiology: No results found for this or any previous visit (from the past 240 hour(s)).   Labs: BNP (last 3 results) No results for input(s): "BNP" in the last 8760 hours. Basic Metabolic Panel: Recent Labs  Lab 01/08/23 0519 01/09/23 0529  NA 136 137  K 4.4 3.6  CL 99 102  CO2 29 28  GLUCOSE 138* 104*  BUN 8 7  CREATININE 0.70 0.66  CALCIUM 9.1 8.5*  MG  --  2.0   Liver Function Tests: Recent Labs  Lab 01/08/23 0519 01/09/23 0529  AST 17 15  ALT 17 14  ALKPHOS 79 60  BILITOT 0.7 0.7  PROT 7.8 6.2*  ALBUMIN 3.8 3.1*   Recent Labs  Lab 01/08/23 0519  LIPASE 26   No results for input(s):  "AMMONIA" in the last 168 hours. CBC: Recent Labs  Lab 01/08/23 0519 01/09/23 0529  WBC 15.3* 7.3  HGB 15.2 12.9*  HCT 47.1 41.3  MCV 81.2 81.9  PLT 276 238   Cardiac Enzymes: No results for input(s): "CKTOTAL", "CKMB", "CKMBINDEX", "TROPONINI" in the last 168 hours. BNP: Invalid input(s): "POCBNP" CBG: No results for input(s): "GLUCAP" in the last 168 hours. D-Dimer No results for input(s): "DDIMER" in the last 72 hours. Hgb A1c No results for input(s): "HGBA1C" in the last 72 hours. Lipid Profile No results for input(s): "CHOL", "HDL", "LDLCALC", "TRIG", "CHOLHDL", "LDLDIRECT" in the last 72 hours. Thyroid function studies No results for input(s): "TSH", "T4TOTAL", "T3FREE", "THYROIDAB" in the last 72 hours.  Invalid input(s): "FREET3" Anemia work up No results for input(s): "VITAMINB12", "FOLATE", "FERRITIN", "TIBC", "IRON", "RETICCTPCT" in the last 72 hours. Urinalysis    Component Value Date/Time   COLORURINE YELLOW 01/08/2023 0417   APPEARANCEUR CLOUDY (A) 01/08/2023 0417   LABSPEC 1.017 01/08/2023 0417   PHURINE 8.0 01/08/2023 0417   GLUCOSEU NEGATIVE 01/08/2023 0417   HGBUR NEGATIVE 01/08/2023 0417   BILIRUBINUR NEGATIVE 01/08/2023 0417   KETONESUR NEGATIVE 01/08/2023 0417   PROTEINUR NEGATIVE 01/08/2023 0417   UROBILINOGEN 0.2 05/27/2014 0105   NITRITE NEGATIVE 01/08/2023 0417   LEUKOCYTESUR NEGATIVE 01/08/2023 0417   Sepsis Labs Recent Labs  Lab 01/08/23 0519 01/09/23 0529  WBC 15.3* 7.3   Microbiology No results found for this or any previous visit (from the past 240 hour(s)).  Time coordinating discharge:  33 mins   SIGNED:  Standley Dakins, MD  [facility] 01/09/2023, 11:59 AM How to contact the [facility] Attending or Consulting provider 7A - 7P or covering provider during after hours 7P -7A, for this patient?  Check the care team in [facility] and look for a) attending/consulting [facility] provider listed and b) the [facility] team listed Log into  [email or web address] and use [facility]'s universal password to access. If you do not have the password, please contact the hospital operator. Locate the [facility] provider you are looking for under [facility] and page to a number that you can be directly reached. If you still have difficulty reaching the provider, please page the [facility] (Director on Call) for the Hospitalists listed on amion for assistance.

## 2023-01-09 NOTE — Progress Notes (Addendum)
Subjective: Patient seen and examined. He is sitting on the edge of the bed. He denies any N/V, or abdominal pain and notes that all his symptoms have completely resolved. He has had two non-bloody, soft Bms this morning and notes feeling hungry.   Objective: Vital signs in last 24 hours: Temp:  [98 F (36.7 C)-98.6 F (37 C)] 98.5 F (36.9 C) (07/31 0543) Pulse Rate:  [62-80] 62 (07/31 0543) Resp:  [17-18] 17 (07/31 0543) BP: (100-136)/(63-81) 136/73 (07/31 0543) SpO2:  [96 %-100 %] 96 % (07/31 0543) Last BM Date : 01/08/23  Intake/Output from previous day: 07/30 0701 - 07/31 0700 In: 2598.7 [I.V.:1548.7; IV Piggyback:1050] Out: -  Intake/Output this shift: No intake/output data recorded.  General: pleasant and well-appearing Pulm: no increased work of breathing Abdominal: Soft, nondistended. No tenderness of palpation, rebound tenderness or guarding Psych: A&O x3   Lab Results:  Recent Labs    01/08/23 0519 01/09/23 0529  WBC 15.3* 7.3  HGB 15.2 12.9*  HCT 47.1 41.3  PLT 276 238   BMET Recent Labs    01/08/23 0519 01/09/23 0529  NA 136 137  K 4.4 3.6  CL 99 102  CO2 29 28  GLUCOSE 138* 104*  BUN 8 7  CREATININE 0.70 0.66  CALCIUM 9.1 8.5*   PT/INR No results for input(s): "LABPROT", "INR" in the last 72 hours.  Studies/Results: CT ABDOMEN PELVIS W CONTRAST  Result Date: 01/08/2023 CLINICAL DATA:  Awoke with severe abdominal pain in the left upper quadrant. EXAM: CT ABDOMEN AND PELVIS WITH CONTRAST TECHNIQUE: Multidetector CT imaging of the abdomen and pelvis was performed using the standard protocol following bolus administration of intravenous contrast. RADIATION DOSE REDUCTION: This exam was performed according to the departmental dose-optimization program which includes automated exposure control, adjustment of the mA and/or kV according to patient size and/or use of iterative reconstruction technique. CONTRAST:  OMNIPAQUE IOHEXOL 300 MG/ML   SOLN COMPARISON:  03/21/2015 FINDINGS: Lower chest:  No contributory findings. Hepatobiliary: No focal liver abnormality.Cholecystectomy. No biliary dilatation. Pancreas: Unremarkable. Spleen: Unremarkable. Adrenals/Urinary Tract: Negative adrenals. No hydronephrosis or stone. Unremarkable bladder. Stomach/Bowel: Cluster of fluid distended small bowel loops in the left abdomen with mild mesenteric fat stranding. There is no discrete transition point but other small bowel loops are not distended. No low-density bowel wall thickening or evidence of mass. No appendicitis Vascular/Lymphatic: No acute vascular abnormality. Major mesenteric vessels are enhancing. No mass or adenopathy. Reproductive:No pathologic findings. Other: No ascites or pneumoperitoneum. Musculoskeletal: No acute abnormalities. IMPRESSION: 1. Cluster of fluid-filled small bowel loops in the left abdomen which could reflect partial obstruction or enteritis. 2. Cholecystectomy. Electronically Signed   By: Tiburcio Pea M.D.   On: 01/08/2023 07:31    Anti-infectives: Anti-infectives (From admission, onward)    None       Assessment/Plan: Ian Gonzalez is a 37 y.o. male with Mhx significant for obesity, depression, insomnia, GERD presenting with left-sided abdominal pain and CT evidence of fluid-filled small bowel loops in the left abdomen likely secondary to enteritis. Patient with complete resolution of his symptoms and normal bowel functioning today. WBC also normalized. Plan to advance diet.   -General surgery will sign off, please call with any questions -Advance to full liquid diet, if tolerates fulls, advance to soft and consider discharge today -Continue to monitor bowel function -Discontinue mIVF -Electrolyte repletion (Mg >2, Phos >3, K >4) -pain control and antiemetics prn    LOS: 1 day    Derek Mound  [place] 01/09/2023

## 2023-01-09 NOTE — Plan of Care (Signed)

## 2023-01-09 NOTE — Discharge Instructions (Signed)
IMPORTANT INFORMATION: PAY CLOSE ATTENTION   PHYSICIAN DISCHARGE INSTRUCTIONS  Follow with Primary care provider  Patel, Rutwik K, MD  and other consultants as instructed by your Hospitalist Physician  SEEK MEDICAL CARE OR RETURN TO EMERGENCY ROOM IF SYMPTOMS COME BACK, WORSEN OR NEW PROBLEM DEVELOPS   Please note: You were cared for by a hospitalist during your hospital stay. Every effort will be made to forward records to your primary care provider.  You can request that your primary care provider send for your hospital records if they have not received them.  Once you are discharged, your primary care physician will handle any further medical issues. Please note that NO REFILLS for any discharge medications will be authorized once you are discharged, as it is imperative that you return to your primary care physician (or establish a relationship with a primary care physician if you do not have one) for your post hospital discharge needs so that they can reassess your need for medications and monitor your lab values.  Please get a complete blood count and chemistry panel checked by your Primary MD at your next visit, and again as instructed by your Primary MD.  Get Medicines reviewed and adjusted: Please take all your medications with you for your next visit with your Primary MD  Laboratory/radiological data: Please request your Primary MD to go over all hospital tests and procedure/radiological results at the follow up, please ask your primary care provider to get all Hospital records sent to his/her office.  In some cases, they will be blood work, cultures and biopsy results pending at the time of your discharge. Please request that your primary care provider follow up on these results.  If you are diabetic, please bring your blood sugar readings with you to your follow up appointment with primary care.    Please call and make your follow up appointments as soon as possible.    Also Note  the following: If you experience worsening of your admission symptoms, develop shortness of breath, life threatening emergency, suicidal or homicidal thoughts you must seek medical attention immediately by calling 911 or calling your MD immediately  if symptoms less severe.  You must read complete instructions/literature along with all the possible adverse reactions/side effects for all the Medicines you take and that have been prescribed to you. Take any new Medicines after you have completely understood and accpet all the possible adverse reactions/side effects.   Do not drive when taking Pain medications or sleeping medications (Benzodiazepines)  Do not take more than prescribed Pain, Sleep and Anxiety Medications. It is not advisable to combine anxiety,sleep and pain medications without talking with your primary care practitioner  Special Instructions: If you have smoked or chewed Tobacco  in the last 2 yrs please stop smoking, stop any regular Alcohol  and or any Recreational drug use.  Wear Seat belts while driving.  Do not drive if taking any narcotic, mind altering or controlled substances or recreational drugs or alcohol.       

## 2023-01-09 NOTE — Plan of Care (Signed)
  Problem: Education: Goal: Knowledge of General Education information will improve Description: Including pain rating scale, medication(s)/side effects and non-pharmacologic comfort measures 01/09/2023 1203 by Sheela Stack, RN Outcome: Adequate for Discharge 01/09/2023 0859 by Sheela Stack, RN Outcome: Progressing   Problem: Health Behavior/Discharge Planning: Goal: Ability to manage health-related needs will improve 01/09/2023 1203 by Sheela Stack, RN Outcome: Adequate for Discharge 01/09/2023 0859 by Sheela Stack, RN Outcome: Progressing   Problem: Clinical Measurements: Goal: Ability to maintain clinical measurements within normal limits will improve 01/09/2023 1203 by Sheela Stack, RN Outcome: Adequate for Discharge 01/09/2023 0859 by Sheela Stack, RN Outcome: Progressing Goal: Will remain free from infection 01/09/2023 1203 by Sheela Stack, RN Outcome: Adequate for Discharge 01/09/2023 0859 by Sheela Stack, RN Outcome: Progressing Goal: Diagnostic test results will improve 01/09/2023 1203 by Sheela Stack, RN Outcome: Adequate for Discharge 01/09/2023 0859 by Sheela Stack, RN Outcome: Progressing Goal: Respiratory complications will improve 01/09/2023 1203 by Sheela Stack, RN Outcome: Adequate for Discharge 01/09/2023 0859 by Sheela Stack, RN Outcome: Progressing Goal: Cardiovascular complication will be avoided 01/09/2023 1203 by Sheela Stack, RN Outcome: Adequate for Discharge 01/09/2023 0859 by Sheela Stack, RN Outcome: Progressing   Problem: Activity: Goal: Risk for activity intolerance will decrease 01/09/2023 1203 by Sheela Stack, RN Outcome: Adequate for Discharge 01/09/2023 0859 by Sheela Stack, RN Outcome: Progressing   Problem: Nutrition: Goal: Adequate nutrition will be maintained 01/09/2023 1203 by Sheela Stack, RN Outcome: Adequate for Discharge 01/09/2023 0859 by Sheela Stack, RN Outcome: Progressing    Problem: Coping: Goal: Level of anxiety will decrease 01/09/2023 1203 by Sheela Stack, RN Outcome: Adequate for Discharge 01/09/2023 0859 by Sheela Stack, RN Outcome: Progressing   Problem: Elimination: Goal: Will not experience complications related to bowel motility 01/09/2023 1203 by Sheela Stack, RN Outcome: Adequate for Discharge 01/09/2023 0859 by Sheela Stack, RN Outcome: Progressing Goal: Will not experience complications related to urinary retention 01/09/2023 1203 by Sheela Stack, RN Outcome: Adequate for Discharge 01/09/2023 0859 by Sheela Stack, RN Outcome: Progressing   Problem: Pain Managment: Goal: General experience of comfort will improve 01/09/2023 1203 by Sheela Stack, RN Outcome: Adequate for Discharge 01/09/2023 0859 by Sheela Stack, RN Outcome: Progressing   Problem: Safety: Goal: Ability to remain free from injury will improve 01/09/2023 1203 by Sheela Stack, RN Outcome: Adequate for Discharge 01/09/2023 0859 by Sheela Stack, RN Outcome: Progressing   Problem: Skin Integrity: Goal: Risk for impaired skin integrity will decrease 01/09/2023 1203 by Sheela Stack, RN Outcome: Adequate for Discharge 01/09/2023 0859 by Sheela Stack, RN Outcome: Progressing

## 2023-01-10 ENCOUNTER — Telehealth: Payer: Self-pay

## 2023-01-10 NOTE — Transitions of Care (Post Inpatient/ED Visit) (Signed)
   01/10/2023  Name: Ian Gonzalez MRN: [id number] DOB: 1985/11/15  Today's TOC FU Call Status: Today's TOC FU Call Status:: Unsuccessful Call (1st Attempt) Unsuccessful Call (1st Attempt) Date: 01/10/23  Attempted to reach the patient regarding the most recent Inpatient/ED visit.  Follow Up Plan: Additional outreach attempts will be made to reach the patient to complete the Transitions of Care (Post Inpatient/ED visit) call.   Signature Karena Addison, LPN [facility] Nurse Health Advisor Direct Dial [phone]

## 2023-01-14 NOTE — Transitions of Care (Post Inpatient/ED Visit) (Unsigned)
   01/14/2023  Name: Ian Gonzalez MRN: [id number] DOB: 11-19-1985  Today's TOC FU Call Status: Today's TOC FU Call Status:: Successful TOC FU Call Completed Unsuccessful Call (1st Attempt) Date: 01/10/23 [facility] FU Call Complete Date: 01/14/23  Transition Care Management Follow-up Telephone Call Date of Discharge: 01/09/23 [facility] Type of Discharge: Inpatient Admission Primary Inpatient Discharge Diagnosis:: intestinal obstruction How have you been since you were released from the hospital?: Better Any questions or concerns?: No  Items Reviewed: Did you receive and understand the discharge instructions provided?: Yes Medications obtained,verified, and reconciled?: Yes (Medications Reviewed) Any new allergies since your discharge?: No Dietary orders reviewed?: Yes Do you have support at home?: Yes People in Home: parent(s)  Medications Reviewed Today: Medications Reviewed Today     Reviewed by Karena Addison, LPN (Licensed Practical Nurse) on 01/14/23 at 1602  Med List Status: <None>   Medication Order Taking? Sig Documenting Provider Last Dose Status Informant  acetaminophen (TYLENOL) 325 MG tablet [id number] No Take 650 mg by mouth every 6 (six) hours as needed. [provider] Taking Active   fluticasone (FLONASE) 50 MCG/ACT nasal spray [id number] No Place 2 sprays into both nostrils daily. Domenick Gong, MD Taking Active   ibuprofen (ADVIL) 200 MG tablet [id number] No Take 200 mg by mouth every 6 (six) hours as needed. [provider] Taking Active   omeprazole (PRILOSEC) 20 MG capsule [id number] No Take 1 capsule (20 mg total) by mouth daily. Anabel Halon, MD Taking Active   QUEtiapine (SEROQUEL) 100 MG tablet [id number]  TAKE 1 TABLET BY MOUTH AT BEDTIME Anabel Halon, MD  Active             Home Care and Equipment/Supplies: Were Home Health Services Ordered?: NA Any new equipment or medical supplies ordered?:  NA  Functional Questionnaire: Do you need assistance with bathing/showering or dressing?: No Do you need assistance with meal preparation?: No Do you need assistance with eating?: No Do you have difficulty maintaining continence: No Do you need assistance with getting out of bed/getting out of a chair/moving?: No Do you have difficulty managing or taking your medications?: No  Follow up appointments reviewed: PCP Follow-up appointment confirmed?: No (no avail appts, sent message to staff to schedule) MD Provider Line Number:[phone] Given: No Specialist Hospital Follow-up appointment confirmed?: NA Do you need transportation to your follow-up appointment?: No Do you understand care options if your condition(s) worsen?: Yes-patient verbalized understanding    SIGNATURE Karena Addison, LPN [facility] Nurse Health Advisor Direct Dial [phone]

## 2023-01-23 ENCOUNTER — Inpatient Hospital Stay: Payer: Self-pay | Admitting: Internal Medicine

## 2023-03-02 ENCOUNTER — Ambulatory Visit
Admission: EM | Admit: 2023-03-02 | Discharge: 2023-03-02 | Disposition: A | Discharge status: Home / Self Care | Payer: Self-pay | Attending: Family Medicine | Admitting: Family Medicine

## 2023-03-02 ENCOUNTER — Ambulatory Visit (INDEPENDENT_AMBULATORY_CARE_PROVIDER_SITE_OTHER): Payer: Self-pay

## 2023-03-02 ENCOUNTER — Other Ambulatory Visit: Payer: Self-pay

## 2023-03-02 ENCOUNTER — Encounter: Payer: Self-pay | Admitting: Emergency Medicine

## 2023-03-02 DIAGNOSIS — S93401A Sprain of unspecified ligament of right ankle, initial encounter: Secondary | ICD-10-CM

## 2023-03-02 DIAGNOSIS — M79671 Pain in right foot: Secondary | ICD-10-CM

## 2023-03-02 NOTE — ED Triage Notes (Addendum)
Pt reports right foot pain ever since missing step to get into truck bed on thursday. Pt reports right foot pain and swelling ever since. Pt noted to be using crutches.

## 2023-03-02 NOTE — ED Provider Notes (Signed)
[facility]    CSN: [id number] Arrival date & time: 03/02/23  0801      History   Chief Complaint Chief Complaint  Patient presents with   Foot Pain    HPI Ian Gonzalez is a 37 y.o. male.   Patient presenting today with 3-day history of right medial ankle pain, swelling after stepping off a truck wrong.  Denies skin injury, numbness, tingling, discoloration but having difficulty weightbearing.  Does have some movement but painful to do so.  Taking over-the-counter pain relievers with mild temporary benefit.    Past Medical History:  Diagnosis Date   Bipolar 1 disorder (HCC)    Depression    Family history of BRCA2 gene positive    Family history of colon cancer    Family history of melanoma    Family history of prostate cancer    Leukocytosis 02/06/2021   Sleep disorder     Patient Active Problem List   Diagnosis Date Noted   SBO (small bowel obstruction) (HCC) 01/08/2023   Enteritis 01/08/2023   Genetic testing 07/18/2022   Family history of prostate cancer 07/04/2022   Family history of colon cancer 07/04/2022   Family history of melanoma 07/04/2022   Family history of BRCA2 gene positive 07/04/2022   Encounter for general adult medical examination with abnormal findings 02/21/2022   Insomnia 05/19/2021   Gastroesophageal reflux disease 05/19/2021   Subclinical hyperthyroidism 05/19/2021   PSVT (paroxysmal supraventricular tachycardia) 02/08/2021   History of optic neuritis 02/06/2021   Obesity (BMI 30-39.9) 02/06/2021   Tobacco abuse 02/06/2021    Past Surgical History:  Procedure Laterality Date   CHOLECYSTECTOMY N/A 03/18/2015   Procedure: LAPAROSCOPIC CHOLECYSTECTOMY;  Surgeon: Franky Macho, MD;  Location: [facility];  Service: General;  Laterality: N/A;   NO PAST SURGERIES         Home Medications    Prior to Admission medications   Medication Sig Start Date End Date Taking? Authorizing Provider  acetaminophen (TYLENOL) 325 MG tablet  Take 650 mg by mouth every 6 (six) hours as needed.    [provider]  fluticasone (FLONASE) 50 MCG/ACT nasal spray Place 2 sprays into both nostrils daily. 03/26/21   Domenick Gong, MD  ibuprofen (ADVIL) 200 MG tablet Take 200 mg by mouth every 6 (six) hours as needed.    [provider]  omeprazole (PRILOSEC) 20 MG capsule Take 1 capsule (20 mg total) by mouth daily. 06/26/21   Anabel Halon, MD  QUEtiapine (SEROQUEL) 100 MG tablet TAKE 1 TABLET BY MOUTH AT BEDTIME 08/13/22   Anabel Halon, MD    Family History Family History  Problem Relation Age of Onset   Head & neck cancer Father    Heart failure Maternal Aunt    Congestive Heart Failure Maternal Uncle    Melanoma Paternal Aunt 8       x2   Melanoma Paternal Aunt    Prostate cancer Paternal Uncle 27       BRCA2 [phone]*   Heart failure Maternal Grandmother    Heart attack Maternal Grandfather    Colon cancer Paternal Grandmother    Prostate cancer Paternal Grandfather     Social History Social History   Tobacco Use   Smoking status: Every Day    Current packs/day: 1.00    Average packs/day: 1 pack/day for 10.0 years (10.0 ttl pk-yrs)    Types: Cigarettes   Smokeless tobacco: Never  Vaping Use   Vaping status: Some  Days  Substance Use Topics   Alcohol use: Yes    Comment: Occassionally    Drug use: No     Allergies   Patient has no known allergies.   Review of Systems Review of Systems Per HPI  Physical Exam Triage Vital Signs ED Triage Vitals [03/02/23 0809]  Encounter Vitals Group     BP 138/79     Systolic BP Percentile      Diastolic BP Percentile      Pulse Rate 85     Resp 20     Temp 98 F (36.7 C)     Temp Source Oral     SpO2 93 %     Weight      Height      Head Circumference      Peak Flow      Pain Score 8     Pain Loc      Pain Education      Exclude from Growth Chart    No data found.  Updated Vital Signs BP 138/79 (BP Location: Right Arm)   Pulse 85    Temp 98 F (36.7 C) (Oral)   Resp 20   SpO2 93%   Visual Acuity Right Eye Distance:   Left Eye Distance:   Bilateral Distance:    Right Eye Near:   Left Eye Near:    Bilateral Near:     Physical Exam Vitals and nursing note reviewed.  Constitutional:      Appearance: Normal appearance.  HENT:     Head: Atraumatic.  Eyes:     Extraocular Movements: Extraocular movements intact.     Conjunctiva/sclera: Conjunctivae normal.  Cardiovascular:     Rate and Rhythm: Normal rate and regular rhythm.  Pulmonary:     Effort: Pulmonary effort is normal.     Breath sounds: Normal breath sounds.  Musculoskeletal:        General: Tenderness and signs of injury present. No deformity.     Cervical back: Normal range of motion and neck supple.     Comments: Currently in wheelchair due to ankle pain.  Range of motion of the right ankle intact, tender to palpation on the medial aspect of the right ankle.  Trace edema in this area  Skin:    General: Skin is warm and dry.  Neurological:     General: No focal deficit present.     Mental Status: He is oriented to person, place, and time.     Comments: Right lower extremity neurovascularly intact  Psychiatric:        Mood and Affect: Mood normal.        Thought Content: Thought content normal.        Judgment: Judgment normal.      UC Treatments / Results  Labs (all labs ordered are listed, but only abnormal results are displayed) Labs Reviewed - No data to display  EKG   Radiology DG Foot Complete Right  Result Date: 03/02/2023 CLINICAL DATA:  right foot/ankle pain since thursday EXAM: RIGHT FOOT COMPLETE - 3+ VIEW COMPARISON:  08/19/2003. FINDINGS: Bone mineralization within normal limits for patient's age. No acute fracture or dislocation. No aggressive osseous lesion. Ankle mortise appears intact. Calcaneal spur noted along the Plantar aponeurosis attachment site. No focal soft tissue swelling. No radiopaque foreign bodies.  IMPRESSION: No acute fracture or dislocation. Calcaneal spur. Electronically Signed   By: Jules Schick M.D.   On: 03/02/2023 08:41    Procedures Procedures (  including critical care time)  Medications Ordered in UC Medications - No data to display  Initial Impression / Assessment and Plan / UC Course  I have reviewed the triage vital signs and the nursing notes.  Pertinent labs & imaging results that were available during my care of the patient were reviewed by me and considered in my medical decision making (see chart for details).     X-ray of the right ankle negative for acute bony abnormality.  Ace wrap applied, discussed RICE protocol, supportive over-the-counter medications and follow-up precautions.  Work note given. Final Clinical Impressions(s) / UC Diagnoses   Final diagnoses:  Sprain of right ankle, unspecified ligament, initial encounter   Discharge Instructions   None    ED Prescriptions   None    PDMP not reviewed this encounter.   Roosvelt Maser [place], [place] 03/02/23 [phone]

## 2023-03-12 ENCOUNTER — Ambulatory Visit: Payer: Self-pay

## 2023-03-12 ENCOUNTER — Ambulatory Visit
Admission: EM | Admit: 2023-03-12 | Discharge: 2023-03-12 | Disposition: A | Discharge status: Home / Self Care | Payer: Self-pay | Attending: Nurse Practitioner | Admitting: Nurse Practitioner

## 2023-03-12 DIAGNOSIS — S93401D Sprain of unspecified ligament of right ankle, subsequent encounter: Secondary | ICD-10-CM

## 2023-03-12 NOTE — ED Triage Notes (Addendum)
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 Pt was seen here on 03/02/2023 for the same symptoms

## 2023-03-12 NOTE — ED Provider Notes (Signed)
[facility]    CSN: [id number] Arrival date & time: 03/12/23  1214      History   Chief Complaint No chief complaint on file.   HPI Ian Gonzalez is a 37 y.o. male.   Patient presents today with ongoing right ankle pain.  Reports he was seen a couple of weeks ago after he stepped wrong off of the truck at work and had an x-ray of his foot that was negative.  Reports he wore an Ace wrap and rested for couple of days and the pain and swelling improved all the way but it recurred approximately 3 days ago.  Reports he is swelling around his entire ankle and is painful to bear weight.  No numbness or tingling in the toes.  No new injury that he knows of.    Past Medical History:  Diagnosis Date   Bipolar 1 disorder (HCC)    Depression    Family history of BRCA2 gene positive    Family history of colon cancer    Family history of melanoma    Family history of prostate cancer    Leukocytosis 02/06/2021   Sleep disorder     Patient Active Problem List   Diagnosis Date Noted   SBO (small bowel obstruction) (HCC) 01/08/2023   Enteritis 01/08/2023   Genetic testing 07/18/2022   Family history of prostate cancer 07/04/2022   Family history of colon cancer 07/04/2022   Family history of melanoma 07/04/2022   Family history of BRCA2 gene positive 07/04/2022   Encounter for general adult medical examination with abnormal findings 02/21/2022   Insomnia 05/19/2021   Gastroesophageal reflux disease 05/19/2021   Subclinical hyperthyroidism 05/19/2021   PSVT (paroxysmal supraventricular tachycardia) (HCC) 02/08/2021   History of optic neuritis 02/06/2021   Obesity (BMI 30-39.9) 02/06/2021   Tobacco abuse 02/06/2021    Past Surgical History:  Procedure Laterality Date   CHOLECYSTECTOMY N/A 03/18/2015   Procedure: LAPAROSCOPIC CHOLECYSTECTOMY;  Surgeon: Franky Macho, MD;  Location: [facility];  Service: General;  Laterality: N/A;   NO PAST SURGERIES         Home  Medications    Prior to Admission medications   Medication Sig Start Date End Date Taking? Authorizing Provider  acetaminophen (TYLENOL) 325 MG tablet Take 650 mg by mouth every 6 (six) hours as needed.    [provider]  fluticasone (FLONASE) 50 MCG/ACT nasal spray Place 2 sprays into both nostrils daily. 03/26/21   Domenick Gong, MD  ibuprofen (ADVIL) 200 MG tablet Take 200 mg by mouth every 6 (six) hours as needed.    [provider]  omeprazole (PRILOSEC) 20 MG capsule Take 1 capsule (20 mg total) by mouth daily. 06/26/21   Anabel Halon, MD  QUEtiapine (SEROQUEL) 100 MG tablet TAKE 1 TABLET BY MOUTH AT BEDTIME 08/13/22   Anabel Halon, MD    Family History Family History  Problem Relation Age of Onset   Head & neck cancer Father    Heart failure Maternal Aunt    Congestive Heart Failure Maternal Uncle    Melanoma Paternal Aunt 2       x2   Melanoma Paternal Aunt    Prostate cancer Paternal Uncle 24       BRCA2 [phone]*   Heart failure Maternal Grandmother    Heart attack Maternal Grandfather    Colon cancer Paternal Grandmother    Prostate cancer Paternal Grandfather     Social History Social History  Tobacco Use   Smoking status: Every Day    Current packs/day: 1.00    Average packs/day: 1 pack/day for 10.0 years (10.0 ttl pk-yrs)    Types: Cigarettes   Smokeless tobacco: Never  Vaping Use   Vaping status: Some Days  Substance Use Topics   Alcohol use: Yes    Comment: Occassionally    Drug use: No     Allergies   Patient has no known allergies.   Review of Systems Review of Systems Per HPI  Physical Exam Triage Vital Signs ED Triage Vitals  Encounter Vitals Group     BP 03/12/23 1224 122/79     Systolic BP Percentile --      Diastolic BP Percentile --      Pulse Rate 03/12/23 1224 87     Resp 03/12/23 1224 18     Temp 03/12/23 1224 98.7 F (37.1 C)     Temp Source 03/12/23 1224 Oral     SpO2 03/12/23 1224 98 %     Weight  --      Height --      Head Circumference --      Peak Flow --      Pain Score 03/12/23 1228 10     Pain Loc --      Pain Education --      Exclude from Growth Chart --    No data found.  Updated Vital Signs BP 122/79 (BP Location: Right Arm)   Pulse 87   Temp 98.7 F (37.1 C) (Oral)   Resp 18   SpO2 98%   Visual Acuity Right Eye Distance:   Left Eye Distance:   Bilateral Distance:    Right Eye Near:   Left Eye Near:    Bilateral Near:     Physical Exam Vitals and nursing note reviewed.  Constitutional:      General: He is not in acute distress.    Appearance: Normal appearance. He is not toxic-appearing.  Pulmonary:     Effort: Pulmonary effort is normal. No respiratory distress.  Musculoskeletal:     Comments: Inspection: Mild swelling to the medial and lateral malleolus without bruising, obvious deformity or redness Palpation: Lateral and medial malleolus are tender to palpation diffusely; no obvious deformities palpated ROM: Full ROM to right foot, ankle Strength: 5/5 bilateral lower extremities Neurovascular: neurovascularly intact in bilateral lower extremities  Skin:    General: Skin is warm and dry.     Capillary Refill: Capillary refill takes less than 2 seconds.     Coloration: Skin is not jaundiced or pale.     Findings: No erythema.  Neurological:     Mental Status: He is alert and oriented to person, place, and time.  Psychiatric:        Behavior: Behavior is cooperative.      UC Treatments / Results  Labs (all labs ordered are listed, but only abnormal results are displayed) Labs Reviewed - No data to display  EKG   Radiology DG Ankle Complete Right  Result Date: 03/12/2023 CLINICAL DATA:  Right ankle and foot pain and swelling. Injured foot at work climbing on the back of a truck and slipped and landed on the ground to thoraces ago. EXAM: RIGHT ANKLE - COMPLETE 3+ VIEW COMPARISON:  Right foot radiographs 03/02/2023 FINDINGS: The ankle  mortise is symmetric and intact. Mild-to-moderate plantar calcaneal heel spur, unchanged. Mild dorsal navicular-cuneiform degenerative osteophytes on lateral view, unchanged. No acute fracture dislocation. IMPRESSION: 1. No  acute fracture or dislocation. 2. Mild-to-moderate plantar calcaneal heel spur. Electronically Signed   By: Neita Garnet M.D.   On: 03/12/2023 13:13    Procedures Procedures (including critical care time)  Medications Ordered in UC Medications - No data to display  Initial Impression / Assessment and Plan / UC Course  I have reviewed the triage vital signs and the nursing notes.  Pertinent labs & imaging results that were available during my care of the patient were reviewed by me and considered in my medical decision making (see chart for details).   Patient is well-appearing, normotensive, afebrile, not tachycardic, not tachypneic, oxygenating well on room air.   1. Sprain of right ankle, unspecified ligament, subsequent encounter X-ray imaging of ankle today is negative for acute bony abnormality Suspect ongoing sprain of right ankle, treat with ASO lace up brace, rehab exercises given, continue to rest and ice and elevate Recommended follow-up with podiatry if symptoms do not improve with this treatment Work excuse provided  The patient was given the opportunity to ask questions.  All questions answered to their satisfaction.  The patient is in agreement to this plan.    Final Clinical Impressions(s) / UC Diagnoses   Final diagnoses:  Sprain of right ankle, unspecified ligament, subsequent encounter     Discharge Instructions      The ankle x-ray today does not show any broken bones.  Please wear the ASO ankle brace until the pain fully improves.  Try to keep your foot elevated when sitting down, apply ice 15 minutes on, 45 minutes off, and start the ankle sprain rehab exercises I have attached in the packet.  Recommend follow-up with podiatry symptoms do not  improve with this treatment.    ED Prescriptions   None    PDMP not reviewed this encounter.   Valentino Nose, NP 03/12/23 1339

## 2023-03-12 NOTE — Discharge Instructions (Addendum)
The ankle x-ray today does not show any broken bones.  Please wear the ASO ankle brace until the pain fully improves.  Try to keep your foot elevated when sitting down, apply ice 15 minutes on, 45 minutes off, and start the ankle sprain rehab exercises I have attached in the packet.  Recommend follow-up with podiatry symptoms do not improve with this treatment.

## 2023-04-02 ENCOUNTER — Emergency Department (HOSPITAL_COMMUNITY)
Admission: EM | Admit: 2023-04-02 | Discharge: 2023-04-02 | Disposition: A | Discharge status: Home / Self Care | Payer: Self-pay | Attending: Student | Admitting: Student

## 2023-04-02 ENCOUNTER — Other Ambulatory Visit: Payer: Self-pay

## 2023-04-02 ENCOUNTER — Encounter (HOSPITAL_COMMUNITY): Payer: Self-pay

## 2023-04-02 ENCOUNTER — Emergency Department (HOSPITAL_COMMUNITY): Payer: Self-pay

## 2023-04-02 DIAGNOSIS — M7731 Calcaneal spur, right foot: Secondary | ICD-10-CM | POA: Insufficient documentation

## 2023-04-02 DIAGNOSIS — M25571 Pain in right ankle and joints of right foot: Secondary | ICD-10-CM

## 2023-04-02 MED ORDER — NAPROXEN 500 MG PO TABS
500.0000 mg | ORAL_TABLET | Freq: Two times a day (BID) | ORAL | 0 refills | Status: DC
Start: 2023-04-02 — End: 2023-06-25

## 2023-04-02 NOTE — ED Triage Notes (Signed)
Pt states he injured his right ankle about a month ago. They told him it was a sprain, was seen again 2 weeks ago.  They gave him a boot to wear but it isn't improving.  Reports pain with weight.

## 2023-04-02 NOTE — Discharge Instructions (Signed)
Your x-ray today did not show evidence of a fracture or dislocation.  You do have a large heel spur that may be causing some of your pain.  I recommend that you wrap your ankle while standing or walking.  Elevate apply ice on and off when possible.  Take the medication as directed.  Please call the orthopedic provider listed to arrange follow-up appointment.

## 2023-04-02 NOTE — ED Provider Notes (Signed)
[facility] Provider Note   CSN: [id number] Arrival date & time: 04/02/23  1443     History  Chief Complaint  Patient presents with   Ankle Pain    Ian Gonzalez is a 37 y.o. male.   Ankle Pain Associated symptoms: no fever        Ian Gonzalez is a 37 y.o. male bipolar disorder, who presents to the Emergency Department complaining of right ankle pain x 1 month.  States pain began after he slipped off the back of a pickup truck landing on his feet.  Did not specifically have ankle pain at the time, but developed gradually worsening pain several days later.  Pain has persisted worse with standing or walking.  He describes having "stiffness" of his ankle in the morning upon waking gradually improves throughout the day.  Denies any pain of his foot for numbness or tingling.  No swelling.  Seen at urgent care and had x-ray earlier in the month without evidence of fracture.  Continues to have pain.    Home Medications Prior to Admission medications   Medication Sig Start Date End Date Taking? Authorizing Provider  acetaminophen (TYLENOL) 325 MG tablet Take 650 mg by mouth every 6 (six) hours as needed.    [provider]  fluticasone (FLONASE) 50 MCG/ACT nasal spray Place 2 sprays into both nostrils daily. 03/26/21   Domenick Gong, MD  ibuprofen (ADVIL) 200 MG tablet Take 200 mg by mouth every 6 (six) hours as needed.    [provider]  omeprazole (PRILOSEC) 20 MG capsule Take 1 capsule (20 mg total) by mouth daily. 06/26/21   Anabel Halon, MD  QUEtiapine (SEROQUEL) 100 MG tablet TAKE 1 TABLET BY MOUTH AT BEDTIME 08/13/22   Anabel Halon, MD      Allergies    Patient has no known allergies.    Review of Systems   Review of Systems  Constitutional:  Negative for appetite change and fever.  Respiratory:  Negative for shortness of breath.   Cardiovascular:  Negative for chest pain.  Musculoskeletal:   Positive for arthralgias (right ankle pain). Negative for joint swelling.  Skin:  Negative for color change, rash and wound.  Neurological:  Negative for weakness, numbness and headaches.  Psychiatric/Behavioral:  Negative for confusion.     Physical Exam Updated Vital Signs BP 125/84   Pulse 75   Temp 98.5 F (36.9 C) (Oral)   Ht 6\' 2"  (1.88 m)   Wt (!) 154.2 kg   SpO2 98%   BMI 43.65 kg/m  Physical Exam Vitals and nursing note reviewed.  Constitutional:      General: He is not in acute distress.    Appearance: Normal appearance. He is not toxic-appearing.  Cardiovascular:     Rate and Rhythm: Normal rate and regular rhythm.     Pulses: Normal pulses.  Pulmonary:     Effort: Pulmonary effort is normal.  Musculoskeletal:     Comments: Ttp of medial aspect of the right ankle.  No edema.  No proximal tenderness.  No tenderness to posterior heel.  Pes planus right foot tender to the  Skin:    General: Skin is warm.     Capillary Refill: Capillary refill takes less than 2 seconds.  Neurological:     General: No focal deficit present.     Mental Status: He is alert.     Sensory: No sensory deficit.  Motor: No weakness.     ED Results / Procedures / Treatments   Labs (all labs ordered are listed, but only abnormal results are displayed) Labs Reviewed - No data to display  EKG None  Radiology DG Ankle Complete Right  Result Date: 04/02/2023 CLINICAL DATA:  Injury to right ankle 1 month ago.  Pain. EXAM: RIGHT ANKLE - COMPLETE 3+ VIEW COMPARISON:  Radiograph 03/12/2023 FINDINGS: No acute or healing fracture. No dislocation, the alignment is normal. Ankle mortise is preserved. No erosive change or focal bone abnormality. There is mild lateral soft tissue edema. Moderate plantar calcaneal spur again seen. IMPRESSION: Mild lateral soft tissue edema. No acute or healing fracture. Electronically Signed   By: Narda Rutherford M.D.   On: 04/02/2023 17:57     Procedures Procedures    Medications Ordered in ED Medications - No data to display  ED Course/ Medical Decision Making/ A&P                                 Medical Decision Making Outpatient medial aspect right ankle.  No edema.  Neurovascularly intact.  Injury of the ankle 1 month ago continues to have pain.  Low clinical suspicion for acute fracture.  Possible stress fracture initially.  I had x-ray at urgent care that was reviewed by me.  No fracture or dislocation moderate calcaneal spur noted.  Patient does have significant pes planus of the right foot.  Occult fracture, plantar fasciitis, pain from calcaneal spur all considered.  Compartments of the extremity are soft.  Amount and/or Complexity of Data Reviewed Radiology: ordered.    Details: X-ray of the ankle without acute bony finding.  Calcaneal spur again noted Discussion of management or test interpretation with external provider(s): Patient has ASO at home.  Requesting brace that he can wear over his steel toed shoes for work.  Ace wrap was applied.  Patient agreeable to symptomatic treatment.  Prescription for NSAID.  I recommended orthopedic follow-up if continues to have pain.           Final Clinical Impression(s) / ED Diagnoses Final diagnoses:  Acute right ankle pain  Calcaneal spur of right foot    Rx / DC Orders ED Discharge Orders     None         Pauline Aus, PA-C 04/02/23 1831    Glendora Score, MD 04/03/23 1332

## 2023-05-15 IMAGING — MR MR THORACIC SPINE W/O CM
5 of 6 series · 25 of 48 positions shown · non-contrast
Comparison: None.

CLINICAL DATA: Demyelinating disease.

EXAM:
MRI CERVICAL AND THORACIC SPINE WITHOUT CONTRAST
TECHNIQUE: Multiplanar and multiecho pulse sequences of the cervical spine, to
include the craniocervical junction and cervicothoracic junction,
and the thoracic spine, were obtained without intravenous contrast.

[Series 18: T1 · sagittal · 6.0mm · 1.56mm/px · 2 of 8 slices shown (1 of 2)]
[im 1/8]
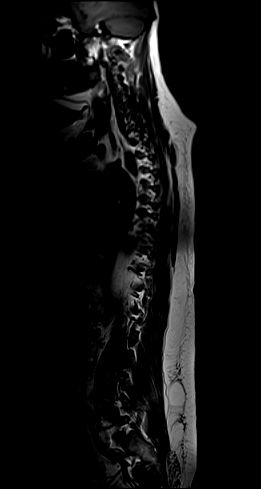
[im 8/8]
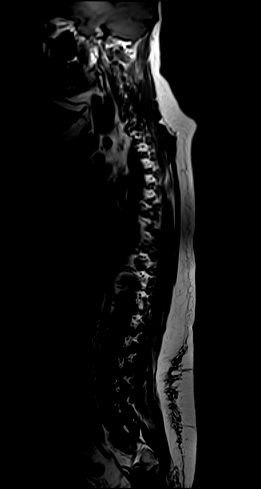

[Series 19: T2 · sagittal · 3.0mm · 1.02mm/px · 6 of 17 slices shown (1 of 2)]
[im 1/17]
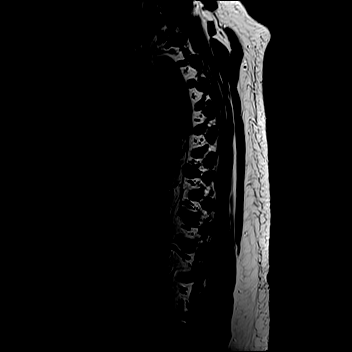
[im 4/17]
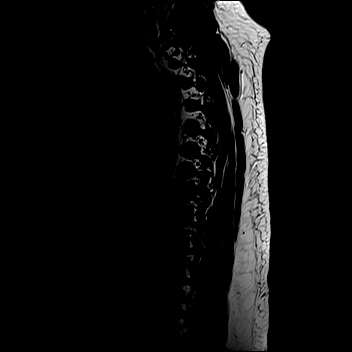
[im 7/17]
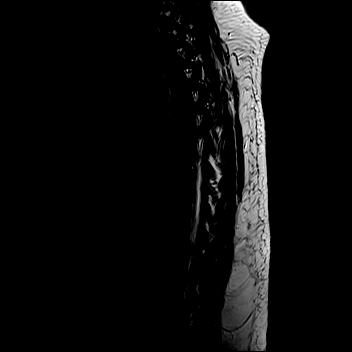
[im 10/17]
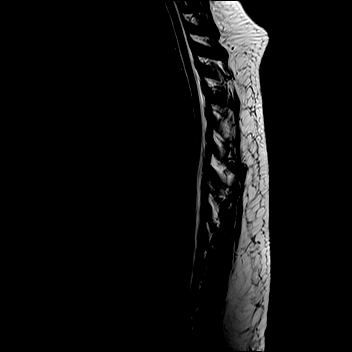
[im 13/17]
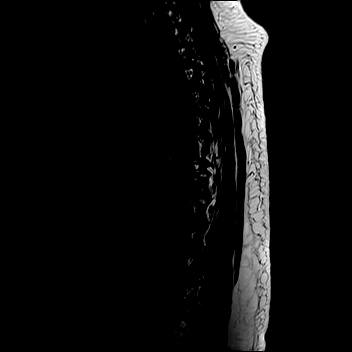
[im 17/17]
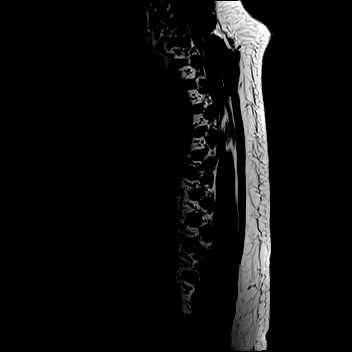

[Series 20: T1 · sagittal · 3.0mm · 0.83mm/px · 6 of 17 slices shown (2 of 2)]
[im 1/17]
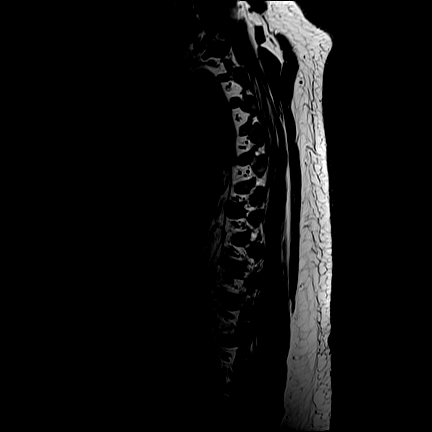
[im 4/17]
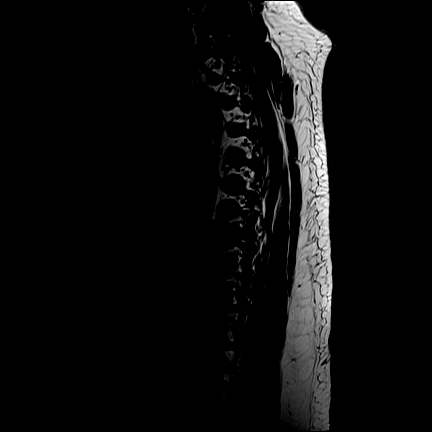
[im 7/17]
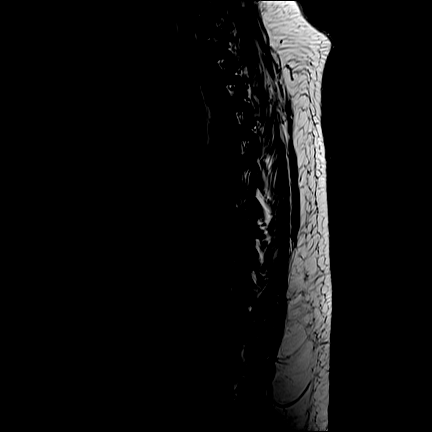
[im 10/17]
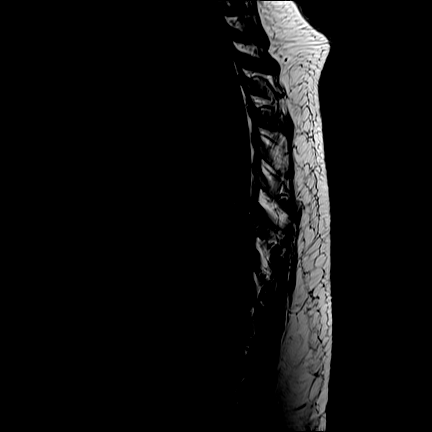
[im 13/17]
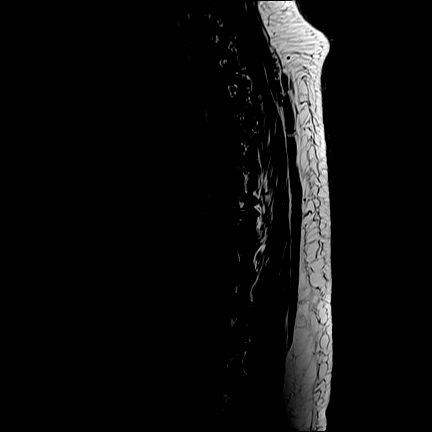
[im 17/17]
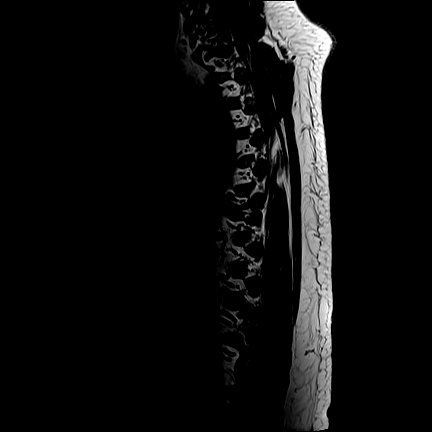

[Series 21: STIR · sagittal · 3.0mm · 0.49mm/px · 3 of 17 slices shown]
[im 1/17]
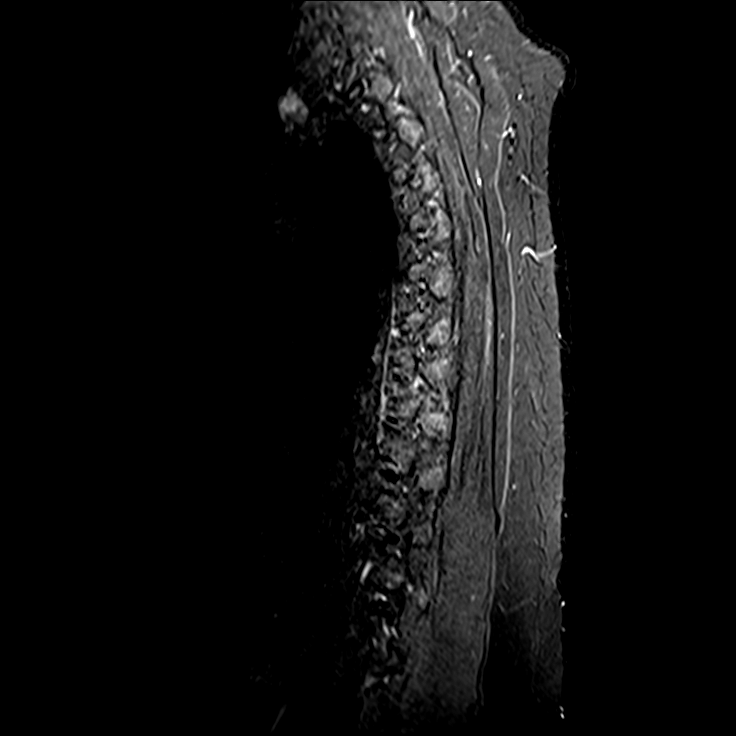
[im 4/17]
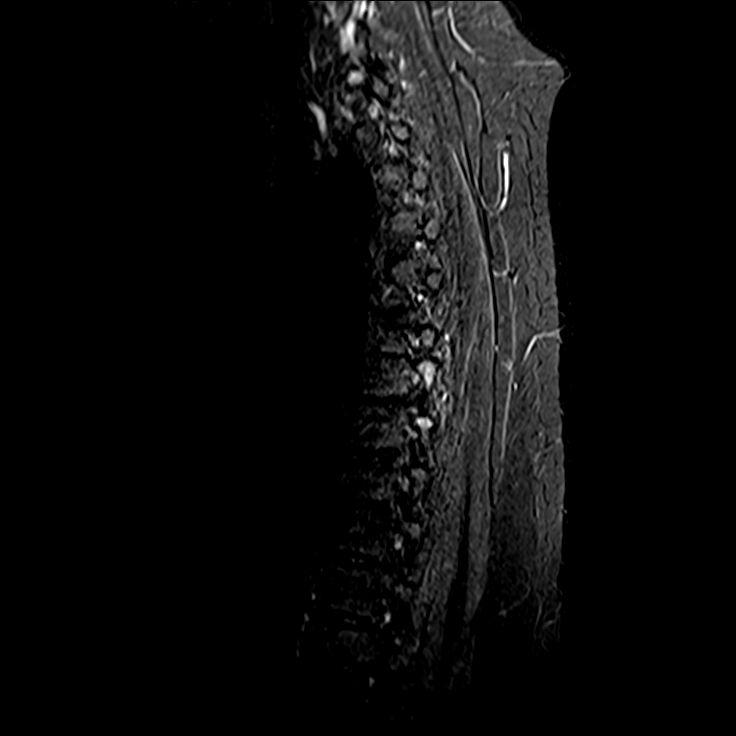
[im 7/17]
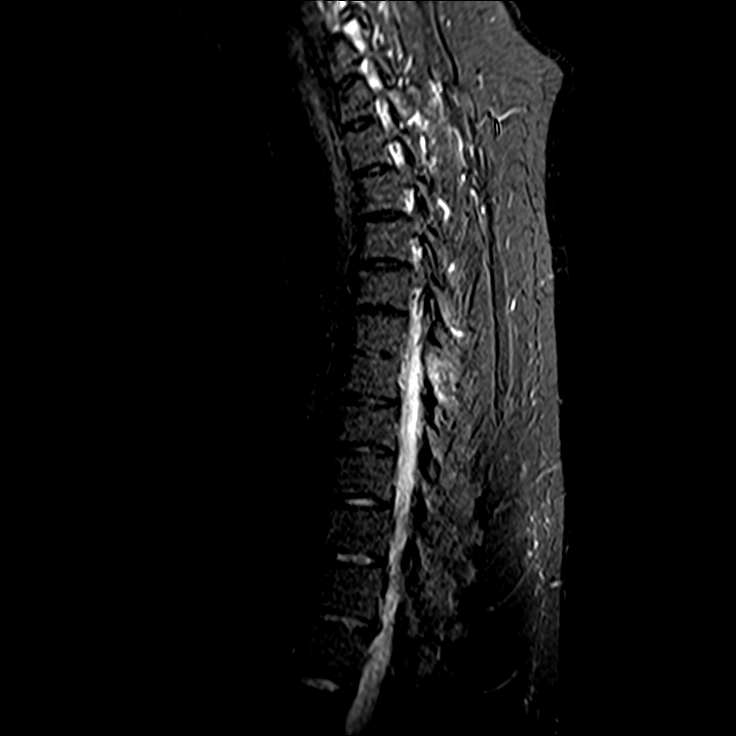

[Series 22: T2 · axial · 4.0mm · 0.66mm/px · z∈[-348,-77]mm · 8 of 39 slices shown (2 of 2)]
[im 1/39]
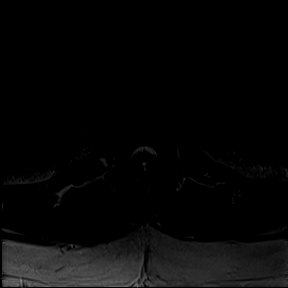
[im 6/39]
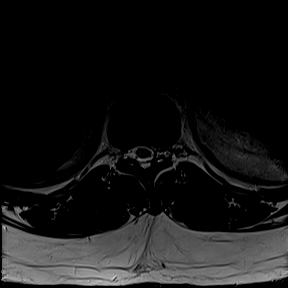
[im 12/39]
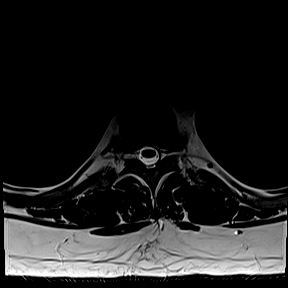
[im 18/39]
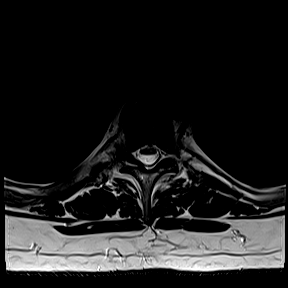
[im 21/39]
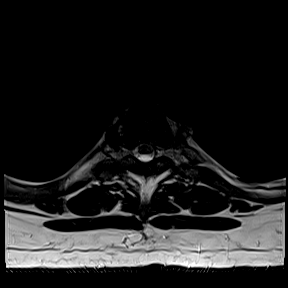
[im 27/39]
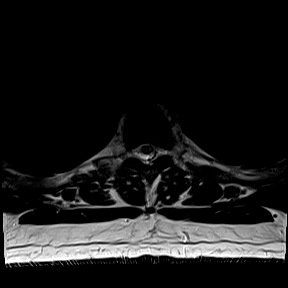
[im 33/39]
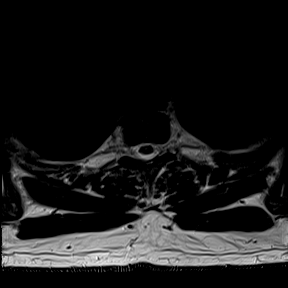
[im 39/39]
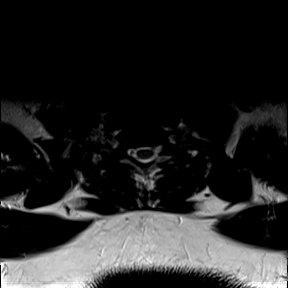

[25 of 48 positions shown; findings below may reference images not displayed]

FINDINGS: MRI CERVICAL SPINE FINDINGS

Alignment: Straightening of the cervical curvature.

Vertebrae: No fracture, evidence of discitis, or bone lesion.

Cord: Normal signal and morphology.

Posterior Fossa, vertebral arteries, paraspinal tissues: Negative.

Disc levels:

C2-3: No spinal canal or neural foraminal stenosis.

C3-4: Small posterior disc protrusion. No significant spinal canal
or neural foraminal stenosis.

C4-5: No significant spinal canal or neural foraminal stenosis.

C5-6: No significant spinal canal or neural foraminal stenosis.

C6-7: No significant spinal canal or neural foraminal stenosis.

C7-T1: No significant spinal canal or neural foraminal stenosis.

MRI THORACIC SPINE FINDINGS

Alignment:  Physiologic.

Vertebrae: No fracture, evidence of discitis, or bone lesion.
Segmentation defect at T6-7. Marrow edema in the articular processes
of the right facet joint at T7-8, degenerative.

Cord: Focal anterior displacement of the spinal cord at the T6-7
level with widening of the dorsal CSF space suggestive of a dorsal
arachnoid web. No cord signal abnormality.

Paraspinal and other soft tissues: Negative.

Disc levels:

Small posterior disc protrusions at T4-5, T5-6, T7-8 and T8-9 with
mild mass effect on the cord at the T7-8 and T8-9 levels. No
significant spinal canal or neural foraminal stenosis at any
thoracic level.
IMPRESSION: 1. No definitive cord signal abnormality seen.
2. Focal anterior displacement of the thoracic spinal cord at the
T6-7 level with widening of the dorsal CSF space suggestive of a
dorsal arachnoid web.
3. Mild degenerative changes of the cervical and thoracic spine
without high-grade spinal canal or neural foraminal stenosis.

## 2023-05-15 IMAGING — MR MR CERVICAL SPINE W/O CM
5 series · 38 of 48 positions shown · non-contrast
Comparison: None.

CLINICAL DATA: Demyelinating disease.

EXAM:
MRI CERVICAL AND THORACIC SPINE WITHOUT CONTRAST
TECHNIQUE: Multiplanar and multiecho pulse sequences of the cervical spine, to
include the craniocervical junction and cervicothoracic junction,
and the thoracic spine, were obtained without intravenous contrast.

[Series 1: T1 · sagittal · 3.0mm · 0.86mm/px · 6 of 15 slices shown]
[im 1/15]
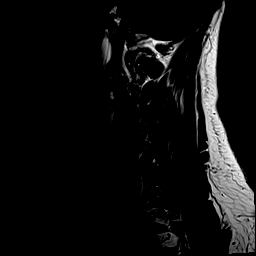
[im 3/15]
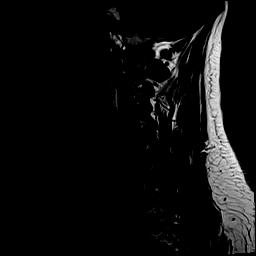
[im 6/15]
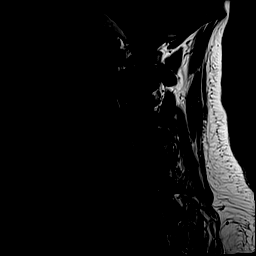
[im 9/15]
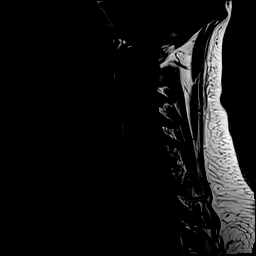
[im 12/15]
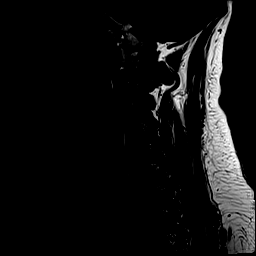
[im 15/15]
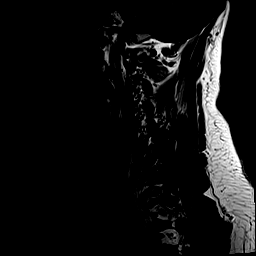

[Series 2: STIR · sagittal · 3.0mm · 0.69mm/px · 6 of 15 slices shown]
[im 1/15]
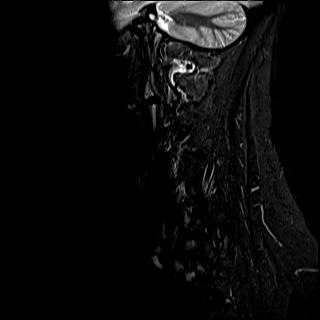
[im 3/15]
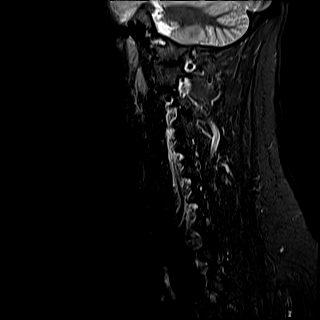
[im 6/15]
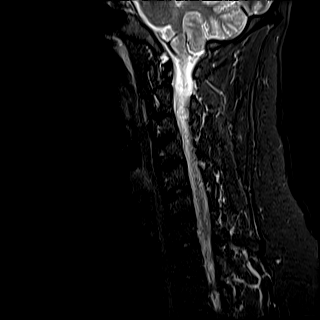
[im 9/15]
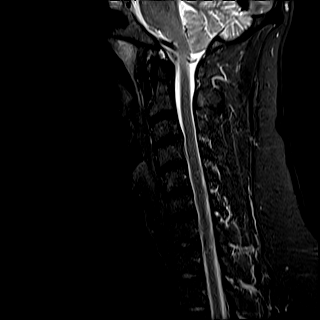
[im 12/15]
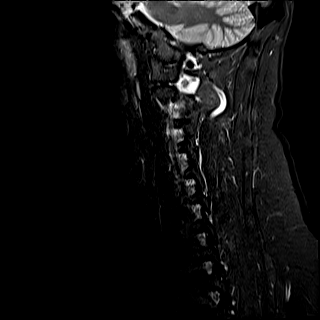
[im 15/15]
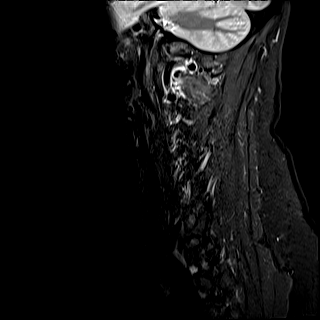

[Series 3: T2 · sagittal · 3.0mm · 0.69mm/px · 6 of 15 slices shown (1 of 2)]
[im 1/15]
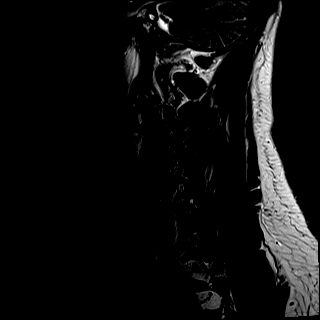
[im 3/15]
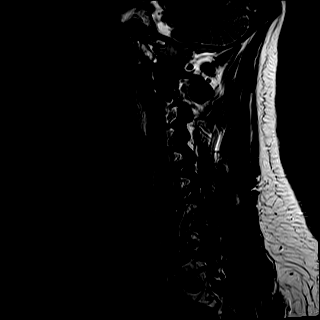
[im 6/15]
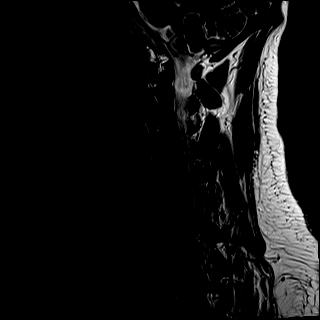
[im 9/15]
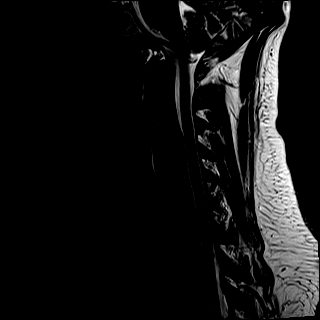
[im 12/15]
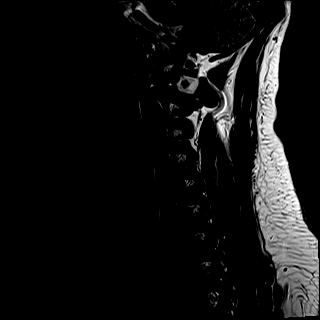
[im 15/15]
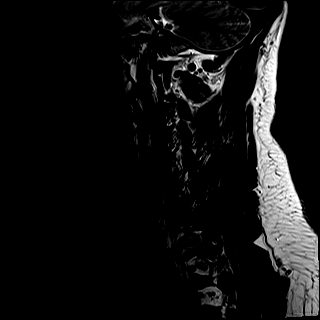

[Series 4: T2 · axial · 3.0mm · 0.70mm/px · z∈[-93,+38]mm · 12 of 40 slices shown (2 of 2)]
[im 1/40]
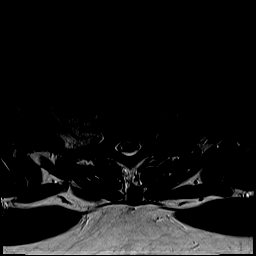
[im 3/40]
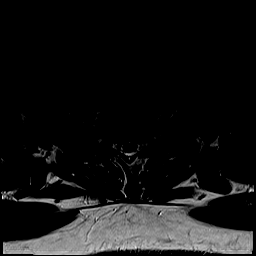
[im 6/40]
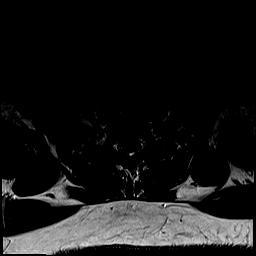
[im 9/40]
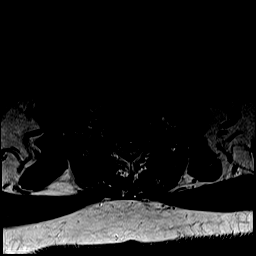
[im 12/40]
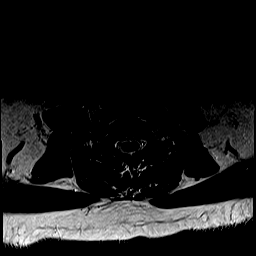
[im 14/40]
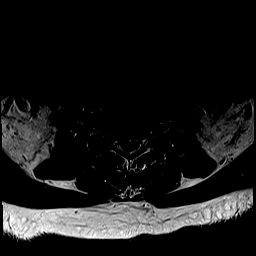
[im 17/40]
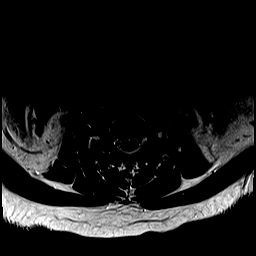
[im 20/40]
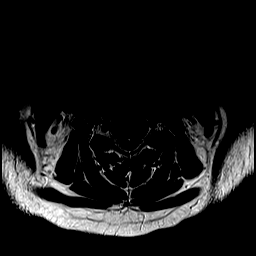
[im 23/40]
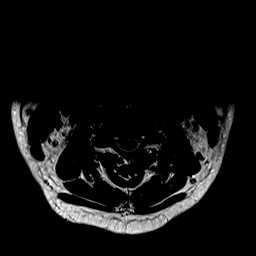
[im 28/40]
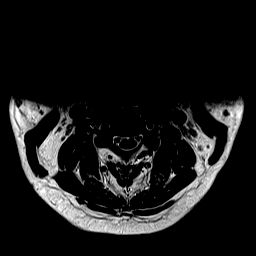
[im 34/40]
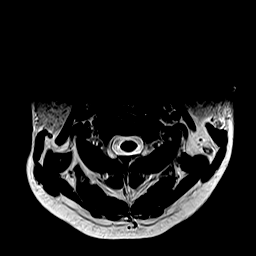
[im 40/40]
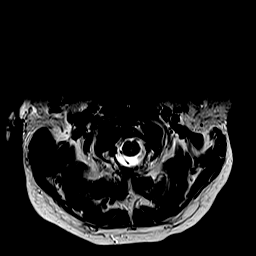

[Series 5: GRE · axial · 3.0mm · 0.35mm/px · z∈[-93,+38]mm · 8 of 40 slices shown]
[im 1/40]
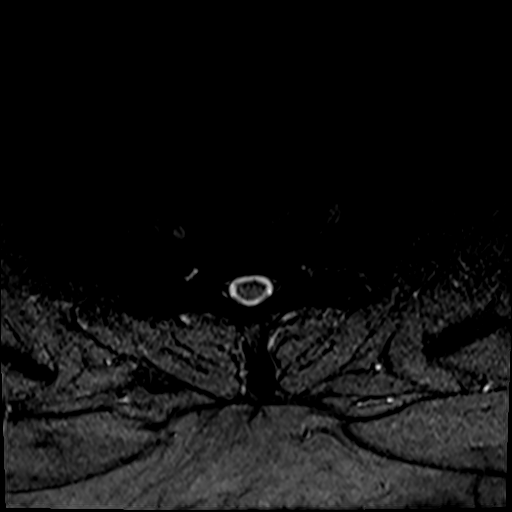
[im 6/40]
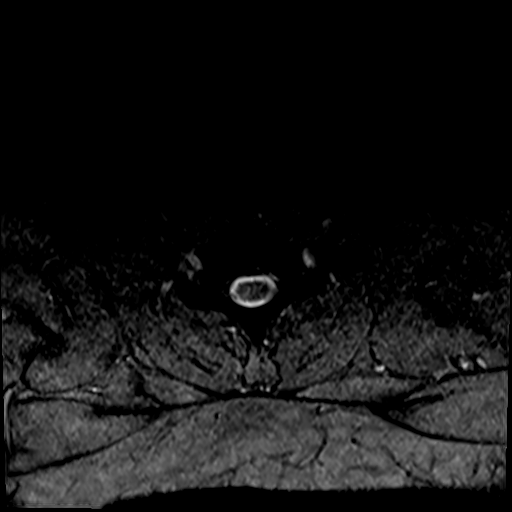
[im 12/40]
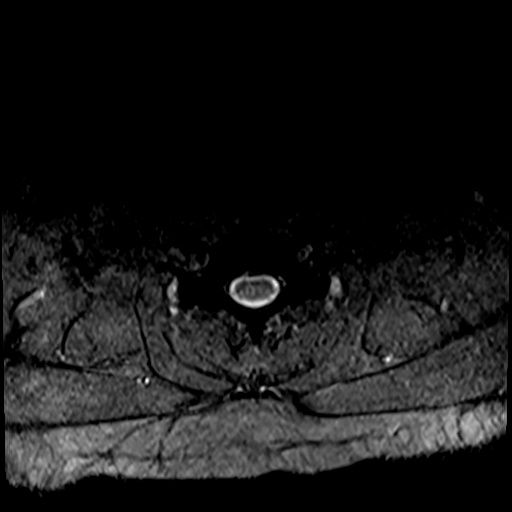
[im 17/40]
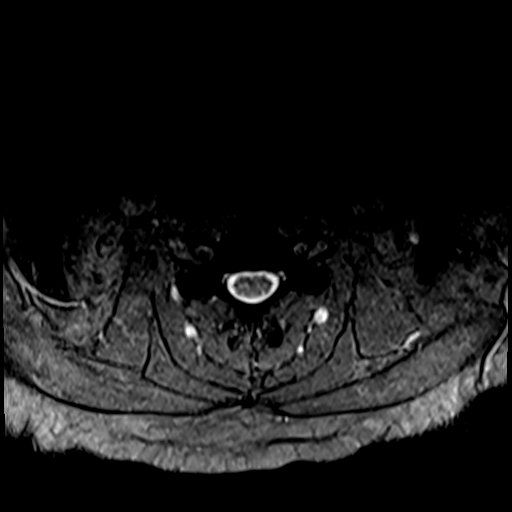
[im 23/40]
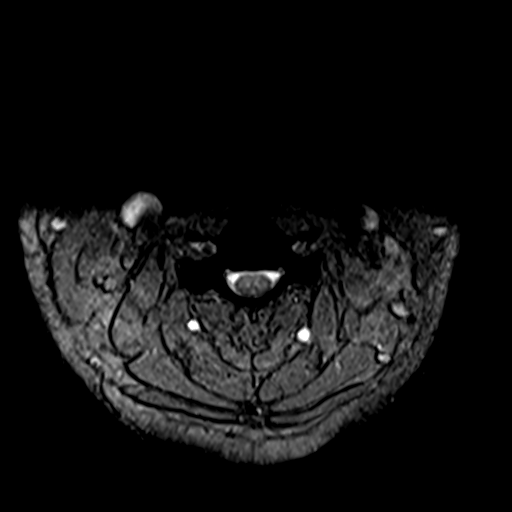
[im 28/40]
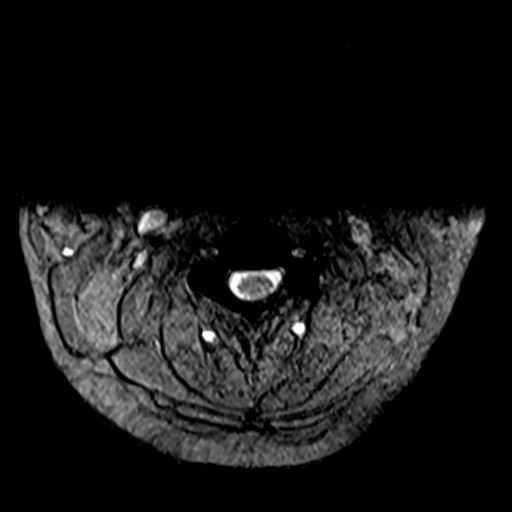
[im 34/40]
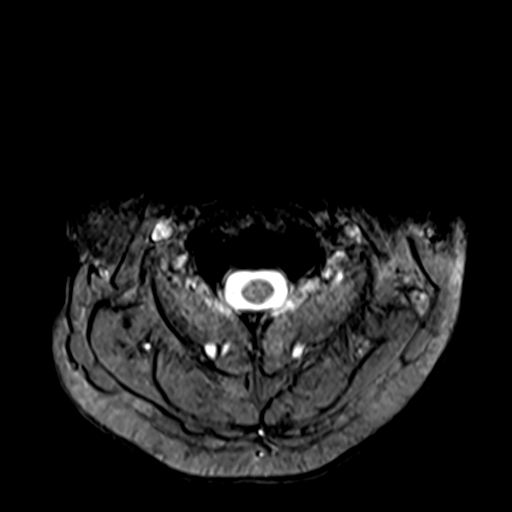
[im 40/40]
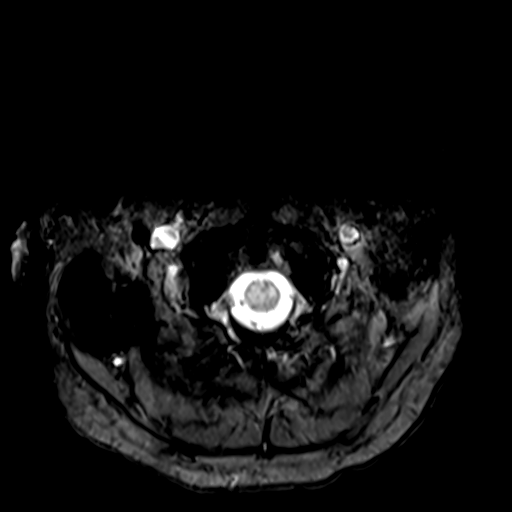

[38 of 48 positions shown; findings below may reference images not displayed]

FINDINGS: MRI CERVICAL SPINE FINDINGS

Alignment: Straightening of the cervical curvature.

Vertebrae: No fracture, evidence of discitis, or bone lesion.

Cord: Normal signal and morphology.

Posterior Fossa, vertebral arteries, paraspinal tissues: Negative.

Disc levels:

C2-3: No spinal canal or neural foraminal stenosis.

C3-4: Small posterior disc protrusion. No significant spinal canal
or neural foraminal stenosis.

C4-5: No significant spinal canal or neural foraminal stenosis.

C5-6: No significant spinal canal or neural foraminal stenosis.

C6-7: No significant spinal canal or neural foraminal stenosis.

C7-T1: No significant spinal canal or neural foraminal stenosis.

MRI THORACIC SPINE FINDINGS

Alignment:  Physiologic.

Vertebrae: No fracture, evidence of discitis, or bone lesion.
Segmentation defect at T6-7. Marrow edema in the articular processes
of the right facet joint at T7-8, degenerative.

Cord: Focal anterior displacement of the spinal cord at the T6-7
level with widening of the dorsal CSF space suggestive of a dorsal
arachnoid web. No cord signal abnormality.

Paraspinal and other soft tissues: Negative.

Disc levels:

Small posterior disc protrusions at T4-5, T5-6, T7-8 and T8-9 with
mild mass effect on the cord at the T7-8 and T8-9 levels. No
significant spinal canal or neural foraminal stenosis at any
thoracic level.
IMPRESSION: 1. No definitive cord signal abnormality seen.
2. Focal anterior displacement of the thoracic spinal cord at the
T6-7 level with widening of the dorsal CSF space suggestive of a
dorsal arachnoid web.
3. Mild degenerative changes of the cervical and thoracic spine
without high-grade spinal canal or neural foraminal stenosis.

## 2023-06-25 ENCOUNTER — Encounter: Payer: Self-pay | Admitting: Emergency Medicine

## 2023-06-25 ENCOUNTER — Ambulatory Visit
Admission: EM | Admit: 2023-06-25 | Discharge: 2023-06-25 | Disposition: A | Discharge status: Home / Self Care | Payer: Self-pay | Attending: Nurse Practitioner | Admitting: Nurse Practitioner

## 2023-06-25 DIAGNOSIS — B349 Viral infection, unspecified: Secondary | ICD-10-CM | POA: Insufficient documentation

## 2023-06-25 DIAGNOSIS — R062 Wheezing: Secondary | ICD-10-CM | POA: Insufficient documentation

## 2023-06-25 DIAGNOSIS — J029 Acute pharyngitis, unspecified: Secondary | ICD-10-CM | POA: Insufficient documentation

## 2023-06-25 DIAGNOSIS — R059 Cough, unspecified: Secondary | ICD-10-CM | POA: Insufficient documentation

## 2023-06-25 LAB — POC COVID19/FLU A&B COMBO
Covid Antigen, POC: NEGATIVE
Influenza A Antigen, POC: NEGATIVE
Influenza B Antigen, POC: NEGATIVE

## 2023-06-25 LAB — POCT RAPID STREP A (OFFICE): Rapid Strep A Screen: NEGATIVE

## 2023-06-25 MED ORDER — FLUTICASONE PROPIONATE 50 MCG/ACT NA SUSP: 2.0000 | Freq: Every day | NASAL | 0 refills | Status: DC

## 2023-06-25 MED ORDER — ALBUTEROL SULFATE HFA 108 (90 BASE) MCG/ACT IN AERS: 2.0000 | INHALATION_SPRAY | Freq: Four times a day (QID) | RESPIRATORY_TRACT | 0 refills | Status: DC | PRN

## 2023-06-25 MED ORDER — PROMETHAZINE-DM 6.25-15 MG/5ML PO SYRP: 5.0000 mL | ORAL_SOLUTION | Freq: Four times a day (QID) | ORAL | 0 refills | Status: DC | PRN

## 2023-06-25 MED ORDER — ALBUTEROL SULFATE (2.5 MG/3ML) 0.083% IN NEBU
2.5000 mg | INHALATION_SOLUTION | Freq: Once | RESPIRATORY_TRACT | Status: AC
  Administered 2023-06-25: 2.5 mg via RESPIRATORY_TRACT

## 2023-06-25 NOTE — ED Provider Notes (Signed)
 [facility]    CSN: [id number] Arrival date & time: 06/25/23  1736      History   Chief Complaint No chief complaint on file.   HPI Ian Gonzalez is a 38 y.o. male.   The history is provided by the patient.   Patient presents for complaints of fever, chills, fatigue, body aches, sore throat, runny nose, nausea, vomiting, and cough.  Tmax between 100-101.  States that his fever occurred last this morning, along with vomiting.  States that he has been drinking since he vomited without difficulty.  Denies headache, ear pain, wheezing, difficulty breathing, chest pain, abdominal pain, nausea, vomiting, diarrhea, or rash.  Patient states that he does feel that it is difficult for him to take a deep breath then and that he does have some chest tightness.  Patient reports that he is a current smoker, and has been smoking since his symptoms started.  Reports that he has been taking Mucinex  for his symptoms.   Past Medical History:  Diagnosis Date   Bipolar 1 disorder (HCC)    Depression    Family history of BRCA2 gene positive    Family history of colon cancer    Family history of melanoma    Family history of prostate cancer    Leukocytosis 02/06/2021   Sleep disorder     Patient Active Problem List   Diagnosis Date Noted   SBO (small bowel obstruction) (HCC) 01/08/2023   Enteritis 01/08/2023   Genetic testing 07/18/2022   Family history of prostate cancer 07/04/2022   Family history of colon cancer 07/04/2022   Family history of melanoma 07/04/2022   Family history of BRCA2 gene positive 07/04/2022   Encounter for general adult medical examination with abnormal findings 02/21/2022   Insomnia 05/19/2021   Gastroesophageal reflux disease 05/19/2021   Subclinical hyperthyroidism 05/19/2021   PSVT (paroxysmal supraventricular tachycardia) (HCC) 02/08/2021   History of optic neuritis 02/06/2021   Obesity (BMI 30-39.9) 02/06/2021   Tobacco abuse 02/06/2021     Past Surgical History:  Procedure Laterality Date   CHOLECYSTECTOMY N/A 03/18/2015   Procedure: LAPAROSCOPIC CHOLECYSTECTOMY;  Surgeon: Oneil Budge, MD;  Location: [facility];  Service: General;  Laterality: N/A;   NO PAST SURGERIES         Home Medications    Prior to Admission medications   Medication Sig Start Date End Date Taking? Authorizing Provider  albuterol  (VENTOLIN  HFA) 108 (90 Base) MCG/ACT inhaler Inhale 2 puffs into the lungs every 6 (six) hours as needed. 06/25/23  Yes Leath-Warren, Etta PARAS, NP  fluticasone  (FLONASE ) 50 MCG/ACT nasal spray Place 2 sprays into both nostrils daily. 06/25/23  Yes Leath-Warren, Etta PARAS, NP  promethazine -dextromethorphan (PROMETHAZINE -DM) 6.25-15 MG/5ML syrup Take 5 mLs by mouth 4 (four) times daily as needed. 06/25/23  Yes Leath-Warren, Etta PARAS, NP  acetaminophen  (TYLENOL ) 325 MG tablet Take 650 mg by mouth every 6 (six) hours as needed.    [provider]    Family History Family History  Problem Relation Age of Onset   Head & neck cancer Father    Heart failure Maternal Aunt    Congestive Heart Failure Maternal Uncle    Melanoma Paternal Aunt 40       x2   Melanoma Paternal Aunt    Prostate cancer Paternal Uncle 46       BRCA2 [phone]*   Heart failure Maternal Grandmother    Heart attack Maternal Grandfather    Colon cancer Paternal Grandmother  Prostate cancer Paternal Grandfather     Social History Social History   Tobacco Use   Smoking status: Every Day    Current packs/day: 1.00    Average packs/day: 1 pack/day for 10.0 years (10.0 ttl pk-yrs)    Types: Cigarettes   Smokeless tobacco: Never  Vaping Use   Vaping status: Some Days  Substance Use Topics   Alcohol use: Yes    Comment: Occassionally    Drug use: No     Allergies   Patient has no known allergies.   Review of Systems Review of Systems Per HPI  Physical Exam Triage Vital Signs ED Triage Vitals  Encounter Vitals Group     BP  06/25/23 1744 (!) 159/101     Systolic BP Percentile --      Diastolic BP Percentile --      Pulse Rate 06/25/23 1744 88     Resp 06/25/23 1744 20     Temp 06/25/23 1744 98.1 F (36.7 C)     Temp Source 06/25/23 1744 Oral     SpO2 06/25/23 1744 95 %     Weight --      Height --      Head Circumference --      Peak Flow --      Pain Score 06/25/23 1746 10     Pain Loc --      Pain Education --      Exclude from Growth Chart --    No data found.  Updated Vital Signs BP (!) 159/101 (BP Location: Right Arm)   Pulse 88   Temp 98.1 F (36.7 C) (Oral)   Resp 20   SpO2 95%   Visual Acuity Right Eye Distance:   Left Eye Distance:   Bilateral Distance:    Right Eye Near:   Left Eye Near:    Bilateral Near:     Physical Exam Vitals and nursing note reviewed.  Constitutional:      General: He is not in acute distress.    Appearance: Normal appearance.  HENT:     Head: Normocephalic.     Right Ear: Tympanic membrane, ear canal and external ear normal.     Left Ear: Tympanic membrane, ear canal and external ear normal.     Nose: Congestion present.     Right Turbinates: Enlarged and swollen.     Left Turbinates: Enlarged and swollen.     Right Sinus: No maxillary sinus tenderness or frontal sinus tenderness.     Left Sinus: No maxillary sinus tenderness or frontal sinus tenderness.     Mouth/Throat:     Lips: Pink.     Mouth: Mucous membranes are moist.     Pharynx: Uvula midline. Pharyngeal swelling, posterior oropharyngeal erythema and postnasal drip present. No oropharyngeal exudate or uvula swelling.     Tonsils: 1+ on the right. 1+ on the left.     Comments: Cobblestoning present to posterior oropharynx  Eyes:     Pupils: Pupils are equal, round, and reactive to light.  Cardiovascular:     Rate and Rhythm: Normal rate and regular rhythm.     Pulses: Normal pulses.     Heart sounds: Normal heart sounds.  Pulmonary:     Effort: Pulmonary effort is normal. No  respiratory distress.     Breath sounds: No stridor. Examination of the right-upper field reveals wheezing. Examination of the right-lower field reveals wheezing. Examination of the left-lower field reveals wheezing. Wheezing present. No rhonchi  or rales.  Abdominal:     General: Bowel sounds are normal.     Palpations: Abdomen is soft.     Tenderness: There is no abdominal tenderness.  Musculoskeletal:     Cervical back: Normal range of motion.  Skin:    General: Skin is warm and dry.  Neurological:     General: No focal deficit present.     Mental Status: He is alert and oriented to person, place, and time.  Psychiatric:        Mood and Affect: Mood normal.        Behavior: Behavior normal.      UC Treatments / Results  Labs (all labs ordered are listed, but only abnormal results are displayed) Labs Reviewed  POC COVID19/FLU A&B COMBO - Normal  CULTURE, GROUP A STREP [facility])  POCT RAPID STREP A (OFFICE)    EKG   Radiology No results found.  Procedures Procedures (including critical care time)  Medications Ordered in UC Medications  albuterol  (PROVENTIL ) (2.5 MG/3ML) 0.083% nebulizer solution 2.5 mg (2.5 mg Nebulization Given 06/25/23 1803)    Initial Impression / Assessment and Plan / UC Course  I have reviewed the triage vital signs and the nursing notes.  Pertinent labs & imaging results that were available during my care of the patient were reviewed by me and considered in my medical decision making (see chart for details).  The rapid strep test and COVID/flu test were negative.  Throat culture is pending.  Symptoms consistent with viral illness.  Symptomatic treatment provided with Promethazine  DM for cough, and fluticasone  50 mcg nasal spray for nasal congestion.  Supportive care recommendations were provided and discussed with the patient to include fluids, rest, over-the-counter analgesics, warm salt water gargles, and use of a humidifier during sleep.   Discussed indications with the patient regarding follow-up.  Patient was in agreement with this plan of care and verbalized understanding.  All questions were answered.  Patient stable for discharge.  Work note was provided.  Final Clinical Impressions(s) / UC Diagnoses   Final diagnoses:  Viral illness  Wheezing  Cough, unspecified type  Sore throat     Discharge Instructions      The rapid strep test and COVID/flu test were negative.  A throat culture is pending.  You will be contacted if the pending test result is positive.  You also have access to the results via MyChart. Take medication as prescribed. Increase fluids and allow for plenty of rest. Continue over-the-counter Tylenol  or ibuprofen  as needed for pain, fever, or general discomfort. Warm salt water gargles 3-4 times daily as needed for throat pain or discomfort. Recommend a soft diet such as soup, broth, yogurt, pudding, Jell-O, or popsicles while throat pain persist.  Also recommend the use of warm teas with honey and lemon. Recommend the use of Chloraseptic throat spray or throat lozenges for throat pain. Recommend using a humidifier in your bedroom at nighttime during sleep and sleeping elevated on pillows while cough symptoms persist. It is recommended that you try to stop smoking while symptoms persist.  There is also recommended to consider long-term smoking cessation. Symptoms should improve over the next 5 to 7 days.  If symptoms do not improve, or you experience worsening, you may follow-up in this clinic or with your primary care physician for further evaluation. Follow-up as needed.     ED Prescriptions     Medication Sig Dispense Auth. Provider   promethazine -dextromethorphan (PROMETHAZINE -DM) 6.25-15 MG/5ML syrup Take  5 mLs by mouth 4 (four) times daily as needed. 118 mL Leath-Warren, Etta PARAS, NP   fluticasone  (FLONASE ) 50 MCG/ACT nasal spray Place 2 sprays into both nostrils daily. 16 g Leath-Warren,  Etta PARAS, NP   albuterol  (VENTOLIN  HFA) 108 (90 Base) MCG/ACT inhaler Inhale 2 puffs into the lungs every 6 (six) hours as needed. 8 g Leath-Warren, Etta PARAS, NP      PDMP not reviewed this encounter.   Gilmer Etta PARAS, NP 06/25/23 1926

## 2023-06-25 NOTE — Discharge Instructions (Addendum)
 The rapid strep test and COVID/flu test were negative.  A throat culture is pending.  You will be contacted if the pending test result is positive.  You also have access to the results via MyChart. Take medication as prescribed. Increase fluids and allow for plenty of rest. Continue over-the-counter Tylenol  or ibuprofen  as needed for pain, fever, or general discomfort. Warm salt water gargles 3-4 times daily as needed for throat pain or discomfort. Recommend a soft diet such as soup, broth, yogurt, pudding, Jell-O, or popsicles while throat pain persist.  Also recommend the use of warm teas with honey and lemon. Recommend the use of Chloraseptic throat spray or throat lozenges for throat pain. Recommend using a humidifier in your bedroom at nighttime during sleep and sleeping elevated on pillows while cough symptoms persist. It is recommended that you try to stop smoking while symptoms persist.  There is also recommended to consider long-term smoking cessation. Symptoms should improve over the next 5 to 7 days.  If symptoms do not improve, or you experience worsening, you may follow-up in this clinic or with your primary care physician for further evaluation. Follow-up as needed.

## 2023-06-25 NOTE — ED Triage Notes (Signed)
 Cough, runny nose, sore throat, vomiting, chills, body aches, since Friday.  Has been taking mucinex.

## 2023-06-28 LAB — CULTURE, GROUP A STREP (THRC)

## 2023-08-02 ENCOUNTER — Ambulatory Visit
Admission: EM | Admit: 2023-08-02 | Discharge: 2023-08-02 | Disposition: A | Discharge status: Home / Self Care | Payer: Self-pay | Attending: Family Medicine | Admitting: Family Medicine

## 2023-08-02 DIAGNOSIS — K047 Periapical abscess without sinus: Secondary | ICD-10-CM

## 2023-08-02 MED ORDER — AMOXICILLIN-POT CLAVULANATE 875-125 MG PO TABS: 1.0000 | ORAL_TABLET | Freq: Two times a day (BID) | ORAL | 0 refills | Status: DC

## 2023-08-02 MED ORDER — LIDOCAINE VISCOUS HCL 2 % MT SOLN: 10.0000 mL | OROMUCOSAL | 0 refills | Status: DC | PRN

## 2023-08-02 MED ORDER — CHLORHEXIDINE GLUCONATE 0.12 % MT SOLN: 15.0000 mL | Freq: Two times a day (BID) | OROMUCOSAL | 0 refills | Status: DC

## 2023-08-02 NOTE — ED Triage Notes (Signed)
Pt reports he has left side teeth pain x 5 days

## 2023-08-02 NOTE — ED Provider Notes (Signed)
[facility]    CSN: [id number] Arrival date & time: 08/02/23  1620      History   Chief Complaint No chief complaint on file.   HPI Ian Gonzalez is a 38 y.o. male.   Presenting today with 5-day history of left upper dental pain.  Denies fever, chills, drainage, bleeding, difficulty breathing or swallowing.  Trying over-the-counter pain relievers with minimal relief.  History of dental issues in the past.    Past Medical History:  Diagnosis Date   Bipolar 1 disorder (HCC)    Depression    Family history of BRCA2 gene positive    Family history of colon cancer    Family history of melanoma    Family history of prostate cancer    Leukocytosis 02/06/2021   Sleep disorder     Patient Active Problem List   Diagnosis Date Noted   SBO (small bowel obstruction) (HCC) 01/08/2023   Enteritis 01/08/2023   Genetic testing 07/18/2022   Family history of prostate cancer 07/04/2022   Family history of colon cancer 07/04/2022   Family history of melanoma 07/04/2022   Family history of BRCA2 gene positive 07/04/2022   Encounter for general adult medical examination with abnormal findings 02/21/2022   Insomnia 05/19/2021   Gastroesophageal reflux disease 05/19/2021   Subclinical hyperthyroidism 05/19/2021   PSVT (paroxysmal supraventricular tachycardia) (HCC) 02/08/2021   History of optic neuritis 02/06/2021   Obesity (BMI 30-39.9) 02/06/2021   Tobacco abuse 02/06/2021    Past Surgical History:  Procedure Laterality Date   CHOLECYSTECTOMY N/A 03/18/2015   Procedure: LAPAROSCOPIC CHOLECYSTECTOMY;  Surgeon: Franky Macho, MD;  Location: [facility];  Service: General;  Laterality: N/A;   NO PAST SURGERIES         Home Medications    Prior to Admission medications   Medication Sig Start Date End Date Taking? Authorizing Provider  amoxicillin-clavulanate (AUGMENTIN) 875-125 MG tablet Take 1 tablet by mouth every 12 (twelve) hours. 08/02/23  Yes Particia Nearing,  PA-C  chlorhexidine (PERIDEX) 0.12 % solution Use as directed 15 mLs in the mouth or throat 2 (two) times daily. 08/02/23  Yes Particia Nearing, PA-C  lidocaine (XYLOCAINE) 2 % solution Use as directed 10 mLs in the mouth or throat every 3 (three) hours as needed. 08/02/23  Yes Particia Nearing, PA-C  acetaminophen (TYLENOL) 325 MG tablet Take 650 mg by mouth every 6 (six) hours as needed.    [provider]  albuterol (VENTOLIN HFA) 108 (90 Base) MCG/ACT inhaler Inhale 2 puffs into the lungs every 6 (six) hours as needed. 06/25/23   Leath-Warren, Sadie Haber, NP  fluticasone (FLONASE) 50 MCG/ACT nasal spray Place 2 sprays into both nostrils daily. 06/25/23   Leath-Warren, Sadie Haber, NP  promethazine-dextromethorphan (PROMETHAZINE-DM) 6.25-15 MG/5ML syrup Take 5 mLs by mouth 4 (four) times daily as needed. 06/25/23   Leath-Warren, Sadie Haber, NP    Family History Family History  Problem Relation Age of Onset   Head & neck cancer Father    Heart failure Maternal Aunt    Congestive Heart Failure Maternal Uncle    Melanoma Paternal Aunt 60       x2   Melanoma Paternal Aunt    Prostate cancer Paternal Uncle 93       BRCA2 [phone]*   Heart failure Maternal Grandmother    Heart attack Maternal Grandfather    Colon cancer Paternal Grandmother    Prostate cancer Paternal Grandfather     Social History  Social History   Tobacco Use   Smoking status: Every Day    Current packs/day: 1.00    Average packs/day: 1 pack/day for 10.0 years (10.0 ttl pk-yrs)    Types: Cigarettes   Smokeless tobacco: Never  Vaping Use   Vaping status: Some Days  Substance Use Topics   Alcohol use: Yes    Comment: Occassionally    Drug use: No     Allergies   Patient has no known allergies.   Review of Systems Review of Systems Per HPI  Physical Exam Triage Vital Signs ED Triage Vitals  Encounter Vitals Group     BP 08/02/23 1650 (!) 166/99     Systolic BP Percentile --       Diastolic BP Percentile --      Pulse Rate 08/02/23 1650 68     Resp 08/02/23 1650 16     Temp 08/02/23 1650 98 F (36.7 C)     Temp Source 08/02/23 1650 Oral     SpO2 08/02/23 1650 95 %     Weight --      Height --      Head Circumference --      Peak Flow --      Pain Score 08/02/23 1651 10     Pain Loc --      Pain Education --      Exclude from Growth Chart --    No data found.  Updated Vital Signs BP (!) 166/99 (BP Location: Right Arm)   Pulse 68   Temp 98 F (36.7 C) (Oral)   Resp 16   SpO2 95%   Visual Acuity Right Eye Distance:   Left Eye Distance:   Bilateral Distance:    Right Eye Near:   Left Eye Near:    Bilateral Near:     Physical Exam Vitals and nursing note reviewed.  Constitutional:      Appearance: Normal appearance.  HENT:     Head: Atraumatic.     Mouth/Throat:     Mouth: Mucous membranes are moist.     Comments: Poor dentition to the left upper molars with gingival erythema, edema Eyes:     Extraocular Movements: Extraocular movements intact.     Conjunctiva/sclera: Conjunctivae normal.  Cardiovascular:     Rate and Rhythm: Normal rate and regular rhythm.  Pulmonary:     Effort: Pulmonary effort is normal.     Breath sounds: Normal breath sounds.  Musculoskeletal:        General: Normal range of motion.     Cervical back: Normal range of motion and neck supple.  Skin:    General: Skin is warm and dry.  Neurological:     General: No focal deficit present.     Mental Status: He is oriented to person, place, and time.  Psychiatric:        Mood and Affect: Mood normal.        Thought Content: Thought content normal.        Judgment: Judgment normal.      UC Treatments / Results  Labs (all labs ordered are listed, but only abnormal results are displayed) Labs Reviewed - No data to display  EKG   Radiology No results found.  Procedures Procedures (including critical care time)  Medications Ordered in UC Medications - No  data to display  Initial Impression / Assessment and Plan / UC Course  I have reviewed the triage vital signs and the nursing notes.  Pertinent labs & imaging results that were available during my care of the patient were reviewed by me and considered in my medical decision making (see chart for details).     Treat with Augmentin, Peridex, viscous lidocaine, over-the-counter pain relievers.  Close dental follow-up recommended.  Return for worsening symptoms.  Final Clinical Impressions(s) / UC Diagnoses   Final diagnoses:  Dental infection   Discharge Instructions   None    ED Prescriptions     Medication Sig Dispense Auth. Provider   amoxicillin-clavulanate (AUGMENTIN) 875-125 MG tablet Take 1 tablet by mouth every 12 (twelve) hours. 14 tablet Particia Nearing, [place]   lidocaine (XYLOCAINE) 2 % solution Use as directed 10 mLs in the mouth or throat every 3 (three) hours as needed. 100 mL Particia Nearing, PA-C   chlorhexidine (PERIDEX) 0.12 % solution Use as directed 15 mLs in the mouth or throat 2 (two) times daily. 120 mL Particia Nearing, [place]      PDMP not reviewed this encounter.   Particia Nearing, [place] 08/02/23 1811

## 2023-08-08 ENCOUNTER — Encounter (HOSPITAL_COMMUNITY): Payer: Self-pay

## 2023-08-08 ENCOUNTER — Emergency Department (HOSPITAL_COMMUNITY): Payer: Self-pay

## 2023-08-08 ENCOUNTER — Emergency Department (HOSPITAL_COMMUNITY): Admission: EM | Admit: 2023-08-08 | Discharge: 2023-08-09 | Discharge status: Against Medical Advice | Payer: Self-pay

## 2023-08-08 ENCOUNTER — Other Ambulatory Visit: Payer: Self-pay

## 2023-08-08 DIAGNOSIS — H538 Other visual disturbances: Secondary | ICD-10-CM | POA: Insufficient documentation

## 2023-08-08 DIAGNOSIS — R519 Headache, unspecified: Secondary | ICD-10-CM | POA: Insufficient documentation

## 2023-08-08 LAB — BASIC METABOLIC PANEL
Anion gap: 10 (ref 5–15)
BUN: 8 mg/dL (ref 6–20)
CO2: 25 mmol/L (ref 22–32)
Calcium: 8.8 mg/dL — ABNORMAL LOW (ref 8.9–10.3)
Chloride: 99 mmol/L (ref 98–111)
Creatinine, Ser: 0.67 mg/dL (ref 0.61–1.24)
GFR, Estimated: >60 mL/min (ref >60)
Glucose, Bld: 100 mg/dL — ABNORMAL HIGH (ref 70–99)
Potassium: 3.5 mmol/L (ref 3.5–5.1)
Sodium: 134 mmol/L — ABNORMAL LOW (ref 135–145)

## 2023-08-08 LAB — CBC
HCT: 45.8 % (ref 39.0–52.0)
Hemoglobin: 14.6 g/dL (ref 13.0–17.0)
MCH: 26 pg (ref 26.0–34.0)
MCHC: 31.9 g/dL (ref 30.0–36.0)
MCV: 81.6 fL (ref 80.0–100.0)
Platelets: 237 10*3/uL (ref 150–400)
RBC: 5.61 MIL/uL (ref 4.22–5.81)
RDW: 13.6 % (ref 11.5–15.5)
WBC: 10.4 10*3/uL (ref 4.0–10.5)
nRBC: 0 % (ref 0.0–0.2)

## 2023-08-08 MED ORDER — LORAZEPAM 1 MG PO TABS
1.0000 mg | ORAL_TABLET | Freq: Once | ORAL | Status: AC
  Administered 2023-08-08: 1 mg via ORAL
  Filled 2023-08-08: qty 1

## 2023-08-08 MED ORDER — GADOBUTROL 1 MMOL/ML IV SOLN
10.0000 mL | Freq: Once | INTRAVENOUS | Status: AC | PRN
  Administered 2023-08-08: 10 mL via INTRAVENOUS

## 2023-08-08 MED ORDER — TETRACAINE HCL 0.5 % OP SOLN
1.0000 [drp] | Freq: Once | OPHTHALMIC | Status: AC
  Administered 2023-08-08: 1 [drp] via OPHTHALMIC
  Filled 2023-08-08: qty 4

## 2023-08-08 NOTE — ED Triage Notes (Signed)
 Pt arrived via POV c/o loss of vision in both of his eyes. Pt reports he was recently started on an antibiotic for an dental abscess on the upper left side of his mouth, and reports has been taking prescribed medication for past 4 days. Pt reports vision loss began Sunday morning.

## 2023-08-08 NOTE — ED Provider Notes (Signed)
 [place] EMERGENCY DEPARTMENT AT [facility] Provider Note   CSN: [id number] Arrival date & time: 08/08/23  1806     History {Add pertinent medical, surgical, social history, OB history to HPI:1} Chief Complaint  Patient presents with   Blindness    Ian Gonzalez is a 38 y.o. male.  This is a 38 year old male presenting emergency department for blurred vision and both eyes.  Reports symptoms started on Sunday after he was started on antibiotic for dental abscess.  Reports feeling pressure behind his eyes.  He can see, but vision is blurred.  Reports mild headache, no neck stiffness or photophobia.  No facial droop, unilateral weakness, no seizures.  No difficulty swallowing, difficulty breathing.   Blindness       Home Medications Prior to Admission medications   Medication Sig Start Date End Date Taking? Authorizing Provider  acetaminophen (TYLENOL) 325 MG tablet Take 650 mg by mouth every 6 (six) hours as needed for mild pain (pain score 1-3).   Yes [provider]  amoxicillin-clavulanate (AUGMENTIN) 875-125 MG tablet Take 1 tablet by mouth every 12 (twelve) hours. 08/02/23  Yes Particia Nearing, PA-C  chlorhexidine (PERIDEX) 0.12 % solution Use as directed 15 mLs in the mouth or throat 2 (two) times daily. 08/02/23  Yes Particia Nearing, PA-C  ibuprofen (ADVIL) 200 MG tablet Take 400 mg by mouth every 6 (six) hours as needed for mild pain (pain score 1-3).   Yes [provider]  lidocaine (XYLOCAINE) 2 % solution Use as directed 10 mLs in the mouth or throat every 3 (three) hours as needed. 08/02/23  Yes Particia Nearing, PA-C      Allergies    Patient has no known allergies.    Review of Systems   Review of Systems  Eyes:  Positive for blindness.    Physical Exam Updated Vital Signs BP (!) 144/78 (BP Location: Right Arm)   Pulse 69   Temp 98.8 F (37.1 C) (Oral)   Resp 18   Ht 6\' 2"  (1.88 m)   Wt (!) 155 kg   SpO2  97%   BMI 43.87 kg/m  Physical Exam Vitals and nursing note reviewed.  Constitutional:      General: He is not in acute distress.    Appearance: He is obese. He is not toxic-appearing.  HENT:     Head: Normocephalic and atraumatic.     Nose: Nose normal.     Mouth/Throat:     Mouth: Mucous membranes are moist.  Eyes:     General: Lids are normal. Lids are everted, no foreign bodies appreciated. Vision grossly intact. No scleral icterus.       Right eye: No discharge.        Left eye: No discharge.     Intraocular pressure: Right eye pressure is 16 mmHg. Left eye pressure is 17 mmHg.     Extraocular Movements: Extraocular movements intact.     Right eye: Normal extraocular motion and no nystagmus.     Left eye: Normal extraocular motion and no nystagmus.     Conjunctiva/sclera:     Right eye: Right conjunctiva is not injected. No chemosis, exudate or hemorrhage.    Left eye: Left conjunctiva is not injected. No chemosis, exudate or hemorrhage.    Pupils: Pupils are equal, round, and reactive to light.  Neurological:     Mental Status: He is alert.     ED Results / Procedures / Treatments  Labs (all labs ordered are listed, but only abnormal results are displayed) Labs Reviewed  BASIC METABOLIC PANEL - Abnormal; Notable for the following components:      Result Value   Sodium 134 (*)    Glucose, Bld 100 (*)    Calcium 8.8 (*)    All other components within normal limits  CBC    EKG None  Radiology No results found.  Procedures Procedures  {Document cardiac monitor, telemetry assessment procedure when appropriate:1}  Medications Ordered in ED Medications  tetracaine (PONTOCAINE) 0.5 % ophthalmic solution 1 drop (1 drop Both Eyes Given 08/08/23 2220)  LORazepam (ATIVAN) tablet 1 mg (1 mg Oral Given 08/08/23 1933)  gadobutrol (GADAVIST) 1 MMOL/ML injection 10 mL (10 mLs Intravenous Contrast Given 08/08/23 2156)    ED Course/ Medical Decision Making/ A&P   {    Click here for ABCD2, HEART and other calculatorsREFRESH Note before signing :1}                              Medical Decision Making This is a 38 year old male presenting emergency department for bilateral blurred vision.  Also having mild headache.  He is afebrile nontachycardic, slightly hypertensive.  Has a nonlocalizing neuroexam.  Per chart review recently seen for dental abscess at urgent care 2/21 placed on amoxicillin.  No evidence of deep space infection/Ludwig's angina on exam.  He is having headache; with his new symptoms of blurred vision concern for possible dural venous sinus/cavernous thrombosis we will get MRV to evaluate.  His eye exam is reassuring.  he has normal pressures.  Not painful EOM although describing pressure sensation behind eyes.  They are not proptotic no chemosis.  Awaiting MRV results.  If negative discharge with ophthalmology follow-up.  Amount and/or Complexity of Data Reviewed Labs: ordered. Radiology: ordered.  Risk Prescription drug management.   {Document critical care time when appropriate:1} {Document review of labs and clinical decision tools ie heart score, Chads2Vasc2 etc:1}  {Document your independent review of radiology images, and any outside records:1} {Document your discussion with family members, caretakers, and with consultants:1} {Document social determinants of health affecting pt's care:1} {Document your decision making why or why not admission, treatments were needed:1} Final Clinical Impression(s) / ED Diagnoses Final diagnoses:  None    Rx / DC Orders ED Discharge Orders     None

## 2023-08-09 ENCOUNTER — Emergency Department (HOSPITAL_COMMUNITY): Payer: Self-pay

## 2023-08-09 ENCOUNTER — Encounter (HOSPITAL_COMMUNITY): Payer: Self-pay

## 2023-08-09 ENCOUNTER — Other Ambulatory Visit: Payer: Self-pay

## 2023-08-09 ENCOUNTER — Inpatient Hospital Stay (HOSPITAL_COMMUNITY)
Admission: EM | Admit: 2023-08-09 | Discharge: 2023-08-12 | DRG: 123 | Disposition: A | Discharge status: Home / Self Care | Payer: Self-pay | Attending: Family Medicine | Admitting: Family Medicine

## 2023-08-09 DIAGNOSIS — I471 Supraventricular tachycardia, unspecified: Secondary | ICD-10-CM | POA: Diagnosis present

## 2023-08-09 DIAGNOSIS — F17210 Nicotine dependence, cigarettes, uncomplicated: Secondary | ICD-10-CM | POA: Diagnosis present

## 2023-08-09 DIAGNOSIS — T380X5A Adverse effect of glucocorticoids and synthetic analogues, initial encounter: Secondary | ICD-10-CM | POA: Diagnosis present

## 2023-08-09 DIAGNOSIS — F319 Bipolar disorder, unspecified: Secondary | ICD-10-CM | POA: Diagnosis present

## 2023-08-09 DIAGNOSIS — H5462 Unqualified visual loss, left eye, normal vision right eye: Secondary | ICD-10-CM | POA: Diagnosis present

## 2023-08-09 DIAGNOSIS — H469 Unspecified optic neuritis: Principal | ICD-10-CM | POA: Diagnosis present

## 2023-08-09 DIAGNOSIS — I10 Essential (primary) hypertension: Secondary | ICD-10-CM | POA: Diagnosis present

## 2023-08-09 DIAGNOSIS — Z6841 Body Mass Index (BMI) 40.0 and over, adult: Secondary | ICD-10-CM

## 2023-08-09 DIAGNOSIS — Z800 Family history of malignant neoplasm of digestive organs: Secondary | ICD-10-CM

## 2023-08-09 DIAGNOSIS — G4700 Insomnia, unspecified: Secondary | ICD-10-CM | POA: Diagnosis present

## 2023-08-09 DIAGNOSIS — E66813 Obesity, class 3: Secondary | ICD-10-CM | POA: Diagnosis present

## 2023-08-09 DIAGNOSIS — Z79899 Other long term (current) drug therapy: Secondary | ICD-10-CM

## 2023-08-09 DIAGNOSIS — H5461 Unqualified visual loss, right eye, normal vision left eye: Secondary | ICD-10-CM | POA: Diagnosis present

## 2023-08-09 DIAGNOSIS — I4719 Other supraventricular tachycardia: Secondary | ICD-10-CM | POA: Diagnosis present

## 2023-08-09 DIAGNOSIS — Z720 Tobacco use: Secondary | ICD-10-CM | POA: Diagnosis present

## 2023-08-09 DIAGNOSIS — Z808 Family history of malignant neoplasm of other organs or systems: Secondary | ICD-10-CM

## 2023-08-09 DIAGNOSIS — F419 Anxiety disorder, unspecified: Secondary | ICD-10-CM | POA: Diagnosis present

## 2023-08-09 DIAGNOSIS — E0590 Thyrotoxicosis, unspecified without thyrotoxic crisis or storm: Secondary | ICD-10-CM | POA: Diagnosis present

## 2023-08-09 DIAGNOSIS — K047 Periapical abscess without sinus: Secondary | ICD-10-CM | POA: Diagnosis present

## 2023-08-09 DIAGNOSIS — Z8042 Family history of malignant neoplasm of prostate: Secondary | ICD-10-CM

## 2023-08-09 DIAGNOSIS — K219 Gastro-esophageal reflux disease without esophagitis: Secondary | ICD-10-CM | POA: Diagnosis present

## 2023-08-09 DIAGNOSIS — Z8249 Family history of ischemic heart disease and other diseases of the circulatory system: Secondary | ICD-10-CM

## 2023-08-09 LAB — BASIC METABOLIC PANEL
Anion gap: 11 (ref 5–15)
BUN: 10 mg/dL (ref 6–20)
CO2: 25 mmol/L (ref 22–32)
Calcium: 9.1 mg/dL (ref 8.9–10.3)
Chloride: 100 mmol/L (ref 98–111)
Creatinine, Ser: 0.58 mg/dL — ABNORMAL LOW (ref 0.61–1.24)
GFR, Estimated: >60 mL/min (ref >60)
Glucose, Bld: 103 mg/dL — ABNORMAL HIGH (ref 70–99)
Potassium: 3.8 mmol/L (ref 3.5–5.1)
Sodium: 136 mmol/L (ref 135–145)

## 2023-08-09 LAB — CBC WITH DIFFERENTIAL/PLATELET
Abs Immature Granulocytes: 0.05 10*3/uL (ref 0.00–0.07)
Basophils Absolute: 0.1 10*3/uL (ref 0.0–0.1)
Basophils Relative: 1 %
Eosinophils Absolute: 0.3 10*3/uL (ref 0.0–0.5)
Eosinophils Relative: 3 %
HCT: 45.3 % (ref 39.0–52.0)
Hemoglobin: 14.6 g/dL (ref 13.0–17.0)
Immature Granulocytes: 1 %
Lymphocytes Relative: 31 %
Lymphs Abs: 3.2 10*3/uL (ref 0.7–4.0)
MCH: 26.4 pg (ref 26.0–34.0)
MCHC: 32.2 g/dL (ref 30.0–36.0)
MCV: 81.8 fL (ref 80.0–100.0)
Monocytes Absolute: 0.7 10*3/uL (ref 0.1–1.0)
Monocytes Relative: 7 %
Neutro Abs: 6 10*3/uL (ref 1.7–7.7)
Neutrophils Relative %: 57 %
Platelets: 260 10*3/uL (ref 150–400)
RBC: 5.54 MIL/uL (ref 4.22–5.81)
RDW: 13.6 % (ref 11.5–15.5)
WBC: 10.2 10*3/uL (ref 4.0–10.5)
nRBC: 0 % (ref 0.0–0.2)

## 2023-08-09 MED ORDER — ENOXAPARIN SODIUM 80 MG/0.8ML IJ SOSY
70.0000 mg | PREFILLED_SYRINGE | INTRAMUSCULAR | Status: DC
  Administered 2023-08-10 – 2023-08-12 (×3): 70 mg via SUBCUTANEOUS
  Filled 2023-08-09 (×3): qty 0.8

## 2023-08-09 MED ORDER — ONDANSETRON HCL 4 MG PO TABS: 4.0000 mg | ORAL_TABLET | Freq: Four times a day (QID) | ORAL | Status: DC | PRN

## 2023-08-09 MED ORDER — ONDANSETRON HCL 4 MG/2ML IJ SOLN: 4.0000 mg | Freq: Four times a day (QID) | INTRAMUSCULAR | Status: DC | PRN

## 2023-08-09 MED ORDER — GADOBUTROL 1 MMOL/ML IV SOLN
10.0000 mL | Freq: Once | INTRAVENOUS | Status: AC | PRN
  Administered 2023-08-09: 10 mL via INTRAVENOUS

## 2023-08-09 MED ORDER — ACETAMINOPHEN 325 MG PO TABS
650.0000 mg | ORAL_TABLET | Freq: Four times a day (QID) | ORAL | Status: DC | PRN
  Administered 2023-08-10 (×3): 650 mg via ORAL
  Filled 2023-08-09 (×3): qty 2

## 2023-08-09 MED ORDER — ACETAMINOPHEN 650 MG RE SUPP: 650.0000 mg | Freq: Four times a day (QID) | RECTAL | Status: DC | PRN

## 2023-08-09 MED ORDER — SODIUM CHLORIDE 0.9 % IV SOLN
1000.0000 mg | Freq: Once | INTRAVENOUS | Status: AC
  Administered 2023-08-09: 1000 mg via INTRAVENOUS
  Filled 2023-08-09: qty 16

## 2023-08-09 MED ORDER — NICOTINE 21 MG/24HR TD PT24
21.0000 mg | MEDICATED_PATCH | Freq: Every day | TRANSDERMAL | Status: DC
  Administered 2023-08-10: 21 mg via TRANSDERMAL
  Filled 2023-08-09 (×2): qty 1

## 2023-08-09 MED ORDER — LORAZEPAM 1 MG PO TABS
1.0000 mg | ORAL_TABLET | Freq: Once | ORAL | Status: AC
  Administered 2023-08-09: 1 mg via ORAL
  Filled 2023-08-09: qty 1

## 2023-08-09 NOTE — ED Provider Notes (Signed)
 [place] EMERGENCY DEPARTMENT AT [facility] Provider Note   CSN: [id number] Arrival date & time: 08/09/23  1555     History  Chief Complaint  Patient presents with   Eye Problem    Ian Gonzalez is a 38 y.o. male.  He is here for evaluation of diminished vision right eye since Sunday.  He was here yesterday and had an MRV that was negative.  He states he was told to come back for further evaluation.  He has been seen by neurology back in 2022 for left eye vision loss consistent with optic neuritis.  He did with IV Solu-Medrol.  Unclear if he followed up with neurology after that.  Had repeat MRI in 2023 of brain and cervical spine that did not show any evidence of demyelinating disease.  Had a recent dental infection and is on antibiotics.  The history is provided by the patient.  Eye Problem Location:  Right eye      Home Medications Prior to Admission medications   Medication Sig Start Date End Date Taking? Authorizing Provider  acetaminophen (TYLENOL) 325 MG tablet Take 650 mg by mouth every 6 (six) hours as needed for mild pain (pain score 1-3).    [provider]  amoxicillin-clavulanate (AUGMENTIN) 875-125 MG tablet Take 1 tablet by mouth every 12 (twelve) hours. 08/02/23   Particia Nearing, PA-C  chlorhexidine (PERIDEX) 0.12 % solution Use as directed 15 mLs in the mouth or throat 2 (two) times daily. 08/02/23   Particia Nearing, PA-C  ibuprofen (ADVIL) 200 MG tablet Take 400 mg by mouth every 6 (six) hours as needed for mild pain (pain score 1-3).    [provider]  lidocaine (XYLOCAINE) 2 % solution Use as directed 10 mLs in the mouth or throat every 3 (three) hours as needed. 08/02/23   Particia Nearing, PA-C      Allergies    Patient has no known allergies.    Review of Systems   Review of Systems  Physical Exam Updated Vital Signs BP (!) 141/82   Pulse 82   Temp 97.6 F (36.4 C) (Oral)   Ht 6\' 1"  (1.854 m)   Wt  (!) 147.4 kg   SpO2 98%   BMI 42.88 kg/m  Physical Exam Vitals and nursing note reviewed.  Constitutional:      General: He is not in acute distress.    Appearance: Normal appearance. He is well-developed.  HENT:     Head: Normocephalic and atraumatic.     Nose: Nose normal.     Mouth/Throat:     Mouth: Mucous membranes are moist.     Pharynx: Oropharynx is clear.  Eyes:     Extraocular Movements: Extraocular movements intact.     Conjunctiva/sclera: Conjunctivae normal.     Pupils: Pupils are equal, round, and reactive to light.     Comments: I was able to visualize fundus on the right and do not see any signs of hemorrhage but I cannot appreciate papilledema.  He is unable to count fingers at 2 feet but does see shadows (right eye).  Cardiovascular:     Rate and Rhythm: Normal rate and regular rhythm.     Heart sounds: No murmur heard. Pulmonary:     Effort: Pulmonary effort is normal. No respiratory distress.     Breath sounds: Normal breath sounds.  Abdominal:     Palpations: Abdomen is soft.     Tenderness: There is no abdominal  tenderness.  Musculoskeletal:        General: No swelling.     Cervical back: Neck supple.  Skin:    General: Skin is warm and dry.     Capillary Refill: Capillary refill takes less than 2 seconds.  Neurological:     General: No focal deficit present.     Mental Status: He is alert.     Sensory: No sensory deficit.     Motor: No weakness.     Gait: Gait normal.     ED Results / Procedures / Treatments   Labs (all labs ordered are listed, but only abnormal results are displayed) Labs Reviewed  BASIC METABOLIC PANEL - Abnormal; Notable for the following components:      Result Value   Glucose, Bld 103 (*)    Creatinine, Ser 0.58 (*)    All other components within normal limits  COMPREHENSIVE METABOLIC PANEL - Abnormal; Notable for the following components:   Sodium 134 (*)    Glucose, Bld 189 (*)    Creatinine, Ser 0.59 (*)    All  other components within normal limits  PHOSPHORUS - Abnormal; Notable for the following components:   Phosphorus 1.6 (*)    All other components within normal limits  CBC WITH DIFFERENTIAL/PLATELET  CBC  MAGNESIUM    EKG None  Radiology MR ORBITS W WO CONTRAST Result Date: 08/09/2023 CLINICAL DATA:  Bilateral blurred vision. EXAM: MRI OF THE ORBITS WITHOUT AND WITH CONTRAST TECHNIQUE: Multiplanar, multi-echo pulse sequences of the orbits and surrounding structures were acquired including fat saturation techniques, before and after intravenous contrast administration. CONTRAST:  10mL GADAVIST GADOBUTROL 1 MMOL/ML IV SOLN COMPARISON:  MR head without and with contrast 08/09/2023 and 03/02/2022. MR of the head and orbits 02/05/2021. FINDINGS: Orbits: Asymmetric edematous changes are present in the anterior right optic nerve with diffuse enhancement. No mass lesion is present. Left optic nerve is atrophic anteriorly. T2 hyperintensity is present posteriorly in the left optic nerve without associated enhancement. The globes are within normal limits. The extraocular muscles are normal. Visualized sinuses: The paranasal sinuses and mastoid air cells are clear. Soft tissues: The periorbital soft tissues are within normal limits. Limited intracranial: Within normal limits. IMPRESSION: 1. Asymmetric edematous changes in the anterior right optic nerve with diffuse enhancement. This is consistent with optic neuritis. 2. Atrophic left optic nerve anteriorly. 3. T2 hyperintensity posteriorly in the left optic nerve without associated enhancement. This is consistent with chronic optic neuritis. Electronically Signed   By: Marin Roberts M.D.   On: 08/09/2023 21:02   MR Brain W and Wo Contrast Result Date: 08/09/2023 CLINICAL DATA:  Bilateral blurred vision. EXAM: MRI HEAD WITHOUT AND WITH CONTRAST TECHNIQUE: Multiplanar, multiecho pulse sequences of the brain and surrounding structures were obtained without  and with intravenous contrast. CONTRAST:  10mL GADAVIST GADOBUTROL 1 MMOL/ML IV SOLN COMPARISON:  Rest MR head without and with contrast 03/02/2022 FINDINGS: Brain: No acute infarct, hemorrhage, or mass lesion is present. No significant white matter lesions are present. Deep brain nuclei are within normal limits. The ventricles are of normal size. No significant extraaxial fluid collection is present. The brainstem and cerebellum are within normal limits. The internal auditory canals are within normal limits. Midline structures are within normal limits. Postcontrast images demonstrate no pathologic enhancement. Vascular: Flow is present in the major intracranial arteries. Skull and upper cervical spine: The craniocervical junction is normal. Upper cervical spine is within normal limits. Marrow signal is unremarkable. Sinuses/Orbits: The  paranasal sinuses and mastoid air cells are clear. The globes and orbits are within normal limits. IMPRESSION: Normal MRI appearance of the brain. No acute or focal lesion to explain the patient's symptoms. Electronically Signed   By: Marin Roberts M.D.   On: 08/09/2023 20:58   MR Venogram Head Result Date: 08/09/2023 CLINICAL DATA:  Dural venous sinus thrombosis suspected. Bilateral vision loss. EXAM: MR VENOGRAM HEAD WITHOUT AND WITH CONTRAST TECHNIQUE: Angiographic images of the intracranial venous structures were acquired using MRV technique without and with intravenous contrast. CONTRAST:  10mL GADAVIST GADOBUTROL 1 MMOL/ML IV SOLN COMPARISON:  Head MRI 03/02/2022 FINDINGS: The superior sagittal sinus, internal cerebral veins, vein of Galen, straight sinus, transverse sinuses, sigmoid sinuses, and jugular bulbs are patent without evidence of thrombus. The right transverse and sigmoid sinuses are dominant. The left transverse sinus is hypoplastic but patent with an unchanged arachnoid granulation in its midportion. No abnormal intracranial enhancement is identified.  IMPRESSION: Negative MR venogram of the head. Electronically Signed   By: Sebastian Ache M.D.   On: 08/09/2023 07:44    Procedures Procedures    Medications Ordered in ED Medications  enoxaparin (LOVENOX) injection 70 mg (has no administration in time range)  acetaminophen (TYLENOL) tablet 650 mg (650 mg Oral Given 08/10/23 0615)    Or  acetaminophen (TYLENOL) suppository 650 mg ( Rectal See Alternative 08/10/23 0615)  ondansetron (ZOFRAN) tablet 4 mg (has no administration in time range)    Or  ondansetron (ZOFRAN) injection 4 mg (has no administration in time range)  nicotine (NICODERM CQ - dosed in mg/24 hours) patch 21 mg (has no administration in time range)  magnesium sulfate IVPB 4 g 100 mL (has no administration in time range)  LORazepam (ATIVAN) tablet 1 mg (1 mg Oral Given 08/09/23 1908)  gadobutrol (GADAVIST) 1 MMOL/ML injection 10 mL (10 mLs Intravenous Contrast Given 08/09/23 2027)  methylPREDNISolone sodium succinate (SOLU-MEDROL) 1,000 mg in sodium chloride 0.9 % 50 mL IVPB (0 mg Intravenous Stopped 08/10/23 0014)    ED Course/ Medical Decision Making/ A&P Clinical Course as of 08/09/23 2143  Fri Aug 09, 2023  2122 Discussed with neurology Dr. Derry Lory.  He is recommending starting the patient on 1 g of Solu-Medrol and admitting to [facility] where they will see in consult. [MB]  2143 Discussed with [facility] hospitalist Dr. Thomes Dinning who will evaluate patient for admission to [facility] campus.  I updated patient and he is agreeable to plan for admission. [MB]    Clinical Course User Index [MB] Terrilee Files, MD                                 Medical Decision Making Risk Decision regarding hospitalization.   This patient complains of decreased vision right eye; this involves an extensive number of treatment Options and is a complaint that carries with it a high risk of complications and morbidity. The differential includes optic neuritis, globe rupture, retinal  detachment, AION  I ordered, reviewed and interpreted labs, which included CBC normal chemistries with elevated glucose I ordered medication IV Solu-Medrol and reviewed PMP when indicated. I ordered imaging studies which included MRI brain and orbits and I independently    visualized and interpreted imaging which showed optic nerve edema Previous records obtained and reviewed in epic including prior neurology and ED visits I consulted neurology Dr. Derry Lory and [facility] hospitalist Dr. Thomes Dinning and discussed lab and imaging findings  and discussed disposition.  Cardiac monitoring reviewed, sinus rhythm Social determinants considered, tobacco use Critical Interventions: None  After the interventions stated above, I reevaluated the patient and found patient to be hemodynamically stable in no distress Admission and further testing considered, he will need admission to the hospital for further neurology evaluation and treatment.  He is in agreement with plan for admission.         Final Clinical Impression(s) / ED Diagnoses Final diagnoses:  Optic neuritis    Rx / DC Orders ED Discharge Orders     None         Terrilee Files, MD 08/10/23 1028

## 2023-08-09 NOTE — ED Provider Triage Note (Signed)
 Emergency Medicine Provider Triage Evaluation Note  Ian Gonzalez , a 38 y.o. male  was evaluated in triage.  Pt complains of worsened vision since ytd,  reporting left eye better,  but now has near complete vision loss right eye,  can see light and shadows but cannot make out any detail.  Placed on Augmentin 6 days ago for left upper dental abscess which is improved.  Reports pain behind right eye with movement.  Per chart had a history of optic neuritis in 2023, had been on high-dose steroids which resolved this problem, had several outpatient evaluations with Dr. Gerilyn Pilgrim and has been symptom-free until this week.  Review of Systems  Positive: Vision loss,  dental infection, headache, eye pain Negative: Fever, n/v, facial swelling.  Physical Exam  BP (!) 141/82   Pulse 82   Temp 97.6 F (36.4 C) (Oral)   Ht 6\' 1"  (1.854 m)   Wt (!) 147.4 kg   SpO2 98%   BMI 42.88 kg/m  Gen:   Awake, no distress   Resp:  Normal effort  MSK:   Moves extremities without difficulty  Other:  No periorbital erythema or edema.  Pain with EOM's right eye,  cannot differentiate 2 fingers  a foot in front of him.    Medical Decision Making  Medically screening exam initiated at 6:16 PM.  Appropriate orders placed.  [name] was informed that the remainder of the evaluation will be completed by another provider, this initial triage assessment does not replace that evaluation, and the importance of remaining in the ED until their evaluation is complete.     Burgess Amor, PA-C 08/09/23 6578

## 2023-08-09 NOTE — ED Notes (Signed)
 Pt mother came out stating they needed to get a child in the bed and would look at MRI results on my chart because they have been waiting for the results for 3 hours.

## 2023-08-09 NOTE — ED Triage Notes (Addendum)
 Pt states he has been experiencing blindness in his right eye since Sunday. Came last night and had MRI head done. Pt states he was told to go home and wait to receive a message on MyChart. He states his my chart stated for him to come back in.

## 2023-08-09 NOTE — H&P (Signed)
 History and Physical    Patient: Ian Gonzalez:[id number] DOB: [birth year] DOA: 08/09/2023 DOS: the patient was seen and examined on 08/09/2023 PCP: Anabel Halon, MD  Patient coming from: Home  Chief Complaint:  Chief Complaint  Patient presents with   Eye Problem   HPI: Ian Gonzalez is a 38 y.o. male with medical history significant of  obesity, depression/anxiety, insomnia, GERD, and tobacco abuse who presented to the emergency department due to decreased vision in the right eye which started on Sunday (2/23).  He went to an urgent care 5 days ago due to a dental abscess and was prescribed with Augmentin.  Patient complained of pressure behind his right eye and intermittent headaches that recurs every 2-3 hours.  He presented to the ED yesterday and MRV done was negative, he received a call today and was asked to come back to the ED for further evaluation.  He was seen by neurologist in 2022 for left eye vision loss consistent with optic neuritis, he was treated with IV Solu-Medrol and it was not certain if patient followed up with the neurology after that.  Repeat MRI of brain and cervical spine done in 2023 showed no evidence of demyelinating disease.  ED Course:  In the emergency department, BP was 141/82, other vital signs were within normal range.  Workup in the ED showed normal CBC and BMP except for blood glucose of 103. MRI brain without and with contrast showed normal MRI appearance of the brain MRI of the orbits without and with contrast showed asymmetric edematous changes in the anterior right optic nerve with diffuse enhancement. This is consistent with optic neuritis. MR venogram head without and with contrast showed negative MR venogram of the head. Neurologist (Dr. Derry Lory) was consulted and recommended 1 g of Solu-Medrol and to admit patient to [facility] so that neurology team can consult with patient on arrival to [facility]. Hospitalist was asked admit patient for further  evaluation and management.  Review of Systems: Review of systems as noted in the HPI. All other systems reviewed and are negative.   Past Medical History:  Diagnosis Date   Bipolar 1 disorder (HCC)    Depression    Family history of BRCA2 gene positive    Family history of colon cancer    Family history of melanoma    Family history of prostate cancer    Leukocytosis 02/06/2021   Sleep disorder    Past Surgical History:  Procedure Laterality Date   CHOLECYSTECTOMY N/A 03/18/2015   Procedure: LAPAROSCOPIC CHOLECYSTECTOMY;  Surgeon: Franky Macho, MD;  Location: [facility];  Service: General;  Laterality: N/A;   NO PAST SURGERIES      Social History:  reports that he has been smoking cigarettes. He has a 10 pack-year smoking history. He has never been exposed to tobacco smoke. He has never used smokeless tobacco. He reports current alcohol use. He reports that he does not use drugs.   No Known Allergies  Family History  Problem Relation Age of Onset   Head & neck cancer Father    Heart failure Maternal Aunt    Congestive Heart Failure Maternal Uncle    Melanoma Paternal Aunt 40       x2   Melanoma Paternal Aunt    Prostate cancer Paternal Uncle 75       BRCA2 [phone]*   Heart failure Maternal Grandmother    Heart attack Maternal Grandfather    Colon cancer Paternal Grandmother  Prostate cancer Paternal Grandfather      Prior to Admission medications   Medication Sig Start Date End Date Taking? Authorizing Provider  amoxicillin-clavulanate (AUGMENTIN) 875-125 MG tablet Take 1 tablet by mouth every 12 (twelve) hours. 08/02/23  Yes Particia Nearing, PA-C  chlorhexidine (PERIDEX) 0.12 % solution Use as directed 15 mLs in the mouth or throat 2 (two) times daily. 08/02/23  Yes Particia Nearing, PA-C  ibuprofen (ADVIL) 200 MG tablet Take 400 mg by mouth every 6 (six) hours as needed for mild pain (pain score 1-3).   Yes [provider]  lidocaine (XYLOCAINE) 2 %  solution Use as directed 10 mLs in the mouth or throat every 3 (three) hours as needed. 08/02/23  Yes Particia Nearing, PA-C    Physical Exam: BP (!) 141/82   Pulse 82   Temp 97.6 F (36.4 C) (Oral)   Ht 6\' 1"  (1.854 m)   Wt (!) 147.4 kg   SpO2 98%   BMI 42.88 kg/m   General: 38 y.o. year-old male well developed well nourished in no acute distress.  Alert and oriented x3. HEENT: NCAT, on closing left eye, patient was unable to count fingers about 2 feet from him Neck: Supple, trachea medial Cardiovascular: Regular rate and rhythm with no rubs or gallops.  No thyromegaly or JVD noted.  No lower extremity edema. 2/4 pulses in all 4 extremities. Respiratory: Clear to auscultation with no wheezes or rales. Good inspiratory effort. Abdomen: Soft, nontender nondistended with normal bowel sounds x4 quadrants. Muskuloskeletal: No cyanosis, clubbing or edema noted bilaterally Neuro: CN II-XII intact, strength 5/5 x 4, sensation, reflexes intact Skin: No ulcerative lesions noted or rashes Psychiatry: Judgement and insight appear normal. Mood is appropriate for condition and setting          Labs on Admission:  Basic Metabolic Panel: Recent Labs  Lab 08/08/23 1847 08/09/23 1831  NA 134* 136  K 3.5 3.8  CL 99 100  CO2 25 25  GLUCOSE 100* 103*  BUN 8 10  CREATININE 0.67 0.58*  CALCIUM 8.8* 9.1   Liver Function Tests: No results for input(s): "AST", "ALT", "ALKPHOS", "BILITOT", "PROT", "ALBUMIN" in the last 168 hours. No results for input(s): "LIPASE", "AMYLASE" in the last 168 hours. No results for input(s): "AMMONIA" in the last 168 hours. CBC: Recent Labs  Lab 08/08/23 1847 08/09/23 1831  WBC 10.4 10.2  NEUTROABS  --  6.0  HGB 14.6 14.6  HCT 45.8 45.3  MCV 81.6 81.8  PLT 237 260   Cardiac Enzymes: No results for input(s): "CKTOTAL", "CKMB", "CKMBINDEX", "TROPONINI" in the last 168 hours.  BNP (last 3 results) No results for input(s): "BNP" in the last 8760  hours.  ProBNP (last 3 results) No results for input(s): "PROBNP" in the last 8760 hours.  CBG: No results for input(s): "GLUCAP" in the last 168 hours.  Radiological Exams on Admission: MR ORBITS W WO CONTRAST Result Date: 08/09/2023 CLINICAL DATA:  Bilateral blurred vision. EXAM: MRI OF THE ORBITS WITHOUT AND WITH CONTRAST TECHNIQUE: Multiplanar, multi-echo pulse sequences of the orbits and surrounding structures were acquired including fat saturation techniques, before and after intravenous contrast administration. CONTRAST:  10mL GADAVIST GADOBUTROL 1 MMOL/ML IV SOLN COMPARISON:  MR head without and with contrast 08/09/2023 and 03/02/2022. MR of the head and orbits 02/05/2021. FINDINGS: Orbits: Asymmetric edematous changes are present in the anterior right optic nerve with diffuse enhancement. No mass lesion is present. Left optic nerve is atrophic anteriorly.  T2 hyperintensity is present posteriorly in the left optic nerve without associated enhancement. The globes are within normal limits. The extraocular muscles are normal. Visualized sinuses: The paranasal sinuses and mastoid air cells are clear. Soft tissues: The periorbital soft tissues are within normal limits. Limited intracranial: Within normal limits. IMPRESSION: 1. Asymmetric edematous changes in the anterior right optic nerve with diffuse enhancement. This is consistent with optic neuritis. 2. Atrophic left optic nerve anteriorly. 3. T2 hyperintensity posteriorly in the left optic nerve without associated enhancement. This is consistent with chronic optic neuritis. Electronically Signed   By: Marin Roberts M.D.   On: 08/09/2023 21:02   MR Brain W and Wo Contrast Result Date: 08/09/2023 CLINICAL DATA:  Bilateral blurred vision. EXAM: MRI HEAD WITHOUT AND WITH CONTRAST TECHNIQUE: Multiplanar, multiecho pulse sequences of the brain and surrounding structures were obtained without and with intravenous contrast. CONTRAST:  10mL  GADAVIST GADOBUTROL 1 MMOL/ML IV SOLN COMPARISON:  Rest MR head without and with contrast 03/02/2022 FINDINGS: Brain: No acute infarct, hemorrhage, or mass lesion is present. No significant white matter lesions are present. Deep brain nuclei are within normal limits. The ventricles are of normal size. No significant extraaxial fluid collection is present. The brainstem and cerebellum are within normal limits. The internal auditory canals are within normal limits. Midline structures are within normal limits. Postcontrast images demonstrate no pathologic enhancement. Vascular: Flow is present in the major intracranial arteries. Skull and upper cervical spine: The craniocervical junction is normal. Upper cervical spine is within normal limits. Marrow signal is unremarkable. Sinuses/Orbits: The paranasal sinuses and mastoid air cells are clear. The globes and orbits are within normal limits. IMPRESSION: Normal MRI appearance of the brain. No acute or focal lesion to explain the patient's symptoms. Electronically Signed   By: Marin Roberts M.D.   On: 08/09/2023 20:58   MR Venogram Head Result Date: 08/09/2023 CLINICAL DATA:  Dural venous sinus thrombosis suspected. Bilateral vision loss. EXAM: MR VENOGRAM HEAD WITHOUT AND WITH CONTRAST TECHNIQUE: Angiographic images of the intracranial venous structures were acquired using MRV technique without and with intravenous contrast. CONTRAST:  10mL GADAVIST GADOBUTROL 1 MMOL/ML IV SOLN COMPARISON:  Head MRI 03/02/2022 FINDINGS: The superior sagittal sinus, internal cerebral veins, vein of Galen, straight sinus, transverse sinuses, sigmoid sinuses, and jugular bulbs are patent without evidence of thrombus. The right transverse and sigmoid sinuses are dominant. The left transverse sinus is hypoplastic but patent with an unchanged arachnoid granulation in its midportion. No abnormal intracranial enhancement is identified. IMPRESSION: Negative MR venogram of the head.  Electronically Signed   By: Sebastian Ache M.D.   On: 08/09/2023 07:44    EKG: I independently viewed the EKG done and my findings are as followed: EKG was not done in the ED  Assessment/Plan Present on Admission:  Optic neuritis  Tobacco abuse  Principal Problem:   Optic neuritis Active Problems:   Tobacco abuse   Obesity, Class III, BMI 40-49.9 (morbid obesity) (HCC)  Optic neuritis 1 g Solu-Medrol was given per neurology recommendation Neurologist was consulted and recommended admitting patient [facility] with plan to consult on patient on arrival to [facility].  Morbid obesity (BMI 42.88) Patient was counseled about the cardiovascular and metabolic risk of morbid obesity. Patient was counseled for diet control, exercise regimen and weight loss.   Tobacco abuse Patient was counseled on tobacco abuse cessation Nicotine patch will be provided   DVT prophylaxis: Lovenox  Code Status: Full code  Family Communication: Mom  and son at bedside (all questions answered to satisfaction)  Consults: Neurology by [facility] EDP  Severity of Illness: The appropriate patient status for this patient is INPATIENT. Inpatient status is judged to be reasonable and necessary in order to provide the required intensity of service to ensure the patient's safety. The patient's presenting symptoms, physical exam findings, and initial radiographic and laboratory data in the context of their chronic comorbidities is felt to place them at high risk for further clinical deterioration. Furthermore, it is not anticipated that the patient will be medically stable for discharge from the hospital within 2 midnights of admission.   * I certify that at the point of admission it is my clinical judgment that the patient will require inpatient hospital care spanning beyond 2 midnights from the point of admission due to high intensity of service, high risk for further deterioration and high frequency of surveillance  required.*  Author: Frankey Shown, DO 08/09/2023 11:10 PM  For on call review [email or web address].

## 2023-08-10 ENCOUNTER — Inpatient Hospital Stay (HOSPITAL_COMMUNITY): Payer: Self-pay

## 2023-08-10 DIAGNOSIS — K219 Gastro-esophageal reflux disease without esophagitis: Secondary | ICD-10-CM

## 2023-08-10 DIAGNOSIS — I10 Essential (primary) hypertension: Secondary | ICD-10-CM | POA: Diagnosis present

## 2023-08-10 LAB — CBC
HCT: 45.8 % (ref 39.0–52.0)
Hemoglobin: 14.9 g/dL (ref 13.0–17.0)
MCH: 26.4 pg (ref 26.0–34.0)
MCHC: 32.5 g/dL (ref 30.0–36.0)
MCV: 81.2 fL (ref 80.0–100.0)
Platelets: 246 10*3/uL (ref 150–400)
RBC: 5.64 MIL/uL (ref 4.22–5.81)
RDW: 13.5 % (ref 11.5–15.5)
WBC: 8.4 10*3/uL (ref 4.0–10.5)
nRBC: 0 % (ref 0.0–0.2)

## 2023-08-10 LAB — MAGNESIUM: Magnesium: 1.8 mg/dL (ref 1.7–2.4)

## 2023-08-10 LAB — COMPREHENSIVE METABOLIC PANEL
ALT: 17 U/L (ref 0–44)
AST: 20 U/L (ref 15–41)
Albumin: 3.9 g/dL (ref 3.5–5.0)
Alkaline Phosphatase: 78 U/L (ref 38–126)
Anion gap: 10 (ref 5–15)
BUN: 12 mg/dL (ref 6–20)
CO2: 24 mmol/L (ref 22–32)
Calcium: 9.3 mg/dL (ref 8.9–10.3)
Chloride: 100 mmol/L (ref 98–111)
Creatinine, Ser: 0.59 mg/dL — ABNORMAL LOW (ref 0.61–1.24)
GFR, Estimated: >60 mL/min (ref >60)
Glucose, Bld: 189 mg/dL — ABNORMAL HIGH (ref 70–99)
Potassium: 3.7 mmol/L (ref 3.5–5.1)
Sodium: 134 mmol/L — ABNORMAL LOW (ref 135–145)
Total Bilirubin: 0.7 mg/dL (ref 0.0–1.2)
Total Protein: 7.6 g/dL (ref 6.5–8.1)

## 2023-08-10 LAB — PHOSPHORUS: Phosphorus: 1.6 mg/dL — ABNORMAL LOW (ref 2.5–4.6)

## 2023-08-10 LAB — GLUCOSE, CAPILLARY
Glucose-Capillary: 141 mg/dL — ABNORMAL HIGH (ref 70–99)
Glucose-Capillary: 150 mg/dL — ABNORMAL HIGH (ref 70–99)
Glucose-Capillary: 149 mg/dL — ABNORMAL HIGH (ref 70–99)

## 2023-08-10 LAB — HEMOGLOBIN A1C
Hgb A1c MFr Bld: 5.1 % (ref 4.8–5.6)
Mean Plasma Glucose: 99.67 mg/dL

## 2023-08-10 MED ORDER — LORAZEPAM 2 MG/ML IJ SOLN
1.0000 mg | INTRAMUSCULAR | Status: DC | PRN
  Administered 2023-08-11: 1 mg via INTRAVENOUS
  Filled 2023-08-10: qty 1

## 2023-08-10 MED ORDER — SODIUM PHOSPHATES 45 MMOLE/15ML IV SOLN
15.0000 mmol | Freq: Once | INTRAVENOUS | Status: AC
  Administered 2023-08-10: 15 mmol via INTRAVENOUS
  Filled 2023-08-10: qty 5

## 2023-08-10 MED ORDER — HYDRALAZINE HCL 20 MG/ML IJ SOLN: 10.0000 mg | INTRAMUSCULAR | Status: DC | PRN

## 2023-08-10 MED ORDER — PANTOPRAZOLE SODIUM 40 MG PO TBEC
40.0000 mg | DELAYED_RELEASE_TABLET | Freq: Two times a day (BID) | ORAL | Status: DC
  Administered 2023-08-10: 40 mg via ORAL
  Filled 2023-08-10: qty 1

## 2023-08-10 MED ORDER — MAGNESIUM SULFATE 4 GM/100ML IV SOLN
4.0000 g | Freq: Once | INTRAVENOUS | Status: AC
  Administered 2023-08-10: 4 g via INTRAVENOUS
  Filled 2023-08-10: qty 100

## 2023-08-10 MED ORDER — DIPHENHYDRAMINE HCL 50 MG/ML IJ SOLN: 25.0000 mg | Freq: Once | INTRAMUSCULAR | Status: DC | PRN

## 2023-08-10 MED ORDER — AMLODIPINE BESYLATE 5 MG PO TABS
5.0000 mg | ORAL_TABLET | Freq: Every day | ORAL | Status: DC
  Administered 2023-08-10 – 2023-08-12 (×3): 5 mg via ORAL
  Filled 2023-08-10: qty 1
  Filled 2023-08-10: qty 2
  Filled 2023-08-10: qty 1

## 2023-08-10 MED ORDER — SODIUM CHLORIDE 0.9 % IV SOLN
1000.0000 mg | INTRAVENOUS | Status: DC
  Administered 2023-08-10: 1000 mg via INTRAVENOUS
  Filled 2023-08-10 (×2): qty 16

## 2023-08-10 MED ORDER — INSULIN ASPART 100 UNIT/ML IJ SOLN
0.0000 [IU] | Freq: Three times a day (TID) | INTRAMUSCULAR | Status: DC
  Administered 2023-08-10: 3 [IU] via SUBCUTANEOUS
  Administered 2023-08-10 – 2023-08-11 (×3): 2 [IU] via SUBCUTANEOUS
  Administered 2023-08-12 (×2): 3 [IU] via SUBCUTANEOUS

## 2023-08-10 MED ORDER — LORAZEPAM 2 MG/ML IJ SOLN: 1.0000 mg | Freq: Once | INTRAMUSCULAR | Status: DC | PRN

## 2023-08-10 MED ORDER — PANTOPRAZOLE SODIUM 40 MG IV SOLR
40.0000 mg | INTRAVENOUS | Status: DC
  Administered 2023-08-10: 40 mg via INTRAVENOUS
  Filled 2023-08-10: qty 10

## 2023-08-10 MED ORDER — MELATONIN 3 MG PO TABS
6.0000 mg | ORAL_TABLET | Freq: Every evening | ORAL | Status: DC | PRN
  Administered 2023-08-10 – 2023-08-11 (×2): 6 mg via ORAL
  Filled 2023-08-10 (×2): qty 2

## 2023-08-10 NOTE — Plan of Care (Signed)
 Pt alert and oriented x 4. Iv is patent and infusing. Complains of eye pressure. Continent of bowel and bladder. Ambulating in room. Family at bedside this shift. Will continue with current plan of care.  Problem: Education: Goal: Knowledge of General Education information will improve Description: Including pain rating scale, medication(s)/side effects and non-pharmacologic comfort measures Outcome: Progressing   Problem: Health Behavior/Discharge Planning: Goal: Ability to manage health-related needs will improve Outcome: Progressing   Problem: Clinical Measurements: Goal: Ability to maintain clinical measurements within normal limits will improve Outcome: Progressing Goal: Will remain free from infection Outcome: Progressing Goal: Diagnostic test results will improve Outcome: Progressing Goal: Respiratory complications will improve Outcome: Progressing Goal: Cardiovascular complication will be avoided Outcome: Progressing   Problem: Activity: Goal: Risk for activity intolerance will decrease Outcome: Progressing   Problem: Nutrition: Goal: Adequate nutrition will be maintained Outcome: Progressing   Problem: Coping: Goal: Level of anxiety will decrease Outcome: Progressing   Problem: Elimination: Goal: Will not experience complications related to bowel motility Outcome: Progressing Goal: Will not experience complications related to urinary retention Outcome: Progressing   Problem: Pain Managment: Goal: General experience of comfort will improve and/or be controlled Outcome: Progressing   Problem: Safety: Goal: Ability to remain free from injury will improve Outcome: Progressing   Problem: Skin Integrity: Goal: Risk for impaired skin integrity will decrease Outcome: Progressing   Problem: Education: Goal: Ability to describe self-care measures that may prevent or decrease complications (Diabetes Survival Skills Education) will improve Outcome:  Progressing Goal: Individualized Educational Video(s) Outcome: Progressing   Problem: Coping: Goal: Ability to adjust to condition or change in health will improve Outcome: Progressing   Problem: Fluid Volume: Goal: Ability to maintain a balanced intake and output will improve Outcome: Progressing   Problem: Health Behavior/Discharge Planning: Goal: Ability to identify and utilize available resources and services will improve Outcome: Progressing Goal: Ability to manage health-related needs will improve Outcome: Progressing   Problem: Metabolic: Goal: Ability to maintain appropriate glucose levels will improve Outcome: Progressing   Problem: Nutritional: Goal: Maintenance of adequate nutrition will improve Outcome: Progressing Goal: Progress toward achieving an optimal weight will improve Outcome: Progressing   Problem: Skin Integrity: Goal: Risk for impaired skin integrity will decrease Outcome: Progressing   Problem: Tissue Perfusion: Goal: Adequacy of tissue perfusion will improve Outcome: Progressing

## 2023-08-10 NOTE — Progress Notes (Addendum)
 PROGRESS NOTE   Ian Gonzalez  GNF:[id number] DOB: Jan 15, 1986 DOA: 08/09/2023 PCP: Anabel Halon, MD   Chief Complaint  Patient presents with   Eye Problem   Level of care: Med-Surg  Brief Admission History:  38 y.o. male with medical history significant of  obesity, depression/anxiety, insomnia, GERD, and tobacco abuse who presented to the emergency department due to decreased vision in the right eye which started on Sunday (2/23).  He went to an urgent care 5 days ago due to a dental abscess and was prescribed with Augmentin.  Patient complained of pressure behind his right eye and intermittent headaches that recurs every 2-3 hours.  He presented to the ED yesterday and MRV done was negative, he received a call today and was asked to come back to the ED for further evaluation.  He was seen by neurologist in 2022 for left eye vision loss consistent with optic neuritis, he was treated with IV Solu-Medrol and it was not certain if patient followed up with the neurology after that.  Repeat MRI of brain and cervical spine done in 2023 showed no evidence of demyelinating disease.  MRI brain without and with contrast showed normal MRI appearance of the brain.  MRI of the orbits without and with contrast showed asymmetric edematous changes in the anterior right optic nerve with diffuse enhancement. This is consistent with optic neuritis. MR venogram head without and with contrast showed negative MR venogram of the head.  Neurologist (Dr. Derry Lory) was consulted and recommended 1 g of Solu-Medrol and to admit patient to [facility] so that neurology team can consult with patient on arrival to [facility].  Hospitalist was asked admit patient for further evaluation and management.   Assessment and Plan:  Optic neuritis - recurrent episode 1 g Solu-Medrol was given in ED per neurology recommendation Neurologist was consulted and recommended admitting patient to [facility] with plan to consult on patient on arrival to  [facility] He was seen in [facility] ED just prior to transfer, he is reporting ongoing symptoms of diminished vision in right eye.  I asked Dr Idelle Leech with [facility] at [facility] to see him again when he arrived at [facility] and he agreed to do so.     Morbid obesity (BMI 42.88) Patient was counseled about the cardiovascular and metabolic risk of morbid obesity. Patient was counseled for diet control, exercise regimen and weight loss.    Tobacco abuse Patient was counseled on tobacco abuse cessation Nicotine patch will be provided  Hypophosphatemia  - with low Mg, IV Mg sulfate ordered - then proceed with IV phos replacement - recheck phos, Mg in AM   GERD  - anticipating exacerbation due to high dose steroids - starting pantoprazole 40 mg BID   Hyperglycemia - steroid - induced  - BS goal 140-180 in hospital - added SSI coverage for now - if needed, would add temporary prandial/basal coverage while on steroids - follow up A1c test   Essential Hypertension - I noticed elevated BPs even prior to him receiving the IV steroids last night - will order amlodipine 5 mg daily for now with plan to follow BPs   DVT prophylaxis: enoxaparin Code Status: FULL  Family Communication:  Disposition: transfer for [facility] for neurology consultation   Consultants:  Neurology Derry Lory)  Procedures:   Antimicrobials:    Subjective: Pt seen in ED while being rolled out by [facility] to transport to [facility].  He reports that he is still symptomatic in right eye  after receiving the IV steroid treatment last night but symptoms have not worsened and no new symptoms.    Objective: Vitals:   08/10/23 0600 08/10/23 0854 08/10/23 0938 08/10/23 1058  BP: (!) 153/92 (!) 152/100 (!) 146/69 (!) 158/90  Pulse: 82 87 80 85  Resp:  18 20 17   Temp:  98 F (36.7 C) 98.5 F (36.9 C) 98.4 F (36.9 C)  TempSrc:   Oral Oral  SpO2: 95% 97% 95% 96%  Weight:      Height:        Intake/Output Summary (Last 24 hours) at 08/10/2023  1102 Last data filed at 08/10/2023 0014 Gross per 24 hour  Intake 61.36 ml  Output --  Net 61.36 ml   Filed Weights   08/09/23 1647  Weight: (!) 147.4 kg   Examination:  General exam: Appears calm and comfortable, obese male.   Respiratory system: Clear to auscultation. Respiratory effort normal. Cardiovascular system: normal S1 & S2 heard. No JVD, murmurs, rubs, gallops or clicks. No pedal edema. Gastrointestinal system: Abdomen is nondistended, soft and nontender. No organomegaly or masses felt. Normal bowel sounds heard. Central nervous system: Alert and oriented. No focal neurological deficits. Extremities: Symmetric 5 x 5 power. Skin: No rashes, lesions or ulcers. Psychiatry: Judgement and insight appear normal. Mood & affect appropriate.   Data Reviewed: I have personally reviewed following labs and imaging studies  CBC: Recent Labs  Lab 08/08/23 1847 08/09/23 1831 08/10/23 0600  WBC 10.4 10.2 8.4  NEUTROABS  --  6.0  --   HGB 14.6 14.6 14.9  HCT 45.8 45.3 45.8  MCV 81.6 81.8 81.2  PLT 237 260 246    Basic Metabolic Panel: Recent Labs  Lab 08/08/23 1847 08/09/23 1831 08/10/23 0600  NA 134* 136 134*  K 3.5 3.8 3.7  CL 99 100 100  CO2 25 25 24   GLUCOSE 100* 103* 189*  BUN 8 10 12   CREATININE 0.67 0.58* 0.59*  CALCIUM 8.8* 9.1 9.3  MG  --   --  1.8  PHOS  --   --  1.6*    CBG: No results for input(s): "GLUCAP" in the last 168 hours.  No results found for this or any previous visit (from the past 240 hours).   Radiology Studies: MR ORBITS W WO CONTRAST Result Date: 08/09/2023 CLINICAL DATA:  Bilateral blurred vision. EXAM: MRI OF THE ORBITS WITHOUT AND WITH CONTRAST TECHNIQUE: Multiplanar, multi-echo pulse sequences of the orbits and surrounding structures were acquired including fat saturation techniques, before and after intravenous contrast administration. CONTRAST:  10mL GADAVIST GADOBUTROL 1 MMOL/ML IV SOLN COMPARISON:  MR head without and with  contrast 08/09/2023 and 03/02/2022. MR of the head and orbits 02/05/2021. FINDINGS: Orbits: Asymmetric edematous changes are present in the anterior right optic nerve with diffuse enhancement. No mass lesion is present. Left optic nerve is atrophic anteriorly. T2 hyperintensity is present posteriorly in the left optic nerve without associated enhancement. The globes are within normal limits. The extraocular muscles are normal. Visualized sinuses: The paranasal sinuses and mastoid air cells are clear. Soft tissues: The periorbital soft tissues are within normal limits. Limited intracranial: Within normal limits. IMPRESSION: 1. Asymmetric edematous changes in the anterior right optic nerve with diffuse enhancement. This is consistent with optic neuritis. 2. Atrophic left optic nerve anteriorly. 3. T2 hyperintensity posteriorly in the left optic nerve without associated enhancement. This is consistent with chronic optic neuritis. Electronically Signed   By: Virl Son.D.  On: 08/09/2023 21:02   MR Brain W and Wo Contrast Result Date: 08/09/2023 CLINICAL DATA:  Bilateral blurred vision. EXAM: MRI HEAD WITHOUT AND WITH CONTRAST TECHNIQUE: Multiplanar, multiecho pulse sequences of the brain and surrounding structures were obtained without and with intravenous contrast. CONTRAST:  10mL GADAVIST GADOBUTROL 1 MMOL/ML IV SOLN COMPARISON:  Rest MR head without and with contrast 03/02/2022 FINDINGS: Brain: No acute infarct, hemorrhage, or mass lesion is present. No significant white matter lesions are present. Deep brain nuclei are within normal limits. The ventricles are of normal size. No significant extraaxial fluid collection is present. The brainstem and cerebellum are within normal limits. The internal auditory canals are within normal limits. Midline structures are within normal limits. Postcontrast images demonstrate no pathologic enhancement. Vascular: Flow is present in the major intracranial arteries.  Skull and upper cervical spine: The craniocervical junction is normal. Upper cervical spine is within normal limits. Marrow signal is unremarkable. Sinuses/Orbits: The paranasal sinuses and mastoid air cells are clear. The globes and orbits are within normal limits. IMPRESSION: Normal MRI appearance of the brain. No acute or focal lesion to explain the patient's symptoms. Electronically Signed   By: Marin Roberts M.D.   On: 08/09/2023 20:58   MR Venogram Head Result Date: 08/09/2023 CLINICAL DATA:  Dural venous sinus thrombosis suspected. Bilateral vision loss. EXAM: MR VENOGRAM HEAD WITHOUT AND WITH CONTRAST TECHNIQUE: Angiographic images of the intracranial venous structures were acquired using MRV technique without and with intravenous contrast. CONTRAST:  10mL GADAVIST GADOBUTROL 1 MMOL/ML IV SOLN COMPARISON:  Head MRI 03/02/2022 FINDINGS: The superior sagittal sinus, internal cerebral veins, vein of Galen, straight sinus, transverse sinuses, sigmoid sinuses, and jugular bulbs are patent without evidence of thrombus. The right transverse and sigmoid sinuses are dominant. The left transverse sinus is hypoplastic but patent with an unchanged arachnoid granulation in its midportion. No abnormal intracranial enhancement is identified. IMPRESSION: Negative MR venogram of the head. Electronically Signed   By: Sebastian Ache M.D.   On: 08/09/2023 07:44    Scheduled Meds:  amLODipine  5 mg Oral Daily   enoxaparin (LOVENOX) injection  70 mg Subcutaneous Q24H   insulin aspart  0-15 Units Subcutaneous TID WC   nicotine  21 mg Transdermal Daily   pantoprazole  40 mg Oral BID   Continuous Infusions:  magnesium sulfate bolus IVPB     sodium PHOSPHATE IVPB (in mmol)       LOS: 1 day   Standley Dakins, MD How to contact the [facility] Attending or Consulting provider 7A - 7P or covering provider during after hours 7P -7A, for this patient?  Check the care team in [facility] and look for a) attending/consulting [facility]  provider listed and b) the  [facility] team listed Log into [email or web address] to find provider on call.  Locate the [facility] provider you are looking for under [facility] and page to a number that you can be directly reached. If you still have difficulty reaching the provider, please page the [facility] (Director on Call) for the Hospitalists listed on amion for assistance.  08/10/2023, 11:02 AM

## 2023-08-10 NOTE — ED Notes (Signed)
 Pt ambulated to bathroom

## 2023-08-10 NOTE — Hospital Course (Addendum)
 38 y.o. male with medical history significant of  obesity, depression/anxiety, insomnia, GERD, and tobacco abuse who presented to the emergency department due to decreased vision in the right eye which started on Sunday (2/23).  He went to an urgent care 5 days ago due to a dental abscess and was prescribed with Augmentin.  Patient complained of pressure behind his right eye and intermittent headaches that recurs every 2-3 hours.  He presented to the ED yesterday and MRV done was negative, he received a call today and was asked to come back to the ED for further evaluation.  He was seen by neurologist in 2022 for left eye vision loss consistent with optic neuritis, he was treated with IV Solu-Medrol and it was not certain if patient followed up with the neurology after that.  Repeat MRI of brain and cervical spine done in 2023 showed no evidence of demyelinating disease.  MRI brain without and with contrast showed normal MRI appearance of the brain.  MRI of the orbits without and with contrast showed asymmetric edematous changes in the anterior right optic nerve with diffuse enhancement. This is consistent with optic neuritis. MR venogram head without and with contrast showed negative MR venogram of the head.  Neurologist (Dr. Derry Lory) was consulted and recommended 1 g of Solu-Medrol and to admit patient to [facility] so that neurology team can consult with patient on arrival to [facility].  Hospitalist was asked admit patient for further evaluation and management.

## 2023-08-10 NOTE — Consult Note (Signed)
 NEUROLOGY CONSULT NOTE   Date of service: August 10, 2023 Patient Name: Ian Gonzalez MRN:  [id number] DOB:  [birth year] Chief Complaint: "Optic neuritis" Requesting Provider: Willeen Niece, MD  History of Present Illness  Ian Gonzalez is a 38 y.o. male with hx of obesity, depression/anxiety, insomnia, GERD, optic neuritis and tobacco abuse who presented to the emergency department due to decreased vision in the right eye which started on 08/04/2023. He has previously been treated for optic neuritis in August of 2022. He briefly followed with Dr. Everlena Cooper and had repeat imaging done. He has never had an LP done. Mr. Whittaker was seen at urgent care on 2/21 and was prescribed antibiotics for a dental abscess. On Sunday 2/23 he developed blurry vision and pressure behind his right eye. He also endorses headaches. MRI shows asymmetric edematous changes in the anterior right optic nerve consistent with optic neuritis. He states that this episode is almost exactly the same as the last episode with central blurriness. He states that he does not feel he is improving as fast because it took him longer to notice his symptoms due to his abscess.   Previous Optic Neuritis Course  He was admitted to [facility] on 02/06/2021 with left sided headache and blurred vision in the left eye.  [place] when he developed a headache.  He went home but the next morning he couldn't see out of his left eye.  MRI of brain and orbits with and without contrast revealed normal brain but demonstrated contrast enhancement of left optic nerve consistent with optic neuritis.  MRI of cervical and thoracic spine with and without contrast was unremarkable.  Labs included sed rate 10, CRP 0.7, B12 349, negative RPR, TSH 0.308, free T4 0.88, homocysteine 7.3, and negative NMO-IgG antibody. He was treated with 3 day course of Solu-Medrol 1g. with significant improvement. He followed up with Dr. Everlena Cooper outpatient for  one visit.    ROS  Comprehensive ROS performed and pertinent positives documented in HPI   Past History   Past Medical History:  Diagnosis Date   Bipolar 1 disorder (HCC)    Depression    Family history of BRCA2 gene positive    Family history of colon cancer    Family history of melanoma    Family history of prostate cancer    Leukocytosis 02/06/2021   Sleep disorder     Past Surgical History:  Procedure Laterality Date   CHOLECYSTECTOMY N/A 03/18/2015   Procedure: LAPAROSCOPIC CHOLECYSTECTOMY;  Surgeon: Franky Macho, MD;  Location: [facility];  Service: General;  Laterality: N/A;   NO PAST SURGERIES      Family History: Family History  Problem Relation Age of Onset   Head & neck cancer Father    Heart failure Maternal Aunt    Congestive Heart Failure Maternal Uncle    Melanoma Paternal Aunt 67       x2   Melanoma Paternal Aunt    Prostate cancer Paternal Uncle 61       BRCA2 [id number]*   Heart failure Maternal Grandmother    Heart attack Maternal Grandfather    Colon cancer Paternal Grandmother    Prostate cancer Paternal Grandfather     Social History  reports that he has been smoking cigarettes. He has a 10 pack-year smoking history. He has never been exposed to tobacco smoke. He has never used smokeless tobacco. He reports current alcohol use. He reports that he does  not use drugs.  No Known Allergies  Medications   Current Facility-Administered Medications:    acetaminophen (TYLENOL) tablet 650 mg, 650 mg, Oral, Q6H PRN, 650 mg at 08/10/23 1515 **OR** acetaminophen (TYLENOL) suppository 650 mg, 650 mg, Rectal, Q6H PRN, Adefeso, Oladapo, DO   amLODipine (NORVASC) tablet 5 mg, 5 mg, Oral, Daily, Johnson, Clanford L, MD, 5 mg at 08/10/23 1144   enoxaparin (LOVENOX) injection 70 mg, 70 mg, Subcutaneous, Q24H, Adefeso, Oladapo, DO, 70 mg at 08/10/23 1102   hydrALAZINE (APRESOLINE) injection 10 mg, 10 mg, Intravenous, Q4H PRN, Johnson, Clanford L, MD   insulin aspart  (novoLOG) injection 0-15 Units, 0-15 Units, Subcutaneous, TID WC, Johnson, Clanford L, MD, 2 Units at 08/10/23 1509   nicotine (NICODERM CQ - dosed in mg/24 hours) patch 21 mg, 21 mg, Transdermal, Daily, Adefeso, Oladapo, DO, 21 mg at 08/10/23 1103   ondansetron (ZOFRAN) tablet 4 mg, 4 mg, Oral, Q6H PRN **OR** ondansetron (ZOFRAN) injection 4 mg, 4 mg, Intravenous, Q6H PRN, Adefeso, Oladapo, DO   pantoprazole (PROTONIX) EC tablet 40 mg, 40 mg, Oral, BID, Johnson, Clanford L, MD, 40 mg at 08/10/23 1102   sodium phosphate 15 mmol in sodium chloride 0.9 % 250 mL infusion, 15 mmol, Intravenous, Once, Johnson, Clanford L, MD, Last Rate: 43 mL/hr at 08/10/23 1305, 15 mmol at 08/10/23 1305  Vitals   Vitals:   08/10/23 0854 08/10/23 0938 08/10/23 1058 08/10/23 1611  BP: (!) 152/100 (!) 146/69 (!) 158/90 117/64  Pulse: 87 80 85 74  Resp: 18 20 17 18   Temp: 98 F (36.7 C) 98.5 F (36.9 C) 98.4 F (36.9 C) 98.2 F (36.8 C)  TempSrc:  Oral Oral Oral  SpO2: 97% 95% 96% 94%  Weight:      Height:        Body mass index is 42.88 kg/m.  Physical Exam   Constitutional: Appears well-developed and well-nourished.  Psych: Affect appropriate to situation.  Eyes: No scleral injection.  HENT: No OP obstruction. Poor dentition  Head: Normocephalic.  Cardiovascular: Normal rate and regular rhythm.  Respiratory: Effort normal, non-labored breathing.  GI: Soft.  No distension. There is no tenderness.  Skin: WDI.   Neurologic Examination   Neuro: Mental Status: Patient is awake, alert, oriented to person, place, month, year, and situation. Patient is able to give a clear and coherent history. No signs of aphasia or neglect Cranial Nerves: II: Left eye vision loss- endorses blurry vision - states he can seen the outline of examiner now  PERRL III,IV, VI: EOMI without ptosis or diploplia.  V: Facial sensation is symmetric to temperature VII: Facial movement is symmetric resting and smiling VIII:  Hearing is intact to voice X: Palate elevates symmetrically XI: Shoulder shrug is symmetric. XII: Tongue protrudes midline without atrophy or fasciculations.  Motor: Tone is normal. Bulk is normal. 5/5 strength was present in all four extremities.  Sensory: Sensation is symmetric to light touch and temperature in the arms and legs. No extinction to DSS present.  Deep Tendon Reflexes: 2+ and symmetric in the patellae.  1+ and symmetric in the biceps  Cerebellar: FNF and HKS are intact bilaterally  Labs/Imaging/Neurodiagnostic studies   CBC:  Recent Labs  Lab Sep 04, 2023 1831 08/10/23 0600  WBC 10.2 8.4  NEUTROABS 6.0  --   HGB 14.6 14.9  HCT 45.3 45.8  MCV 81.8 81.2  PLT 260 246   Basic Metabolic Panel:  Lab Results  Component Value Date   NA 134 (L) 08/10/2023  K 3.7 08/10/2023   CO2 24 08/10/2023   GLUCOSE 189 (H) 08/10/2023   BUN 12 08/10/2023   CREATININE 0.59 (L) 08/10/2023   CALCIUM 9.3 08/10/2023   GFRNONAA >60 08/10/2023   GFRAA >60 03/21/2015   Lipid Panel:  Lab Results  Component Value Date   LDLCALC 118 (H) 05/15/2021   HgbA1c:  Lab Results  Component Value Date   HGBA1C 5.1 08/10/2023   INR  Lab Results  Component Value Date   INR 1.0 02/07/2021   APTT  Lab Results  Component Value Date   APTT 26 02/07/2021   MRI Orbits 1. Asymmetric edematous changes in the anterior right optic nerve with diffuse enhancement. This is consistent with optic neuritis. 2. Atrophic left optic nerve anteriorly. 3. T2 hyperintensity posteriorly in the left optic nerve without associated enhancement. This is consistent with chronic optic neuritis.   MRI Brain(Personally reviewed): Normal MRI appearance of the brain. No acute or focal lesion to explain the patient's symptoms.  ASSESSMENT   KERMAN PFOST is a 38 y.o. male obesity, depression/anxiety, insomnia, GERD, prior hx of L optic neuritis in 2022 and tobacco abuse who presented to the emergency department  due to decreased vision in the right eye which started on 08/04/2023. MRI Brain with R optic neuritis.  Recurrent optic neuritis is associated with his risk of developing MS. NMOSD, MOGAD also high on differential. Also considering CRION, Paraneoplastic optic neuropathy(CRMP5/CV2 Ab).  RECOMMENDATIONS  - Anti-MOG Ab, JC Virus, AQP4 channel Ab, serum ACE, Paraneoplastic panel. - Repeat MRI w wo C, T, L spine  - Patient is claustrophobic, will need PRN ativan for exam  - CXR to screen for sarcoid LP with CSF evaluation for cell count and differential, protein, glucose, oligoclonal bands, IgG index, CSF paraneoplastic panel. I would perform this after contrasted MRI brain. This will need to be done by IR given elevated BMI. Consider infectious evaluation with CSF PCR for HSV-1, HSV-2, HHV-6, VZV, CMV, EBV, enteroviruses if the patient were to spike a fever, develop meningismus, or have other signs of infectious etiology clinically or on other workup - Continue Solu Medrol 1g daily x 5 doses. PPI while on steroids. - Follow up with Dr. Shon Millet. _______________________________________________    Signed, Elmer Picker, NP [facility]   NEUROHOSPITALIST ADDENDUM Performed a face to face diagnostic evaluation.   I have reviewed the contents of history and physical exam as documented by PA/ARNP/Resident and agree with above documentation.  I have discussed and formulated the above plan as documented. Edits to the note have been made as needed.  Erick Blinks, MD [facility] [phone]   If 7pm to 7am, please call on call as listed on AMION.

## 2023-08-10 NOTE — ED Notes (Signed)
[facility] here for transport.  

## 2023-08-11 ENCOUNTER — Inpatient Hospital Stay (HOSPITAL_COMMUNITY): Payer: Self-pay

## 2023-08-11 DIAGNOSIS — H468 Other optic neuritis: Secondary | ICD-10-CM

## 2023-08-11 DIAGNOSIS — F17210 Nicotine dependence, cigarettes, uncomplicated: Secondary | ICD-10-CM

## 2023-08-11 LAB — CSF CELL COUNT WITH DIFFERENTIAL
RBC Count, CSF: 6200 /mm3 — ABNORMAL HIGH
Tube #: 1
WBC, CSF: 2 /mm3 (ref 0–5)

## 2023-08-11 LAB — BASIC METABOLIC PANEL
Anion gap: 12 (ref 5–15)
BUN: 10 mg/dL (ref 6–20)
CO2: 25 mmol/L (ref 22–32)
Calcium: 9.6 mg/dL (ref 8.9–10.3)
Chloride: 101 mmol/L (ref 98–111)
Creatinine, Ser: 0.56 mg/dL — ABNORMAL LOW (ref 0.61–1.24)
GFR, Estimated: >60 mL/min (ref >60)
Glucose, Bld: 155 mg/dL — ABNORMAL HIGH (ref 70–99)
Potassium: 3.9 mmol/L (ref 3.5–5.1)
Sodium: 138 mmol/L (ref 135–145)

## 2023-08-11 LAB — PROTEIN AND GLUCOSE, CSF
Glucose, CSF: 103 mg/dL — ABNORMAL HIGH (ref 40–70)
Total  Protein, CSF: 91 mg/dL — ABNORMAL HIGH (ref 15–45)

## 2023-08-11 LAB — GLUCOSE, CAPILLARY
Glucose-Capillary: 144 mg/dL — ABNORMAL HIGH (ref 70–99)
Glucose-Capillary: 158 mg/dL — ABNORMAL HIGH (ref 70–99)
Glucose-Capillary: 121 mg/dL — ABNORMAL HIGH (ref 70–99)
Glucose-Capillary: 117 mg/dL — ABNORMAL HIGH (ref 70–99)
Glucose-Capillary: 159 mg/dL — ABNORMAL HIGH (ref 70–99)

## 2023-08-11 LAB — PHOSPHORUS: Phosphorus: 2.5 mg/dL (ref 2.5–4.6)

## 2023-08-11 LAB — MAGNESIUM: Magnesium: 2.2 mg/dL (ref 1.7–2.4)

## 2023-08-11 MED ORDER — PANTOPRAZOLE SODIUM 40 MG IV SOLR
40.0000 mg | INTRAVENOUS | Status: DC
  Administered 2023-08-12: 40 mg via INTRAVENOUS
  Filled 2023-08-11: qty 10

## 2023-08-11 MED ORDER — LIDOCAINE 1 % OPTIME INJ - NO CHARGE
5.0000 mL | Freq: Once | INTRAMUSCULAR | Status: AC
  Administered 2023-08-11: 3 mL via INTRADERMAL

## 2023-08-11 MED ORDER — SODIUM CHLORIDE 0.9 % IV SOLN
1000.0000 mg | INTRAVENOUS | Status: AC
  Administered 2023-08-11: 1000 mg via INTRAVENOUS
  Filled 2023-08-11: qty 16

## 2023-08-11 MED ORDER — GADOBUTROL 1 MMOL/ML IV SOLN
10.0000 mL | Freq: Once | INTRAVENOUS | Status: AC | PRN
  Administered 2023-08-11: 10 mL via INTRAVENOUS

## 2023-08-11 MED ORDER — PANTOPRAZOLE SODIUM 40 MG IV SOLR
40.0000 mg | INTRAVENOUS | Status: AC
  Administered 2023-08-11: 40 mg via INTRAVENOUS
  Filled 2023-08-11: qty 10

## 2023-08-11 MED ORDER — SODIUM CHLORIDE 0.9 % IV SOLN: 1000.0000 mg | INTRAVENOUS | Status: DC

## 2023-08-11 MED ORDER — SODIUM CHLORIDE 0.9 % IV SOLN
1000.0000 mg | INTRAVENOUS | Status: AC
  Administered 2023-08-12: 1000 mg via INTRAVENOUS
  Filled 2023-08-11: qty 16

## 2023-08-11 NOTE — Procedures (Signed)
 Technically successful LP from L4-L5 level. Very slow flow of CSF, only 2.5 mL of pink/blood tinged csf could be removed. Pt tolerated well.  Brayton El PA-C Interventional Radiology 08/11/2023 2:22 PM

## 2023-08-11 NOTE — Progress Notes (Signed)
 Consulting civil engineer Hydrographic surveyor) smelt cigarette smoke coming from patient's room. Went into patient's room and asked patient if he had been smoking with primary RN Joy already present and third RN joined in. Patient adamantly denied smoking, stated he just woke up when primary RN entered the room. Charge RN noted window open, and smell stronger in bathroom.  Security called, patient continued to deny smoking. As soon as security left, patient slammed his door closed and continued yelling obscenities. Security called again, and security arrived with [employer].  Security and police searched patient's belongings and found cigarettes, a vape, lighter, and an unknown white powder substance in a vile with a straw in it. Security took unknown substance to be disposed of.   Cigarettes, vape, and lighter stored on unit in patient's chart, informed belongings will be returned at discharge.

## 2023-08-11 NOTE — Progress Notes (Signed)
 Checked patient at room, on bed asleep, noticed room smelled cigarette smoking and  windows are wide open. Charge Nurse also smelt the cigarette smoke, asked the patient whether he smoke but directly denied it.  Security were called but patient still deny smoking. They left but were called again as patient started yelling. Security came with [employer] and started to searched patients belongings. Cigarette were found, vape, lighter and unknown white powder substance. Confiscated items were store in the patient chart except for the unknown substance.  Patient still talking loudly for sometime and went back to bed.

## 2023-08-11 NOTE — Progress Notes (Signed)
Transported to MRI via bed

## 2023-08-11 NOTE — Progress Notes (Signed)
 PROGRESS NOTE    Ian Gonzalez  ZOX:[id number] DOB: 1986/04/30 DOA: 08/09/2023 PCP: Anabel Halon, MD    Brief Narrative:  This 38 yrs. old male with PMH significant for obesity, depression / anxiety, insomnia, GERD, and tobacco abuse who presented to the ED due to decreased vision in the right eye which started on Sunday (2/23). He went to an urgent care 5 days ago due to a dental abscess and was prescribed with Augmentin. Patient complained of pressure behind his right eye and intermittent headaches that recurs every 2-3 hours.  He was seen by Neurologist in 2022 for left eye vision loss consistent with optic neuritis, he was treated with IV Solu-Medrol and it was not certain if patient followed up with the neurology after that. Repeat MRI of brain and cervical spine done in 2023 showed no evidence of demyelinating disease. MRI brain without and with contrast showed normal MRI appearance of the brain. MRI of the orbits without and with contrast showed asymmetric edematous changes in the anterior right optic nerve with diffuse enhancement. This is consistent with optic neuritis. EDP discussed the case with Neurologist, recommended to transfer to [facility].   Assessment & Plan:   Principal Problem:   Optic neuritis Active Problems:   Tobacco abuse   H/O PSVT (paroxysmal supraventricular tachycardia)   Gastroesophageal reflux disease   Subclinical hyperthyroidism   Obesity, Class III, BMI 40-49.9 (morbid obesity) (HCC)   Hypophosphatemia   Essential (primary) hypertension   Right Optic neuritis, recurrent episode: Continue IV Solu-Medrol 1 g daily for 5 days. Patient evaluated by neurology recommended workup for MS. Obtain MRI with and without contrast cervical and thoracolumbar spine. Patient may need LP with CSF evaluation.  Hold until MRI is completed. Obtain Anti-MOG Ab, JC virus, AQP 4 channel Ab, serum ACE, paraneoplastic panel. Rule out infectious causes.  Morbid obesity  (BMI 42.88) Diet and exercise discussed in detail.   Tobacco abuse Patient was counseled on tobacco abuse cessation Nicotine patch will be provided   Hypophosphatemia: Replaced.  Continue to monitor   GERD: Anticipating exacerbation due to high dose steroids Continue  pantoprazole 40 mg BID    Hyperglycemia - steroid - induced : BS goal 140-180 in hospital Continue regular insulin sliding scale. if needed, would add temporary prandial/basal coverage while on steroids  Essential Hypertension: Continue amlodipine 5 mg daily   DVT prophylaxis: Lovenox Code Status: Full code Family Communication: No family at bed side Disposition Plan:   Status is: Inpatient Remains inpatient appropriate because: Admitted for possible Optic neuritis   Consultants:  Neurology  Procedures: MRI Brain, Orbits  Antimicrobials:  Anti-infectives (From admission, onward)    None      Subjective: Patient was seen and examined at bedside.Overnight events noted.   Patient states he has not used any substance in the room. He denies any smoking.  He reports slight improvement in the vision.  Objective: Vitals:   08/10/23 1959 08/10/23 2334 08/11/23 0400 08/11/23 0825  BP: 134/88 110/61 (!) 143/77 123/64  Pulse: 84 63 68 70  Resp: 18 16 18 20   Temp: 98.4 F (36.9 C) 98 F (36.7 C) 97.8 F (36.6 C) 98 F (36.7 C)  TempSrc: Oral Oral Axillary Oral  SpO2: 94% 95% 92% 94%  Weight:      Height:       No intake or output data in the 24 hours ending 08/11/23 1154 Filed Weights   08/09/23 1647  Weight: (!) 147.4 kg  Examination:  General exam: Appears calm and comfortable, not in any acute distress. Respiratory system: CTA Bilaterally. Respiratory effort normal.  RR 15 Cardiovascular system: S1 & S2 heard, RRR. No JVD, murmurs, rubs, gallops or clicks.  Gastrointestinal system: Abdomen is non distended, soft and non tender. Normal bowel sounds heard. Central nervous system: Alert  and oriented x 3. No focal neurological deficits. Extremities: No edema, No cyanosis, No clubbing Skin: No rashes, lesions or ulcers Psychiatry: Judgement and insight appear normal. Mood & affect appropriate.     Data Reviewed: I have personally reviewed following labs and imaging studies  CBC: Recent Labs  Lab 08/08/23 1847 08/09/23 1831 08/10/23 0600  WBC 10.4 10.2 8.4  NEUTROABS  --  6.0  --   HGB 14.6 14.6 14.9  HCT 45.8 45.3 45.8  MCV 81.6 81.8 81.2  PLT 237 260 246   Basic Metabolic Panel: Recent Labs  Lab 08/08/23 1847 08/09/23 1831 08/10/23 0600 08/11/23 0706  NA 134* 136 134* 138  K 3.5 3.8 3.7 3.9  CL 99 100 100 101  CO2 25 25 24 25   GLUCOSE 100* 103* 189* 155*  BUN 8 10 12 10   CREATININE 0.67 0.58* 0.59* 0.56*  CALCIUM 8.8* 9.1 9.3 9.6  MG  --   --  1.8 2.2  PHOS  --   --  1.6* 2.5   GFR: Estimated Creatinine Clearance: 193 mL/min (A) (by C-G formula based on SCr of 0.56 mg/dL (L)). Liver Function Tests: Recent Labs  Lab 08/10/23 0600  AST 20  ALT 17  ALKPHOS 78  BILITOT 0.7  PROT 7.6  ALBUMIN 3.9   No results for input(s): "LIPASE", "AMYLASE" in the last 168 hours. No results for input(s): "AMMONIA" in the last 168 hours. Coagulation Profile: No results for input(s): "INR", "PROTIME" in the last 168 hours. Cardiac Enzymes: No results for input(s): "CKTOTAL", "CKMB", "CKMBINDEX", "TROPONINI" in the last 168 hours. BNP (last 3 results) No results for input(s): "PROBNP" in the last 8760 hours. HbA1C: Recent Labs    08/10/23 1117  HGBA1C 5.1   CBG: Recent Labs  Lab 08/10/23 1437 08/10/23 1658 08/10/23 2105 08/11/23 0609 08/11/23 0822  GLUCAP 141* 150* 149* 144* 158*   Lipid Profile: No results for input(s): "CHOL", "HDL", "LDLCALC", "TRIG", "CHOLHDL", "LDLDIRECT" in the last 72 hours. Thyroid Function Tests: No results for input(s): "TSH", "T4TOTAL", "FREET4", "T3FREE", "THYROIDAB" in the last 72 hours. Anemia Panel: No results  for input(s): "VITAMINB12", "FOLATE", "FERRITIN", "TIBC", "IRON", "RETICCTPCT" in the last 72 hours. Sepsis Labs: No results for input(s): "PROCALCITON", "LATICACIDVEN" in the last 168 hours.  No results found for this or any previous visit (from the past 240 hours).   Radiology Studies: MR CERVICAL SPINE WO CONTRAST Result Date: 08/11/2023 CLINICAL DATA:  Optic neuritis. Suspected CSF leak with spontaneous intracranial hypotension. EXAM: MRI CERVICAL SPINE WITHOUT CONTRAST TECHNIQUE: Multiplanar, multisequence MR imaging of the cervical spine was performed. No intravenous contrast was administered. COMPARISON:  None Available. FINDINGS: Alignment: Normal Vertebrae: No fracture, evidence of discitis, or bone lesion. Cord: Normal signal and morphology. No evidence of canal collection. Posterior Fossa, vertebral arteries, paraspinal tissues: Negative. Disc levels: Minor endplate spurring at C3-4. IMPRESSION: Normal MRI of the cervical cord. Electronically Signed   By: Tiburcio Pea M.D.   On: 08/11/2023 06:01   DG CHEST PORT 1 VIEW Result Date: 08/10/2023 CLINICAL DATA:  Respiratory difficulty EXAM: PORTABLE CHEST 1 VIEW COMPARISON:  10/24/2021 FINDINGS: The heart size and mediastinal contours are  within normal limits. Both lungs are clear. The visualized skeletal structures are unremarkable. IMPRESSION: No active disease. Electronically Signed   By: Alcide Clever M.D.   On: 08/10/2023 20:19   MR ORBITS W WO CONTRAST Result Date: 08/09/2023 CLINICAL DATA:  Bilateral blurred vision. EXAM: MRI OF THE ORBITS WITHOUT AND WITH CONTRAST TECHNIQUE: Multiplanar, multi-echo pulse sequences of the orbits and surrounding structures were acquired including fat saturation techniques, before and after intravenous contrast administration. CONTRAST:  10mL GADAVIST GADOBUTROL 1 MMOL/ML IV SOLN COMPARISON:  MR head without and with contrast 08/09/2023 and 03/02/2022. MR of the head and orbits 02/05/2021. FINDINGS: Orbits:  Asymmetric edematous changes are present in the anterior right optic nerve with diffuse enhancement. No mass lesion is present. Left optic nerve is atrophic anteriorly. T2 hyperintensity is present posteriorly in the left optic nerve without associated enhancement. The globes are within normal limits. The extraocular muscles are normal. Visualized sinuses: The paranasal sinuses and mastoid air cells are clear. Soft tissues: The periorbital soft tissues are within normal limits. Limited intracranial: Within normal limits. IMPRESSION: 1. Asymmetric edematous changes in the anterior right optic nerve with diffuse enhancement. This is consistent with optic neuritis. 2. Atrophic left optic nerve anteriorly. 3. T2 hyperintensity posteriorly in the left optic nerve without associated enhancement. This is consistent with chronic optic neuritis. Electronically Signed   By: Marin Roberts M.D.   On: 08/09/2023 21:02   MR Brain W and Wo Contrast Result Date: 08/09/2023 CLINICAL DATA:  Bilateral blurred vision. EXAM: MRI HEAD WITHOUT AND WITH CONTRAST TECHNIQUE: Multiplanar, multiecho pulse sequences of the brain and surrounding structures were obtained without and with intravenous contrast. CONTRAST:  10mL GADAVIST GADOBUTROL 1 MMOL/ML IV SOLN COMPARISON:  Rest MR head without and with contrast 03/02/2022 FINDINGS: Brain: No acute infarct, hemorrhage, or mass lesion is present. No significant white matter lesions are present. Deep brain nuclei are within normal limits. The ventricles are of normal size. No significant extraaxial fluid collection is present. The brainstem and cerebellum are within normal limits. The internal auditory canals are within normal limits. Midline structures are within normal limits. Postcontrast images demonstrate no pathologic enhancement. Vascular: Flow is present in the major intracranial arteries. Skull and upper cervical spine: The craniocervical junction is normal. Upper cervical spine  is within normal limits. Marrow signal is unremarkable. Sinuses/Orbits: The paranasal sinuses and mastoid air cells are clear. The globes and orbits are within normal limits. IMPRESSION: Normal MRI appearance of the brain. No acute or focal lesion to explain the patient's symptoms. Electronically Signed   By: Marin Roberts M.D.   On: 08/09/2023 20:58   Scheduled Meds:  amLODipine  5 mg Oral Daily   enoxaparin (LOVENOX) injection  70 mg Subcutaneous Q24H   insulin aspart  0-15 Units Subcutaneous TID WC   nicotine  21 mg Transdermal Daily   pantoprazole (PROTONIX) IV  40 mg Intravenous Q24H   Continuous Infusions:  methylPREDNISolone (SOLU-MEDROL) injection 1,000 mg (08/10/23 2305)     LOS: 2 days    Time spent: 50 mins    Ian Niece, MD [facility]   If 7PM-7AM, please contact night-coverage

## 2023-08-11 NOTE — Plan of Care (Signed)
 Has had a good day today.  No attempts to smoke in room.  Has been very pleasant and calm and cooperative.  No complaints.    Problem: Clinical Measurements: Goal: Will remain free from infection Outcome: Progressing   Problem: Clinical Measurements: Goal: Diagnostic test results will improve Outcome: Progressing   Problem: Clinical Measurements: Goal: Respiratory complications will improve Outcome: Progressing   Problem: Clinical Measurements: Goal: Cardiovascular complication will be avoided Outcome: Progressing   Problem: Activity: Goal: Risk for activity intolerance will decrease Outcome: Progressing   Problem: Nutrition: Goal: Adequate nutrition will be maintained Outcome: Progressing   Problem: Coping: Goal: Level of anxiety will decrease Outcome: Progressing

## 2023-08-11 NOTE — Progress Notes (Addendum)
 NEUROLOGY CONSULT FOLLOW UP NOTE   Date of service: August 11, 2023 Patient Name: Ian Gonzalez MRN:  [id number] DOB:  1985-08-24  Interval Hx/subjective   -Reports vision is improving, now only a small area of his central vision remains blurry -Tolerated lumbar puncture well -No other complaints  Vitals   Vitals:   08/10/23 2334 08/11/23 0400 08/11/23 0825 08/11/23 1201  BP: 110/61 (!) 143/77 123/64 132/84  Pulse: 63 68 70 81  Resp: 16 18 20 20   Temp: 98 F (36.7 C) 97.8 F (36.6 C) 98 F (36.7 C) (!) 97.5 F (36.4 C)  TempSrc: Oral Axillary Oral Oral  SpO2: 95% 92% 94% 97%  Weight:      Height:         Body mass index is 42.88 kg/m.  Physical Exam   Constitutional: Appears well-developed and well-nourished.  Psych: Affect appropriate to situation, calm, cooperative Eyes: No scleral injection.  HENT: No OP obstrucion.  Poor dentition Head: Normocephalic. Cardiovascular: Normal rate and regular rhythm.  Respiratory: Effort normal, non-labored breathing.  GI: Soft.  No distension. There is no tenderness.  Skin: WDI.   Neurologic Examination   Mental status: Fluent casual speech, follows commands, alert, awake, fully oriented Cranial nerves: Hippus in bilateral pupils, difficult to assess for APD especially in the setting of now bilateral optic nerve lesions.  Denies right eye color desaturation.  Saccadic pursuits but EOMI.  Face symmetric, tongue midline, sensation intact bilaterally.  Hearing intact to tuning fork and symmetric Motor: 5/5 throughout Sensory: Reports intact to light touch throughout Reflexes: 2+ and brisk in the bilateral brachioradialis, biceps, patellae Coordination: Heel-to-shin and finger-nose intact bilaterally   Medications  Current Facility-Administered Medications:    acetaminophen (TYLENOL) tablet 650 mg, 650 mg, Oral, Q6H PRN, 650 mg at 08/10/23 2115 **OR** acetaminophen (TYLENOL) suppository 650 mg, 650 mg, Rectal, Q6H PRN, Adefeso,  [place], DO   amLODipine (NORVASC) tablet 5 mg, 5 mg, Oral, Daily, Johnson, Clanford L, MD, 5 mg at 08/11/23 1100   diphenhydrAMINE (BENADRYL) injection 25 mg, 25 mg, Intravenous, Once PRN, Erick Blinks, MD   enoxaparin (LOVENOX) injection 70 mg, 70 mg, Subcutaneous, Q24H, Adefeso, [place], DO, 70 mg at 08/11/23 1100   hydrALAZINE (APRESOLINE) injection 10 mg, 10 mg, Intravenous, Q4H PRN, Johnson, Clanford L, MD   insulin aspart (novoLOG) injection 0-15 Units, 0-15 Units, Subcutaneous, TID WC, Johnson, Clanford L, MD, 2 Units at 08/11/23 1234   LORazepam (ATIVAN) injection 1 mg, 1 mg, Intravenous, Q15 min PRN, Erick Blinks, MD, 1 mg at 08/11/23 0230   melatonin tablet 6 mg, 6 mg, Oral, QHS PRN, Dolly Rias, MD, 6 mg at 08/10/23 2044   methylPREDNISolone sodium succinate (SOLU-MEDROL) 1,000 mg in sodium chloride 0.9 % 50 mL IVPB, 1,000 mg, Intravenous, Q24H **AND** pantoprazole (PROTONIX) injection 40 mg, 40 mg, Intravenous, Q24H, Sallie Maker L, MD   [START ON 08/12/2023] methylPREDNISolone sodium succinate (SOLU-MEDROL) 1,000 mg in sodium chloride 0.9 % 50 mL IVPB, 1,000 mg, Intravenous, Q24H **FOLLOWED BY** [START ON 08/13/2023] methylPREDNISolone sodium succinate (SOLU-MEDROL) 1,000 mg in sodium chloride 0.9 % 50 mL IVPB, 1,000 mg, Intravenous, Q24H, Abdulhamid Olgin L, MD   nicotine (NICODERM CQ - dosed in mg/24 hours) patch 21 mg, 21 mg, Transdermal, Daily, Adefeso, Oladapo, DO, 21 mg at 08/10/23 1103   ondansetron (ZOFRAN) tablet 4 mg, 4 mg, Oral, Q6H PRN **OR** ondansetron (ZOFRAN) injection 4 mg, 4 mg, Intravenous, Q6H PRN, Adefeso, Oladapo, DO   [START ON 08/12/2023] pantoprazole (PROTONIX)  injection 40 mg, 40 mg, Intravenous, Q24H, Marli Diego L, MD  Labs and Diagnostic Imaging    Pending optic neuritis labs:  Anti-MOG Ab, JC Virus, AQP4 channel Ab, serum ACE, Paraneoplastic panel.  Basic Metabolic Panel: Recent Labs  Lab 08/08/23 1847 08/09/23 1831 08/10/23 0600  08/11/23 0706  NA 134* 136 134* 138  K 3.5 3.8 3.7 3.9  CL 99 100 100 101  CO2 25 25 24 25   GLUCOSE 100* 103* 189* 155*  BUN 8 10 12 10   CREATININE 0.67 0.58* 0.59* 0.56*  CALCIUM 8.8* 9.1 9.3 9.6  MG  --   --  1.8 2.2  PHOS  --   --  1.6* 2.5    CBC: Recent Labs  Lab 08/08/23 1847 08/09/23 1831 08/10/23 0600  WBC 10.4 10.2 8.4  NEUTROABS  --  6.0  --   HGB 14.6 14.6 14.9  HCT 45.8 45.3 45.8  MCV 81.6 81.8 81.2  PLT 237 260 246    Coagulation Studies: No results for input(s): "LABPROT", "INR" in the last 72 hours.    Lipid Panel:  Lab Results  Component Value Date   LDLCALC 118 (H) 05/15/2021   HgbA1c:  Lab Results  Component Value Date   HGBA1C 5.1 08/10/2023   Urine Drug Screen: No results found for: "LABOPIA", "COCAINSCRNUR", "LABBENZ", "AMPHETMU", "THCU", "LABBARB"  Alcohol Level No results found for: "ETH" INR  Lab Results  Component Value Date   INR 1.0 02/07/2021   APTT  Lab Results  Component Value Date   APTT 26 02/07/2021    MRI C-spine, MRI T-spine Normal MRI of the cervical cord.  MRI thoracic spine 1. Incomplete and insufficient examination as the patient could not complete the entire exam. We are being requested to interpret the images that we have. I would like stress that this is not a sufficient exam. 2. 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 As seen previously, there is anterior deviation of the cord at the T6 and T7 level with some cord deformity. Increased T2 signal seen dorsal to the cord. This is presumed to represent an arachnoid cyst or arachnoid web, though anterior cord herniation could be considered. 3. No evidence of subdural or epidural fluid collection to suggest CSF leak. 4. Mild bulging of the disc spaces at T4-5, T5-6, T7-8 and T8-9 without compressive stenosis of the canal or foramina.   MRI Brain and Orbits w/ and w/o (Personally reviewed): 1. Asymmetric edematous changes in the anterior right  optic nerve with diffuse enhancement. This is consistent with optic neuritis. 2. Atrophic left optic nerve anteriorly. 3. T2 hyperintensity posteriorly in the left optic nerve without associated enhancement. This is consistent with chronic optic neuritis.  Chest x-ray without evidence for sarcoidosis  CSF protein 91, glucose 103 (serum 121) Cell counts, oligoclonal bands, IgG index, CSF paraneoplastic panel pending   Assessment   Ian Gonzalez is a 38 y.o. male obesity, depression/anxiety, insomnia, GERD, prior hx of L optic neuritis in 2022 and tobacco abuse who presented to the emergency department due to decreased vision in the right eye which started on 08/04/2023. MRI Brain with R optic neuritis.   Recurrent optic neuritis is associated with high risk of developing MS. NMOSD, MOGAD also high on differential. Also considering CRION, Paraneoplastic optic neuropathy(CRMP5/CV2 Ab).  Recommendations  # Recurrent optic neuritis now in the right eye - IR LP ordered, labs pending as above - Continue Solu Medrol 1g daily x 5 doses (made q20  hour dosing to achieve a more physiological timing). PPI while on steroids. - Follow up with Dr. Shon Millet.  # Smoking 5 min spent in discussion - Counseled on the importance of quitting smoking to reduce risk of health issues; including association with worsened outcomes in autoimmune disease. Discussed that nicotine withdrawal symptoms peak in the first three days of smoking cessation and subside over the next three to four weeks. Discussed options including counseling, pharmacological options including Nicotine patch, Chantix, Wellbutrin. We discussed quit date. I also provided the patient a handout with resources and free resources offered by the state of [place] on "https://fox-cook.com/". Patient will also reach out to PCP on followup.  Discussed with primary team.  Patient is improving and no further inpatient neurological workup is  planned at this time.  Neurology will follow-up cell counts from CSF studies, otherwise we we will sign off but please do not hesitate to reach out if any other questions or concerns arise  Addendum Cell counts reviewed c/w traumatic tap +/- nonspecific protein elevation, no change in plan as above  Latest Reference Range & Units 08/11/23 14:27  Appearance, CSF CLEAR  HAZY !  Glucose, CSF 40 - 70 mg/dL 161 (H)  RBC Count, CSF 0 /cu mm 6,200 (H)  WBC, CSF 0 - 5 /cu mm 2  Other Cells, CSF  TOO FEW TO COUNT, SMEAR AVAILABLE FOR REVIEW  Color, CSF COLORLESS  PINK !  Supernatant  COLORLESS  Total  Protein, CSF 15 - 45 mg/dL 91 (H)  Tube #  1  !: Data is abnormal (H): Data is abnormally high ______________________________________________________________________  Brooke Dare MD-PhD [facility] [phone]

## 2023-08-11 NOTE — Plan of Care (Signed)
  Problem: Education: Goal: Knowledge of General Education information will improve Description: Including pain rating scale, medication(s)/side effects and non-pharmacologic comfort measures Outcome: Progressing   Problem: Clinical Measurements: Goal: Ability to maintain clinical measurements within normal limits will improve Outcome: Progressing   Problem: Clinical Measurements: Goal: Will remain free from infection Outcome: Progressing   Problem: Activity: Goal: Risk for activity intolerance will decrease Outcome: Progressing   Problem: Pain Managment: Goal: General experience of comfort will improve and/or be controlled Outcome: Progressing

## 2023-08-11 NOTE — Progress Notes (Signed)
 Transported back to unit via bed. MRI scans were not completed as patient refused to continue the procedure as per RT.

## 2023-08-12 ENCOUNTER — Other Ambulatory Visit (HOSPITAL_COMMUNITY): Payer: Self-pay

## 2023-08-12 LAB — CBC
HCT: 46.7 % (ref 39.0–52.0)
Hemoglobin: 15.2 g/dL (ref 13.0–17.0)
MCH: 26.2 pg (ref 26.0–34.0)
MCHC: 32.5 g/dL (ref 30.0–36.0)
MCV: 80.4 fL (ref 80.0–100.0)
Platelets: 269 10*3/uL (ref 150–400)
RBC: 5.81 MIL/uL (ref 4.22–5.81)
RDW: 13.8 % (ref 11.5–15.5)
WBC: 12.4 10*3/uL — ABNORMAL HIGH (ref 4.0–10.5)
nRBC: 0 % (ref 0.0–0.2)

## 2023-08-12 LAB — ANGIOTENSIN CONVERTING ENZYME: Angiotensin-Converting Enzyme: 40 U/L (ref 14–82)

## 2023-08-12 LAB — MISC LABCORP TEST (SEND OUT): Labcorp test code: 505310

## 2023-08-12 LAB — GLUCOSE, CAPILLARY
Glucose-Capillary: 167 mg/dL — ABNORMAL HIGH (ref 70–99)
Glucose-Capillary: 192 mg/dL — ABNORMAL HIGH (ref 70–99)

## 2023-08-12 LAB — BASIC METABOLIC PANEL
Anion gap: 9 (ref 5–15)
BUN: 11 mg/dL (ref 6–20)
CO2: 26 mmol/L (ref 22–32)
Calcium: 9.4 mg/dL (ref 8.9–10.3)
Chloride: 100 mmol/L (ref 98–111)
Creatinine, Ser: 0.67 mg/dL (ref 0.61–1.24)
GFR, Estimated: >60 mL/min (ref >60)
Glucose, Bld: 165 mg/dL — ABNORMAL HIGH (ref 70–99)
Potassium: 4.2 mmol/L (ref 3.5–5.1)
Sodium: 135 mmol/L (ref 135–145)

## 2023-08-12 LAB — PHOSPHORUS: Phosphorus: 3.6 mg/dL (ref 2.5–4.6)

## 2023-08-12 LAB — MAGNESIUM: Magnesium: 2.1 mg/dL (ref 1.7–2.4)

## 2023-08-12 MED ORDER — AMLODIPINE BESYLATE 5 MG PO TABS
5.0000 mg | ORAL_TABLET | Freq: Every day | ORAL | 1 refills | Status: DC
  Filled 2023-08-12: qty 30, 30d supply, fill #0

## 2023-08-12 MED ORDER — PANTOPRAZOLE SODIUM 40 MG PO TBEC
40.0000 mg | DELAYED_RELEASE_TABLET | Freq: Every day | ORAL | 1 refills | Status: DC
  Filled 2023-08-12: qty 30, 30d supply, fill #0

## 2023-08-12 MED ORDER — PREDNISONE 50 MG PO TABS
1250.0000 mg | ORAL_TABLET | Freq: Once | ORAL | 0 refills | Status: AC
  Filled 2023-08-12: qty 25, 1d supply, fill #0

## 2023-08-12 NOTE — Progress Notes (Signed)
 NEUROLOGY CONSULT FOLLOW UP NOTE   Date of service: August 12, 2023 Patient Name: Ian Gonzalez MRN:  [id number] DOB:  1986/01/15  Interval Hx/subjective   -Reports vision almost normal now, eager for discharge  Vitals   Vitals:   08/11/23 1559 08/11/23 2309 08/12/23 0405 08/12/23 0801  BP: 133/80 132/75 111/79 (!) 160/89  Pulse: 91 74 71 60  Resp: 18 16 18 20   Temp: (!) 97.5 F (36.4 C) 98.2 F (36.8 C) (!) 97.5 F (36.4 C) 98.3 F (36.8 C)  TempSrc: Oral Oral Oral Oral  SpO2: 99% 95% 97% 97%  Weight:      Height:         Body mass index is 42.88 kg/m.  Physical Exam   Constitutional: Appears well-developed and well-nourished.  Psych: Affect appropriate to situation, mildly anxious but redirectable Eyes: No scleral injection.  HENT: No OP obstrucion.  Poor dentition Head: Normocephalic. Cardiovascular: Normal rate and regular rhythm.  Respiratory: Effort normal, non-labored breathing.  GI: Soft.  No distension. There is no tenderness.    Neurologic Examination   Mental status: Fluent casual speech, follows commands, alert, awake, fully oriented Cranial nerves: Tracking examiner, face symmetric, tongue midline, hearing intact to voice Motor: Using bilateral upper extremities equally to gesture when speaking without asymmetry Gait: Normal casual gait   Medications  Current Facility-Administered Medications:    acetaminophen (TYLENOL) tablet 650 mg, 650 mg, Oral, Q6H PRN, 650 mg at 08/10/23 2115 **OR** acetaminophen (TYLENOL) suppository 650 mg, 650 mg, Rectal, Q6H PRN, Adefeso, Oladapo, DO   amLODipine (NORVASC) tablet 5 mg, 5 mg, Oral, Daily, Johnson, Clanford L, MD, 5 mg at 08/11/23 1100   diphenhydrAMINE (BENADRYL) injection 25 mg, 25 mg, Intravenous, Once PRN, Erick Blinks, MD   enoxaparin (LOVENOX) injection 70 mg, 70 mg, Subcutaneous, Q24H, Adefeso, Oladapo, DO, 70 mg at 08/11/23 1100   hydrALAZINE (APRESOLINE) injection 10 mg, 10 mg, Intravenous,  Q4H PRN, Johnson, Clanford L, MD   insulin aspart (novoLOG) injection 0-15 Units, 0-15 Units, Subcutaneous, TID WC, Johnson, Clanford L, MD, 3 Units at 08/12/23 0710   LORazepam (ATIVAN) injection 1 mg, 1 mg, Intravenous, Q15 min PRN, Erick Blinks, MD, 1 mg at 08/11/23 0230   melatonin tablet 6 mg, 6 mg, Oral, QHS PRN, Dolly Rias, MD, 6 mg at 08/11/23 2056   methylPREDNISolone sodium succinate (SOLU-MEDROL) 1,000 mg in sodium chloride 0.9 % 50 mL IVPB, 1,000 mg, Intravenous, Q24H **FOLLOWED BY** [START ON 08/13/2023] methylPREDNISolone sodium succinate (SOLU-MEDROL) 1,000 mg in sodium chloride 0.9 % 50 mL IVPB, 1,000 mg, Intravenous, Q24H, Veasna Santibanez L, MD   nicotine (NICODERM CQ - dosed in mg/24 hours) patch 21 mg, 21 mg, Transdermal, Daily, Adefeso, Oladapo, DO, 21 mg at 08/10/23 1103   ondansetron (ZOFRAN) tablet 4 mg, 4 mg, Oral, Q6H PRN **OR** ondansetron (ZOFRAN) injection 4 mg, 4 mg, Intravenous, Q6H PRN, Adefeso, Oladapo, DO   pantoprazole (PROTONIX) injection 40 mg, 40 mg, Intravenous, Q24H, Conn Trombetta L, MD, 40 mg at 08/12/23 0845  Labs and Diagnostic Imaging    Pending optic neuritis labs:  Anti-MOG Ab, JC Virus, AQP4 channel Ab, serum ACE, Paraneoplastic panel.  Basic Metabolic Panel: Recent Labs  Lab 08/08/23 1847 08/09/23 1831 08/10/23 0600 08/11/23 0706 08/12/23 0648  NA 134* 136 134* 138 135  K 3.5 3.8 3.7 3.9 4.2  CL 99 100 100 101 100  CO2 25 25 24 25 26   GLUCOSE 100* 103* 189* 155* 165*  BUN 8 10 12  10  11  CREATININE 0.67 0.58* 0.59* 0.56* 0.67  CALCIUM 8.8* 9.1 9.3 9.6 9.4  MG  --   --  1.8 2.2 2.1  PHOS  --   --  1.6* 2.5 3.6    CBC: Recent Labs  Lab 08/08/23 1847 08/09/23 1831 08/10/23 0600 08/12/23 0648  WBC 10.4 10.2 8.4 12.4*  NEUTROABS  --  6.0  --   --   HGB 14.6 14.6 14.9 15.2  HCT 45.8 45.3 45.8 46.7  MCV 81.6 81.8 81.2 80.4  PLT 237 260 246 269    Coagulation Studies: No results for input(s): "LABPROT", "INR" in the  last 72 hours.    Lipid Panel:  Lab Results  Component Value Date   LDLCALC 118 (H) 05/15/2021   HgbA1c:  Lab Results  Component Value Date   HGBA1C 5.1 08/10/2023   Urine Drug Screen: No results found for: "LABOPIA", "COCAINSCRNUR", "LABBENZ", "AMPHETMU", "THCU", "LABBARB"  Alcohol Level No results found for: "ETH" INR  Lab Results  Component Value Date   INR 1.0 02/07/2021   APTT  Lab Results  Component Value Date   APTT 26 02/07/2021    MRI C-spine, MRI T-spine Normal MRI of the cervical cord.  MRI thoracic spine 1. Incomplete and insufficient examination as the patient could not complete the entire exam. We are being requested to interpret the images that we have. I would like stress that this is not a sufficient exam. 2. 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 As seen previously, there is anterior deviation of the cord at the T6 and T7 level with some cord deformity. Increased T2 signal seen dorsal to the cord. This is presumed to represent an arachnoid cyst or arachnoid web, though anterior cord herniation could be considered. 3. No evidence of subdural or epidural fluid collection to suggest CSF leak. 4. Mild bulging of the disc spaces at T4-5, T5-6, T7-8 and T8-9 without compressive stenosis of the canal or foramina.   MRI Brain and Orbits w/ and w/o (Personally reviewed): 1. Asymmetric edematous changes in the anterior right optic nerve with diffuse enhancement. This is consistent with optic neuritis. 2. Atrophic left optic nerve anteriorly. 3. T2 hyperintensity posteriorly in the left optic nerve without associated enhancement. This is consistent with chronic optic neuritis.  Chest x-ray without evidence for sarcoidosis  CSF RBC 6200, WBC 2, protein 91, glucose 103 (serum 121) Cell counts, oligoclonal bands, IgG index, CSF paraneoplastic panel pending   Assessment   Ian Gonzalez is a 38 y.o. male obesity, depression/anxiety, insomnia,  GERD, prior hx of L optic neuritis in 2022 and tobacco abuse who presented to the emergency department due to decreased vision in the right eye which started on 08/04/2023. MRI Brain with R optic neuritis.   Recurrent optic neuritis is associated with high risk of developing MS. NMOSD, MOGAD also high on differential. Also considering CRION, Paraneoplastic optic neuropathy(CRMP5/CV2 Ab).  Patient is expressing a strong preference for discharge and completing his steroids on an outpatient basis and there is some data for doing so: Oral rather than intravenous corticosteroids should be used to treat MS relapses - Jodie Rennis Petty al 2017 [email or web address]  Recommendations  # Recurrent optic neuritis now in the right eye - IR LP ordered, labs pending as above - Continue Solu Medrol 1g daily x fourth dose today - 1250 mg p.o. steroids tomorrow per patient preference, continue pantoprazole while on steroids - Follow up with Dr. Shon Millet.  # Smoking: -  Counseled on the importance of quitting smoking to reduce risk of health issues; including association with worsened outcomes in autoimmune disease. Discussed that nicotine withdrawal symptoms peak in the first three days of smoking cessation and subside over the next three to four weeks. Discussed options including counseling, pharmacological options including Nicotine patch, Chantix, Wellbutrin. We discussed quit date. I also provided the patient a handout with resources and free resources offered by the state of [place] on "https://fox-cook.com/". Patient will also reach out to PCP on followup.  Discussed with primary team.  Patient is improving and no further inpatient neurological workup is planned at this time.  Neurology will sign off but please do not hesitate to reach out if any other questions or concerns arise  ______________________________________________________________________  Brooke Dare  MD-PhD [facility] [phone]

## 2023-08-12 NOTE — Progress Notes (Signed)
 Discharge instructions reviewed with pt and all questioned answered by Enid Derry, the [facility] nurse who did his discharge instructions.  Going to be taken to the pharmacy to pick up his meds.  Given back his belongings that were removed for safety while he is here, as he was smoking in room so vape and cigarettes and lighter returned back to him.

## 2023-08-12 NOTE — Discharge Instructions (Signed)
 Advised to follow-up with primary care physician in 1 week. Advised to follow-up with neurology as scheduled. Patient wants to be discharged. As per neurology patient needs 1250 mg of prednisone tomorrow to complete 5-day treatment for optic neuritis.

## 2023-08-12 NOTE — TOC Transition Note (Signed)
 Transition of Care [facility]) - Discharge Note   Patient Details  Name: Ian Gonzalez MRN: [id number] Date of Birth: 04-09-1986  [facility] CM/SW Contact:  Kermit Balo, RN Phone Number: 08/12/2023, 11:36 AM   Clinical Narrative:     Pt is discharging home with self care.  No health insurance and no PCP. CM has added the low income clinics to his AVS. He will need to call and go by the clinic to fill out paperwork before getting an appointment. Pt is aware.  Pts mother provides needed transportation.  Pt wasn't taking any prescription medications other than an antibiotic prior to admission.  MATCH provided as pt states he has no money for medications.   MATCH MEDICATION ASSISTANCE CARD Pharmacies please call: [phone] for claim processing assistance.  Rx BIN: [id number] Rx Group: [id number] Rx PCN: [id number] Relationship Code: 1 Person Code: 01  Patient ID (MRN): [id number]    Patient Name: Ian Gonzalez    Patient DOB:25-Apr-1986    Discharge Date:08/12/2023    Expiration Date:08/19/2023 (must be filled within 7 days of discharge)   Mother transporting home.   Final next level of care: Home/Self Care Barriers to Discharge: Inadequate or no insurance, Barriers Unresolved (comment)   Patient Goals and CMS Choice            Discharge Placement                       Discharge Plan and Services Additional resources added to the After Visit Summary for                                       Social Drivers of Health (SDOH) Interventions SDOH Screenings   Food Insecurity: Patient Declined (08/10/2023)  Housing: Unknown (08/10/2023)  Transportation Needs: No Transportation Needs (08/10/2023)  Utilities: Not At Risk (08/10/2023)  Depression (PHQ2-9): Low Risk  (02/21/2022)  Tobacco Use: High Risk (08/09/2023)     Readmission Risk Interventions     No data to display

## 2023-08-12 NOTE — Discharge Summary (Signed)
 Physician Discharge Summary  Ian Gonzalez HKV:[id number] DOB: [birth year] DOA: 08/09/2023  PCP: Anabel Halon, MD  Admit date: 08/09/2023  Discharge date: 08/12/2023  Admitted From: Home  Disposition:  Home  Recommendations for Outpatient Follow-up:  Follow up with PCP in 1-2 weeks. Please obtain BMP/CBC in one week Advised to follow-up with Neurology as scheduled. Patient wants to be discharged. As per neurology patient needs 1250 mg of prednisone tomorrow to complete 5-day treatment for optic neuritis.  Home Health:None Equipment/Devices:None  Discharge Condition: Stable CODE STATUS:Full code Diet recommendation: Heart Healthy   Brief [facility] Course: This 38 yrs. old male with PMH significant for obesity, depression / anxiety, insomnia, GERD, and tobacco abuse who presented to the ED due to decreased vision in the right eye which started on Sunday (2/23). He went to an urgent care 5 days ago due to a dental abscess and was prescribed with Augmentin. Patient complained of pressure behind his right eye and intermittent headaches that recurs every 2-3 hours.  He was seen by Neurologist in 2022 for left eye vision loss consistent with optic neuritis, he was treated with IV Solu-Medrol and it was not certain if patient followed up with the neurology after that. Repeat MRI of brain and cervical spine done in 2023 showed no evidence of demyelinating disease. MRI brain without and with contrast showed normal MRI appearance of the brain. MRI of the orbits without and with contrast showed asymmetric edematous changes in the anterior right optic nerve with diffuse enhancement. This is consistent with optic neuritis.EDP discussed the case with Neurologist, recommended to transfer to [facility]. Patient was continued on high-dose IV solumedrol.  Patient felt much improved.  Patient wants to be discharged.  Neurology agreeable with the discharge planning.  Patient is being prescribed  prednisone 1250 mg tomorrow to complete 5-day treatment for optic neuritis advised to follow-up with neurology outpatient.  Patient is being discharged home.  Discharge Diagnoses:  Principal Problem:   Optic neuritis Active Problems:   Tobacco abuse   H/O PSVT (paroxysmal supraventricular tachycardia)   Gastroesophageal reflux disease   Subclinical hyperthyroidism   Obesity, Class III, BMI 40-49.9 (morbid obesity) (HCC)   Hypophosphatemia   Essential (primary) hypertension  Right Optic neuritis, recurrent episode: Continue IV Solu-Medrol 1 g daily for 5 days. Patient evaluated by neurology recommended workup for MS. Obtain MRI with and without contrast cervical and thoracolumbar spine. Patient may need LP with CSF evaluation.  Hold until MRI is completed. Obtain Anti-MOG Ab, JC virus, AQP 4 channel Ab, serum ACE, paraneoplastic panel. Infectious causes ruled out. Workup in process.  Patient wants to be discharged.   Patient being discharged home on prednisone 1250 mg once tomorrow. Patient showed some improvement.    Morbid obesity (BMI 42.88) Diet and exercise discussed in detail.   Tobacco abuse Patient was counseled on tobacco abuse cessation. Nicotine patch will be provided.   Hypophosphatemia: Replaced.  Continue to monitor   GERD: Anticipating exacerbation due to high dose steroids Continue  pantoprazole 40 mg BID    Hyperglycemia - steroid - induced : BS goal 140-180 in hospital Continue regular insulin sliding scale. if needed, would add temporary prandial/basal coverage while on steroids   Essential Hypertension: Continue amlodipine 5 mg daily  Discharge Instructions  Discharge Instructions     Call MD for:  difficulty breathing, headache or visual disturbances   Complete by: As directed    Call MD for:  persistant dizziness or light-headedness  Complete by: As directed    Call MD for:  severe uncontrolled pain   Complete by: As directed    Diet -  low sodium heart healthy   Complete by: As directed    Diet Carb Modified   Complete by: As directed    Discharge instructions   Complete by: As directed    Advised to follow-up with primary care physician in 1 week. Advised to follow-up with neurology as scheduled. Patient wants to be discharged. As per neurology patient needs 1250 mg of prednisone tomorrow to complete 5-day treatment for optic neuritis.   Increase activity slowly   Complete by: As directed       Allergies as of 08/12/2023   No Known Allergies      Medication List     STOP taking these medications    amoxicillin-clavulanate 875-125 MG tablet Commonly known as: AUGMENTIN       TAKE these medications    amLODipine 5 MG tablet Commonly known as: NORVASC Take 1 tablet (5 mg total) by mouth daily.   chlorhexidine 0.12 % solution Commonly known as: Peridex Use as directed 15 mLs in the mouth or throat 2 (two) times daily.   ibuprofen 200 MG tablet Commonly known as: ADVIL Take 400 mg by mouth every 6 (six) hours as needed for mild pain (pain score 1-3).   lidocaine 2 % solution Commonly known as: XYLOCAINE Use as directed 10 mLs in the mouth or throat every 3 (three) hours as needed.   pantoprazole 40 MG tablet Commonly known as: Protonix Take 1 tablet (40 mg total) by mouth daily.   predniSONE 50 MG tablet Commonly known as: DELTASONE Take 25 tablets (1,250 mg total) by mouth once for 1 dose. Optic Neuritis        Follow-up Information     [facility] Follow up in 1 week(s).   Contact information: [address] [place] [address] [phone]         [facility] Follow up.   Why: Call to see about getting into one of the clinics listed. Contact information: [address]  [phone]  [facility] [address] [place], [place] [phone]               No Known  Allergies  Consultations: Neurology   Procedures/Studies: MR THORACIC SPINE WO CONTRAST Result Date: 08/11/2023 CLINICAL DATA:  Optic neuritis. Suspected CSF leak. Spontaneous intracranial hypotension. EXAM: MRI THORACIC SPINE WITHOUT CONTRAST TECHNIQUE: Multiplanar, multisequence MR imaging of the thoracic spine was performed. No intravenous contrast was administered. COMPARISON:  MRI 02/08/2021 FINDINGS: The initial scan does not include the entire left side of the spine and spinal canal as the patient crawled out of the scanner. Judging from previous communication documented between the [facility] and technologists, we have been unable to get this patient back for repeat imaging. I would consider this an incomplete and insufficient examination, but we are being requested to interpret the images that we have. I would like stress that this is not a sufficient exam. Alignment: No malalignment. 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 Cord: As seen previously, there is anterior deviation of the cord at the T6 and T7 level with some cord deformity. Increased T2 signal seen dorsal to the cord. This is presumed to represent an arachnoid cyst or arachnoid web, though anterior cord herniation could be considered. Paraspinal and other soft tissues: Negative  Disc levels: I do not see a subdural or epidural fluid collection to suggest CSF leak. No significant disc level finding from T1-2 through T3-4. T4-5, T5-6, T7-8 and T8-9: Mild bulging of the disc spirit no compressive stenosis of the canal. Left foramina not visible on the sagittal imaging, but no stenosis suspected based on the axial images. T9-10 through T12-L1: No significant finding. IMPRESSION: 1. Incomplete and insufficient examination as the patient could not complete the entire exam. We are being requested to interpret the images that we have. I would like stress that this is not a sufficient exam. 2. Congenital failure of  segmentation at T6 and T7. As seen previously, there is anterior deviation of the cord at the T6 and T7 level with some cord deformity. Increased T2 signal seen dorsal to the cord. This is presumed to represent an arachnoid cyst or arachnoid web, though anterior cord herniation could be considered. 3. No evidence of subdural or epidural fluid collection to suggest CSF leak. 4. Mild bulging of the disc spaces at T4-5, T5-6, T7-8 and T8-9 without compressive stenosis of the canal or foramina. Electronically Signed   By: Paulina Fusi M.D.   On: 08/11/2023 16:24   DG FL GUIDED LUMBAR PUNCTURE Result Date: 08/11/2023 CLINICAL DATA:  Blurred vision, right sided optic neuritis seen on MR. Request for diagnostic lumbar puncture. EXAM: DIAGNOSTIC LUMBAR PUNCTURE UNDER FLUOROSCOPIC GUIDANCE FLUOROSCOPY TIME:  Radiation Exposure Index (as provided by the fluoroscopic device): 13.80 mGy Number of Acquired Images:  2 PROCEDURE: Informed consent was obtained from the patient prior to the procedure, including potential complications of headache, allergy, and pain. With the patient prone, the lower back was prepped with Betadine. 1% Lidocaine was used for local anesthesia. Lumbar puncture was performed at the L4-L5 level using a 20 gauge, 5 inch needle with return of pink/blood-tinged CSF with an opening pressure of 17 cm water. Very slow flow of CSF noted, and only 2.5 ml of CSF were obtained for laboratory studies. The patient tolerated the procedure well and there were no apparent complications. IMPRESSION: Technically successful lumbar puncture from L4-L5 level as described above. Procedure performed by Brayton El PA-C supervised by Dr. Marliss Coots Electronically Signed   By: Marliss Coots M.D.   On: 08/11/2023 15:12   MR CERVICAL SPINE WO CONTRAST Result Date: 08/11/2023 CLINICAL DATA:  Optic neuritis. Suspected CSF leak with spontaneous intracranial hypotension. EXAM: MRI CERVICAL SPINE WITHOUT CONTRAST TECHNIQUE:  Multiplanar, multisequence MR imaging of the cervical spine was performed. No intravenous contrast was administered. COMPARISON:  None Available. FINDINGS: Alignment: Normal Vertebrae: No fracture, evidence of discitis, or bone lesion. Cord: Normal signal and morphology. No evidence of canal collection. Posterior Fossa, vertebral arteries, paraspinal tissues: Negative. Disc levels: Minor endplate spurring at C3-4. IMPRESSION: Normal MRI of the cervical cord. Electronically Signed   By: Tiburcio Pea M.D.   On: 08/11/2023 06:01   DG CHEST PORT 1 VIEW Result Date: 08/10/2023 CLINICAL DATA:  Respiratory difficulty EXAM: PORTABLE CHEST 1 VIEW COMPARISON:  10/24/2021 FINDINGS: The heart size and mediastinal contours are within normal limits. Both lungs are clear. The visualized skeletal structures are unremarkable. IMPRESSION: No active disease. Electronically Signed   By: Alcide Clever M.D.   On: 08/10/2023 20:19   MR ORBITS W WO CONTRAST Result Date: 08/09/2023 CLINICAL DATA:  Bilateral blurred vision. EXAM: MRI OF THE ORBITS WITHOUT AND WITH CONTRAST TECHNIQUE: Multiplanar, multi-echo pulse sequences of the orbits and surrounding structures were acquired including fat saturation  techniques, before and after intravenous contrast administration. CONTRAST:  10mL GADAVIST GADOBUTROL 1 MMOL/ML IV SOLN COMPARISON:  MR head without and with contrast 08/09/2023 and 03/02/2022. MR of the head and orbits 02/05/2021. FINDINGS: Orbits: Asymmetric edematous changes are present in the anterior right optic nerve with diffuse enhancement. No mass lesion is present. Left optic nerve is atrophic anteriorly. T2 hyperintensity is present posteriorly in the left optic nerve without associated enhancement. The globes are within normal limits. The extraocular muscles are normal. Visualized sinuses: The paranasal sinuses and mastoid air cells are clear. Soft tissues: The periorbital soft tissues are within normal limits. Limited  intracranial: Within normal limits. IMPRESSION: 1. Asymmetric edematous changes in the anterior right optic nerve with diffuse enhancement. This is consistent with optic neuritis. 2. Atrophic left optic nerve anteriorly. 3. T2 hyperintensity posteriorly in the left optic nerve without associated enhancement. This is consistent with chronic optic neuritis. Electronically Signed   By: Marin Roberts M.D.   On: 08/09/2023 21:02   MR Brain W and Wo Contrast Result Date: 08/09/2023 CLINICAL DATA:  Bilateral blurred vision. EXAM: MRI HEAD WITHOUT AND WITH CONTRAST TECHNIQUE: Multiplanar, multiecho pulse sequences of the brain and surrounding structures were obtained without and with intravenous contrast. CONTRAST:  10mL GADAVIST GADOBUTROL 1 MMOL/ML IV SOLN COMPARISON:  Rest MR head without and with contrast 03/02/2022 FINDINGS: Brain: No acute infarct, hemorrhage, or mass lesion is present. No significant white matter lesions are present. Deep brain nuclei are within normal limits. The ventricles are of normal size. No significant extraaxial fluid collection is present. The brainstem and cerebellum are within normal limits. The internal auditory canals are within normal limits. Midline structures are within normal limits. Postcontrast images demonstrate no pathologic enhancement. Vascular: Flow is present in the major intracranial arteries. Skull and upper cervical spine: The craniocervical junction is normal. Upper cervical spine is within normal limits. Marrow signal is unremarkable. Sinuses/Orbits: The paranasal sinuses and mastoid air cells are clear. The globes and orbits are within normal limits. IMPRESSION: Normal MRI appearance of the brain. No acute or focal lesion to explain the patient's symptoms. Electronically Signed   By: Marin Roberts M.D.   On: 08/09/2023 20:58   MR Venogram Head Result Date: 08/09/2023 CLINICAL DATA:  Dural venous sinus thrombosis suspected. Bilateral vision loss.  EXAM: MR VENOGRAM HEAD WITHOUT AND WITH CONTRAST TECHNIQUE: Angiographic images of the intracranial venous structures were acquired using MRV technique without and with intravenous contrast. CONTRAST:  10mL GADAVIST GADOBUTROL 1 MMOL/ML IV SOLN COMPARISON:  Head MRI 03/02/2022 FINDINGS: The superior sagittal sinus, internal cerebral veins, vein of Galen, straight sinus, transverse sinuses, sigmoid sinuses, and jugular bulbs are patent without evidence of thrombus. The right transverse and sigmoid sinuses are dominant. The left transverse sinus is hypoplastic but patent with an unchanged arachnoid granulation in its midportion. No abnormal intracranial enhancement is identified. IMPRESSION: Negative MR venogram of the head. Electronically Signed   By: Sebastian Ache M.D.   On: 08/09/2023 07:44      Subjective: Patient was seen and examined at bedside.  Overnight events noted.   Patient reports doing much better and wants to be discharged.  Discharge Exam: Vitals:   08/12/23 0801 08/12/23 1130  BP: (!) 160/89 129/84  Pulse: 60 72  Resp: 20 18  Temp: 98.3 F (36.8 C) 98.4 F (36.9 C)  SpO2: 97% 99%   Vitals:   08/11/23 2309 08/12/23 0405 08/12/23 0801 08/12/23 1130  BP: 132/75 111/79 (!) 160/89 129/84  Pulse: 74 71 60 72  Resp: 16 18 20 18   Temp: 98.2 F (36.8 C) (!) 97.5 F (36.4 C) 98.3 F (36.8 C) 98.4 F (36.9 C)  TempSrc: Oral Oral Oral Oral  SpO2: 95% 97% 97% 99%  Weight:      Height:        General: Pt is alert, awake, not in acute distress Cardiovascular: RRR, S1/S2 +, no rubs, no gallops Respiratory: CTA bilaterally, no wheezing, no rhonchi Abdominal: Soft, NT, ND, bowel sounds + Extremities: no edema, no cyanosis    The results of significant diagnostics from this hospitalization (including imaging, microbiology, ancillary and laboratory) are listed below for reference.     Microbiology: No results found for this or any previous visit (from the past 240 hours).    Labs: BNP (last 3 results) No results for input(s): "BNP" in the last 8760 hours. Basic Metabolic Panel: Recent Labs  Lab 08/08/23 1847 08/09/23 1831 08/10/23 0600 08/11/23 0706 08/12/23 0648  NA 134* 136 134* 138 135  K 3.5 3.8 3.7 3.9 4.2  CL 99 100 100 101 100  CO2 25 25 24 25 26   GLUCOSE 100* 103* 189* 155* 165*  BUN 8 10 12 10 11   CREATININE 0.67 0.58* 0.59* 0.56* 0.67  CALCIUM 8.8* 9.1 9.3 9.6 9.4  MG  --   --  1.8 2.2 2.1  PHOS  --   --  1.6* 2.5 3.6   Liver Function Tests: Recent Labs  Lab 08/10/23 0600  AST 20  ALT 17  ALKPHOS 78  BILITOT 0.7  PROT 7.6  ALBUMIN 3.9   No results for input(s): "LIPASE", "AMYLASE" in the last 168 hours. No results for input(s): "AMMONIA" in the last 168 hours. CBC: Recent Labs  Lab 08/08/23 1847 08/09/23 1831 08/10/23 0600 08/12/23 0648  WBC 10.4 10.2 8.4 12.4*  NEUTROABS  --  6.0  --   --   HGB 14.6 14.6 14.9 15.2  HCT 45.8 45.3 45.8 46.7  MCV 81.6 81.8 81.2 80.4  PLT 237 260 246 269   Cardiac Enzymes: No results for input(s): "CKTOTAL", "CKMB", "CKMBINDEX", "TROPONINI" in the last 168 hours. BNP: Invalid input(s): "POCBNP" CBG: Recent Labs  Lab 08/11/23 1202 08/11/23 1737 08/11/23 2151 08/12/23 0610 08/12/23 1130  GLUCAP 121* 117* 159* 167* 192*   D-Dimer No results for input(s): "DDIMER" in the last 72 hours. Hgb A1c Recent Labs    08/10/23 1117  HGBA1C 5.1   Lipid Profile No results for input(s): "CHOL", "HDL", "LDLCALC", "TRIG", "CHOLHDL", "LDLDIRECT" in the last 72 hours. Thyroid function studies No results for input(s): "TSH", "T4TOTAL", "T3FREE", "THYROIDAB" in the last 72 hours.  Invalid input(s): "FREET3" Anemia work up No results for input(s): "VITAMINB12", "FOLATE", "FERRITIN", "TIBC", "IRON", "RETICCTPCT" in the last 72 hours. Urinalysis    Component Value Date/Time   COLORURINE YELLOW 01/08/2023 0417   APPEARANCEUR CLOUDY (A) 01/08/2023 0417   LABSPEC 1.017 01/08/2023 0417    PHURINE 8.0 01/08/2023 0417   GLUCOSEU NEGATIVE 01/08/2023 0417   HGBUR NEGATIVE 01/08/2023 0417   BILIRUBINUR NEGATIVE 01/08/2023 0417   KETONESUR NEGATIVE 01/08/2023 0417   PROTEINUR NEGATIVE 01/08/2023 0417   UROBILINOGEN 0.2 05/27/2014 0105   NITRITE NEGATIVE 01/08/2023 0417   LEUKOCYTESUR NEGATIVE 01/08/2023 0417   Sepsis Labs Recent Labs  Lab 08/08/23 1847 08/09/23 1831 08/10/23 0600 08/12/23 0648  WBC 10.4 10.2 8.4 12.4*   Microbiology No results found for this or any previous visit (from the past 240 hours).  Time coordinating discharge: Over 30 minutes  SIGNED:   Willeen Niece, MD  [facility] 08/12/2023, 5:05 PM Pager   If 7PM-7AM, please contact night-coverage

## 2023-08-13 LAB — MISC LABCORP TEST (SEND OUT): Labcorp test code: 505625

## 2023-08-14 LAB — NEUROMYELITIS OPTICA AUTOAB, IGG: NMO-IgG: <1.5 U/mL (ref 0.0–3.0)

## 2023-08-14 LAB — MISC LABCORP TEST (SEND OUT): Labcorp test code: 139370

## 2023-08-14 LAB — IGG CSF INDEX
Albumin CSF-mCnc: 57 mg/dL — ABNORMAL HIGH (ref 10–45)
Albumin: 4.3 g/dL (ref 4.1–5.1)
CSF IgG Index: 0.6 (ref 0.0–0.7)
IgG (Immunoglobin G), Serum: 1137 mg/dL (ref 603–1613)
IgG, CSF: 9.3 mg/dL (ref 0.0–10.3)
IgG/Alb Ratio, CSF: 0.16 (ref 0.00–0.25)

## 2023-08-21 LAB — OLIGOCLONAL BANDS, CSF + SERM

## 2023-08-22 LAB — MISC LABCORP TEST (SEND OUT): Labcorp test code: 505575

## 2023-08-30 ENCOUNTER — Telehealth: Payer: Self-pay

## 2023-08-30 NOTE — Telephone Encounter (Signed)
 Patient's mom called the [facility] main line 3/18 leaving a vm about her son. She stated he was recently in the hospital for an abscess in his tooth that spread to his eye. I was able to return her call today and complete a phone screening. The patient is currently working and has an active banking account and tax return. We talked about applying for CAFA due to his recent medical bills. Mom states patient may not be able to gather all of the information in time for the appointment so they will attempt CAFA at a different time. Patient's appt is set for Monday 3/24 at 2pm

## 2023-09-02 NOTE — Congregational Nurse Program (Unsigned)
 Dept: [phone]   Congregational Nurse Program Note  Date of Encounter: 09/02/2023  Past Medical History: Past Medical History:  Diagnosis Date   Bipolar 1 disorder (HCC)    Depression    Family history of BRCA2 gene positive    Family history of colon cancer    Family history of melanoma    Family history of prostate cancer    Leukocytosis 02/06/2021   Sleep disorder     Encounter Details:  Community Questionnaire - 09/02/23 1500       Questionnaire   Ask client: Do you give verbal consent for me to treat you today? Yes    Student Assistance N/A    Location Patient Served  [facility]    Encounter Setting CN site    Population Status Unknown    Insurance Uninsured ([employer]/[facility]/Self-Pay/Medicaid Family Planning)    Insurance/Financial Assistance Referral [employer];Medicaid;[facility] Financial Assistance    Medication N/A;Referred to Medication Assistance    Medical Provider No    Screening Referrals Made N/A    Medical Referrals Made Non-[facility] PCP/Clinic    Medical Appointment Completed N/A    CNP Interventions Advocate/Support;Counsel;Educate    Screenings CN Performed Blood Pressure    ED Visit Averted N/A    Life-Saving Intervention Made N/A             Mr. Millikan [facility] today to enroll in [facility]. 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 He currently lives with his mother.  He was recently admitted to the hospital after sudden loss of vision to his right eye while at work. He was admitted on 08/09/23 and discharged on 08/12/23. The blindness had started that Sunday. He does have a history of same years prior in his Left eye. Also diagnosed with Optic Neuritis. Today he states the vision in his right eye is back to normal. He states he was told that this happened due to dental infection.   Reviewed notes and discharge instructions from admission with client. He has not followed up with  [facility] Neurology due to no insurance and financial reasons of copays. He does have his Amlodipine and Protonix as well has using the Peridex rinse.  He is a smoker, discussed smoking cessation and the effects of smoking on his circulation and blood pressure.  He is alert and oriented and is engaged, answers questions appropriately, very polite. He has no complaints today, He reports his mood is good, no depression, he is concerned about his hospital bills. Reports his right eye vision has returned to baseline.  Reviewed some labs from admission. A1C on 08/10/23 was 5.1  He reports he took his blood pressure medication today and tries to take it on a regular schedule.  Blood pressure today 151/92 in left arm, large cuff, sitting pulse 61 Discussed that provider will continue to monitor his blood pressure and may determine that some changes be made to his medications or dosages. He reports understanding.  Drivers of health reviewed: food insecurity noted , he shopped food market today and was provided a Facilities manager with area food pantries listed also.  PHQ9 = 0  He reports he did receive Covid Vaccine x 2 and has had the influenza vaccine this year.  He wishes to [facility] care with [facility] as he went there previously around 2016 per [facility] notes.  Appointment made for 09/05/23 at 0900  He was provided this appointment in writing and  contact information as well.  Visit summary of today's enrollment provided. He is enrolled in [facility] until 09/01/24 [facility] pending [facility] pending CAFA pending Medicaid application completed Enrollment by A. Spruill today  Plan: will plan follow up with client after his appointment on 09/05/23 Will provide client with [facility] Manager Education on hypertension, DASH diet and smoking cessation. Provided client with my contact information.  Francee Nodal RN [employer]

## 2023-09-05 ENCOUNTER — Encounter: Payer: Self-pay | Admitting: Physician Assistant

## 2023-09-05 ENCOUNTER — Ambulatory Visit: Payer: Self-pay | Admitting: Physician Assistant

## 2023-09-05 VITALS — BP 118/76 | HR 94 | Temp 97.6°F | Ht 69.5 in | Wt 350.5 lb

## 2023-09-05 DIAGNOSIS — Z6841 Body Mass Index (BMI) 40.0 and over, adult: Secondary | ICD-10-CM

## 2023-09-05 DIAGNOSIS — Z131 Encounter for screening for diabetes mellitus: Secondary | ICD-10-CM

## 2023-09-05 DIAGNOSIS — F17200 Nicotine dependence, unspecified, uncomplicated: Secondary | ICD-10-CM

## 2023-09-05 DIAGNOSIS — I10 Essential (primary) hypertension: Secondary | ICD-10-CM

## 2023-09-05 DIAGNOSIS — Z7689 Persons encountering health services in other specified circumstances: Secondary | ICD-10-CM

## 2023-09-05 LAB — POCT GLUCOSE (DEVICE FOR HOME USE): POC Glucose: 126 mg/dL — AB (ref 70–99)

## 2023-09-05 MED ORDER — AMLODIPINE BESYLATE 5 MG PO TABS: 5.0000 mg | ORAL_TABLET | Freq: Every day | ORAL | 0 refills | Status: DC

## 2023-09-05 NOTE — Progress Notes (Signed)
 BP 118/76   Pulse 94   Temp 97.6 F (36.4 C)   Ht 5' 9.5" (1.765 m)   Wt (!) 350 lb 8 oz (159 kg)   SpO2 98%   BMI 51.02 kg/m    Subjective:    Patient ID: Ian Gonzalez, male    DOB: August 08, 1985, 38 y.o.   MRN: [id number]  HPI: Ian Gonzalez is a 38 y.o. male presenting on 09/05/2023 for New Patient (Initial Visit) (Pt's previous PCP was [facility] before losing his insurance. Pt works at [employer]. Pt has been COVID vaccinated 2 doses in 2021. Pt reports no problems or concerns. )   HPI   Chief Complaint  Patient presents with   New Patient (Initial Visit)    Pt's previous PCP was [facility] before losing his insurance. Pt works at [employer]. Pt has been COVID vaccinated 2 doses in 2021. Pt reports no problems or concerns.       Pt in hospital recently for optic neuritis and was discharged on 08/12/2023 on steroids.  He says he finished his prednisone. Pt applied for [employer] financial assistance He has not scheduled with Dr Everlena Cooper yet.  Last OV with Dr Everlena Cooper was in 2023 for optic neuritis.  Pt says he is seeing okay today He is back to work  He sometimes has dental pain but hasn't been to dentist recently.  He says when his dentalgia flares, he just goes to the ER for amoxil and IBU.    He says no issues with his bipolar d/o  Pt says He was only put on the pantoprazole recently due to his prednisone.  Recent records and labs reviewed.   Relevant past medical, surgical, family and social history reviewed and updated as indicated. Interim medical history since our last visit reviewed. Allergies and medications reviewed and updated.   Current Outpatient Medications:    amLODipine (NORVASC) 5 MG tablet, Take 1 tablet (5 mg total) by mouth daily., Disp: 30 tablet, Rfl: 1   chlorhexidine (PERIDEX) 0.12 % solution, Use as directed 15 mLs in the mouth or throat 2 (two) times daily., Disp: 120 mL, Rfl: 0   ibuprofen (ADVIL) 200  MG tablet, Take 400 mg by mouth every 6 (six) hours as needed for mild pain (pain score 1-3)., Disp: , Rfl:    lidocaine (XYLOCAINE) 2 % solution, Use as directed 10 mLs in the mouth or throat every 3 (three) hours as needed., Disp: 100 mL, Rfl: 0   Multiple Vitamin (MULTIVITAMIN) capsule, Take 1 capsule by mouth daily., Disp: , Rfl:    pantoprazole (PROTONIX) 40 MG tablet, Take 1 tablet (40 mg total) by mouth daily., Disp: 30 tablet, Rfl: 1    Review of Systems  Per HPI unless specifically indicated above     Objective:    BP 118/76   Pulse 94   Temp 97.6 F (36.4 C)   Ht 5' 9.5" (1.765 m)   Wt (!) 350 lb 8 oz (159 kg)   SpO2 98%   BMI 51.02 kg/m   Wt Readings from Last 3 Encounters:  09/05/23 (!) 350 lb 8 oz (159 kg)  09/02/23 (!) 325 lb (147.4 kg)  08/09/23 (!) 325 lb (147.4 kg)    Physical Exam Vitals reviewed.  Constitutional:      General: He is not in acute distress.    Appearance: He is well-developed. He is obese. He is not toxic-appearing.  HENT:     Head:  Normocephalic and atraumatic.     Right Ear: Tympanic membrane, ear canal and external ear normal.     Left Ear: Tympanic membrane, ear canal and external ear normal.     Mouth/Throat:     Dentition: Abnormal dentition. Dental caries present. No dental abscesses.     Comments: Many teeth missing. Remaining teeth decayed.  No abscess seen. Eyes:     Extraocular Movements: Extraocular movements intact.     Conjunctiva/sclera: Conjunctivae normal.     Pupils: Pupils are equal, round, and reactive to light.  Neck:     Thyroid: No thyromegaly.  Cardiovascular:     Rate and Rhythm: Normal rate and regular rhythm.  Pulmonary:     Effort: Pulmonary effort is normal.     Breath sounds: Normal breath sounds. No wheezing or rales.  Abdominal:     General: Bowel sounds are normal.     Palpations: Abdomen is soft. There is no mass.     Tenderness: There is no abdominal tenderness.  Musculoskeletal:     Cervical  back: Neck supple.     Right lower leg: No edema.     Left lower leg: No edema.  Lymphadenopathy:     Cervical: No cervical adenopathy.  Skin:    General: Skin is warm and dry.     Findings: No rash.  Neurological:     Mental Status: He is alert and oriented to person, place, and time.     Motor: No weakness or tremor.     Gait: Gait normal.  Psychiatric:        Attention and Perception: Attention normal.        Speech: Speech normal.        Behavior: Behavior normal. Behavior is cooperative.     Comments: Pleasant and engaged     BS  126       Assessment & Plan:    Encounter Diagnoses  Name Primary?   Encounter to establish care Yes   Primary hypertension    Screening for diabetes mellitus    Tobacco use disorder    BMI 50.0-59.9, adult (HCC)       HTN -Continue amlodipine  Dental decay -referred To dentist  Optic neuritis -pt encouraged to call for appointment with Dr Adriana Mccallum   Pt to follow up 6 weeks.  He is to contact office sooner prn

## 2023-09-05 NOTE — Patient Instructions (Addendum)
 Call Dr Everlena Cooper office to schedule follow up appointment- [phone]

## 2023-09-06 ENCOUNTER — Telehealth: Payer: Self-pay

## 2023-09-06 NOTE — Telephone Encounter (Signed)
 Attempted call to [facility] client for follow up after his first appointment to establish medical care with [facility]. No answer, left VM requesting return call. Next appointment scheduled for 10/17/23.  Francee Nodal RN [employer]

## 2023-09-10 ENCOUNTER — Telehealth: Payer: Self-pay

## 2023-09-10 NOTE — Telephone Encounter (Signed)
 Attempted call and his number states he has calls restrictions set up. No answer   Francee Nodal RN [facility]

## 2023-10-17 ENCOUNTER — Ambulatory Visit: Payer: Self-pay | Admitting: Physician Assistant

## 2024-02-25 ENCOUNTER — Encounter (HOSPITAL_COMMUNITY): Payer: Self-pay | Admitting: Emergency Medicine

## 2024-02-25 ENCOUNTER — Other Ambulatory Visit: Payer: Self-pay

## 2024-02-25 ENCOUNTER — Emergency Department (HOSPITAL_COMMUNITY)
Admission: EM | Admit: 2024-02-25 | Discharge: 2024-02-25 | Disposition: A | Discharge status: Home / Self Care | Payer: Self-pay | Attending: Emergency Medicine | Admitting: Emergency Medicine

## 2024-02-25 DIAGNOSIS — B349 Viral infection, unspecified: Secondary | ICD-10-CM | POA: Insufficient documentation

## 2024-02-25 LAB — URINALYSIS, ROUTINE W REFLEX MICROSCOPIC
Bilirubin Urine: NEGATIVE
Glucose, UA: NEGATIVE mg/dL
Hgb urine dipstick: NEGATIVE
Ketones, ur: NEGATIVE mg/dL
Leukocytes,Ua: NEGATIVE
Nitrite: NEGATIVE
Protein, ur: NEGATIVE mg/dL
Specific Gravity, Urine: 1.025 (ref 1.005–1.030)
pH: 5 (ref 5.0–8.0)

## 2024-02-25 LAB — COMPREHENSIVE METABOLIC PANEL WITH GFR
ALT: 15 U/L (ref 0–44)
AST: 16 U/L (ref 15–41)
Albumin: 3.7 g/dL (ref 3.5–5.0)
Alkaline Phosphatase: 72 U/L (ref 38–126)
Anion gap: 8 (ref 5–15)
BUN: 12 mg/dL (ref 6–20)
CO2: 27 mmol/L (ref 22–32)
Calcium: 9.3 mg/dL (ref 8.9–10.3)
Chloride: 104 mmol/L (ref 98–111)
Creatinine, Ser: 0.71 mg/dL (ref 0.61–1.24)
GFR, Estimated: >60 mL/min (ref >60)
Glucose, Bld: 155 mg/dL — ABNORMAL HIGH (ref 70–99)
Potassium: 4.8 mmol/L (ref 3.5–5.1)
Sodium: 139 mmol/L (ref 135–145)
Total Bilirubin: 1 mg/dL (ref 0.0–1.2)
Total Protein: 7.2 g/dL (ref 6.5–8.1)

## 2024-02-25 LAB — CBC WITH DIFFERENTIAL/PLATELET
Abs Immature Granulocytes: 0.07 K/uL (ref 0.00–0.07)
Basophils Absolute: 0.1 K/uL (ref 0.0–0.1)
Basophils Relative: 1 %
Eosinophils Absolute: 0.3 K/uL (ref 0.0–0.5)
Eosinophils Relative: 2 %
HCT: 46.7 % (ref 39.0–52.0)
Hemoglobin: 14.7 g/dL (ref 13.0–17.0)
Immature Granulocytes: 1 %
Lymphocytes Relative: 17 %
Lymphs Abs: 2.2 K/uL (ref 0.7–4.0)
MCH: 26.3 pg (ref 26.0–34.0)
MCHC: 31.5 g/dL (ref 30.0–36.0)
MCV: 83.5 fL (ref 80.0–100.0)
Monocytes Absolute: 0.7 K/uL (ref 0.1–1.0)
Monocytes Relative: 5 %
Neutro Abs: 9.8 K/uL — ABNORMAL HIGH (ref 1.7–7.7)
Neutrophils Relative %: 74 %
Platelets: 292 K/uL (ref 150–400)
RBC: 5.59 MIL/uL (ref 4.22–5.81)
RDW: 13.9 % (ref 11.5–15.5)
WBC: 13.2 K/uL — ABNORMAL HIGH (ref 4.0–10.5)
nRBC: 0 % (ref 0.0–0.2)

## 2024-02-25 LAB — RESP PANEL BY RT-PCR (RSV, FLU A&B, COVID)  RVPGX2
Influenza A by PCR: NEGATIVE
Influenza B by PCR: NEGATIVE
Resp Syncytial Virus by PCR: NEGATIVE
SARS Coronavirus 2 by RT PCR: NEGATIVE

## 2024-02-25 MED ORDER — ONDANSETRON 4 MG PO TBDP
4.0000 mg | ORAL_TABLET | Freq: Once | ORAL | Status: AC
  Administered 2024-02-25: 4 mg via ORAL
  Filled 2024-02-25: qty 1

## 2024-02-25 MED ORDER — PROMETHAZINE HCL 25 MG PO TABS: 25.0000 mg | ORAL_TABLET | Freq: Four times a day (QID) | ORAL | 0 refills | Status: DC | PRN

## 2024-02-25 NOTE — Discharge Instructions (Signed)
 You likely have a viral process.  I recommend that you rest drink plenty of fluids, you may take over-the-counter Tylenol  every 4 hours if needed for fever or bodyaches.  You have been prescribed medication to help with your nausea that may also cause drowsiness.  Do not operate machinery or drive while taking the medication.  I have listed 2 of the local primary care clinics that you may contact one of them to establish primary care.  Return to the emergency department for any new or worsening symptoms.

## 2024-02-25 NOTE — ED Triage Notes (Signed)
 Pt in by POV. States he hasn't been able to sleep well over the last week and has nausea and chills x3 days.

## 2024-02-26 ENCOUNTER — Other Ambulatory Visit: Payer: Self-pay

## 2024-02-26 ENCOUNTER — Emergency Department (HOSPITAL_COMMUNITY)
Admission: EM | Admit: 2024-02-26 | Discharge: 2024-02-26 | Disposition: A | Discharge status: Home / Self Care | Payer: Self-pay | Attending: Emergency Medicine | Admitting: Emergency Medicine

## 2024-02-26 DIAGNOSIS — G4700 Insomnia, unspecified: Secondary | ICD-10-CM | POA: Insufficient documentation

## 2024-02-26 MED ORDER — DIAZEPAM 5 MG/ML IJ SOLN
5.0000 mg | Freq: Once | INTRAMUSCULAR | Status: AC
Start: 2024-02-26 — End: 2024-02-26
  Administered 2024-02-26: 5 mg via INTRAMUSCULAR
  Filled 2024-02-26: qty 2

## 2024-02-26 MED ORDER — DIAZEPAM 5 MG PO TABS: 5.0000 mg | ORAL_TABLET | Freq: Every day | ORAL | 0 refills | Status: DC

## 2024-02-26 NOTE — ED Triage Notes (Signed)
 Pt was seen here yesterday for nausea and inability to sleep, pt was given promethazine  to pick up at pharmacy, pt states that he took the medication and it did help the nausea but states it made him really restless and he had bad case of the restless leg all night and didn't sleep at all states restless in general. Denies any typical allergic reaction symptoms such as hives, itching, throat swelling, etc.

## 2024-02-26 NOTE — ED Provider Notes (Signed)
 [place] EMERGENCY DEPARTMENT AT [facility] Provider Note   CSN: [id number] Arrival date & time: 02/26/24  0303     History Chief Complaint  Patient presents with   Allergic Reaction    HPI Ian Gonzalez is a 38 y.o. male presenting for chief complaint of insomnia. He states that he has a history of insomnia that flares.  He used to be on zolpidem but has not needed it for a while.  Yesterday he came in with nausea vomiting and sleep difficulties and was prescribed Phenergan .  He has a history of dystonic reaction to Seroquel  and had a similar reaction unfortunately to the promethazine .  States that his legs were moving uncontrollably for 4 hours prior to arrival.  No other medications prior to arrival.  Patient's recorded medical, surgical, social, medication list and allergies were reviewed in the Snapshot window as part of the initial history.   Review of Systems   Review of Systems  Constitutional:  Negative for chills and fever.  HENT:  Negative for ear pain and sore throat.   Eyes:  Negative for pain and visual disturbance.  Respiratory:  Negative for cough and shortness of breath.   Cardiovascular:  Negative for chest pain and palpitations.  Gastrointestinal:  Negative for abdominal pain and vomiting.  Genitourinary:  Negative for dysuria and hematuria.  Musculoskeletal:  Negative for arthralgias and back pain.  Skin:  Negative for color change and rash.  Neurological:  Negative for seizures and syncope.  Psychiatric/Behavioral:  Positive for sleep disturbance.   All other systems reviewed and are negative.   Physical Exam Updated Vital Signs BP 132/71 (BP Location: Right Arm)   Pulse 78   Temp 98.4 F (36.9 C) (Oral)   Resp 18   SpO2 96%  Physical Exam Vitals and nursing note reviewed.  Constitutional:      General: He is not in acute distress.    Appearance: He is well-developed.  HENT:     Head: Normocephalic and atraumatic.  Eyes:      Conjunctiva/sclera: Conjunctivae normal.  Cardiovascular:     Rate and Rhythm: Normal rate and regular rhythm.  Pulmonary:     Effort: Pulmonary effort is normal. No respiratory distress.  Abdominal:     General: Abdomen is flat. There is no distension.  Musculoskeletal:        General: No swelling or deformity.  Skin:    General: Skin is warm and dry.     Capillary Refill: Capillary refill takes less than 2 seconds.  Neurological:     Mental Status: He is alert and oriented to person, place, and time. Mental status is at baseline.      ED Course/ Medical Decision Making/ A&P    Procedures Procedures   Medications Ordered in ED Medications  diazepam  (VALIUM ) injection 5 mg (has no administration in time range)    Medical Decision Making:   Is a 38 year old male presenting for insomnia and likely a dystonic reaction to Phenergan .  Both of these complaints should respond to IM Valium  here in the ER. Had a long conversation with the patient regarding sleep habits and importance of not relying on medications but rather environmental changes.  He expressed understanding of these changes.  Discussed exercise throughout the day, avoidance of bluelight before bed, changes in dietary caffeine intake and carb intake. He expressed understanding that medications would likely only provide short-term benefit and do not provide as much restorative sleep and patient expressed understanding.  He has been relying heavily on doxylamine containing products.  We discussed risks of heavy use of antihistamines and similar medications and he expressed understanding. Recommended that he trial melatonin and lifestyle adjustments primarily.  Will use Valium  as a bridge to get him back to a normal sleeping schedule over the next 48 hours and he will transition to melatonin at that point.  He expressed understanding of the plan and feels comfortable at time of discharge. Clinical Impression:  1. Insomnia,  unspecified type      Discharge   Final Clinical Impression(s) / ED Diagnoses Final diagnoses:  Insomnia, unspecified type    Rx / DC Orders ED Discharge Orders          Ordered    diazepam  (VALIUM ) 5 MG tablet  Daily at bedtime        02/26/24 0357              Jerral Meth, MD 02/26/24 [phone]

## 2024-02-28 ENCOUNTER — Other Ambulatory Visit: Payer: Self-pay

## 2024-02-28 ENCOUNTER — Emergency Department (HOSPITAL_COMMUNITY)
Admission: EM | Admit: 2024-02-28 | Discharge: 2024-02-28 | Disposition: A | Discharge status: Against Medical Advice | Payer: Self-pay | Attending: Emergency Medicine | Admitting: Emergency Medicine

## 2024-02-28 ENCOUNTER — Encounter (HOSPITAL_COMMUNITY): Payer: Self-pay

## 2024-02-28 DIAGNOSIS — G4700 Insomnia, unspecified: Secondary | ICD-10-CM | POA: Insufficient documentation

## 2024-02-28 DIAGNOSIS — Z5321 Procedure and treatment not carried out due to patient leaving prior to being seen by health care provider: Secondary | ICD-10-CM | POA: Insufficient documentation

## 2024-02-28 NOTE — ED Triage Notes (Signed)
 Pt seen here two days ago due to insomnia, restless leg and nausea due to nerves. Pt stated that he has an appointment on Monday but the medications that were prescribed two days ago, has not worked.

## 2024-02-29 ENCOUNTER — Encounter (HOSPITAL_COMMUNITY): Payer: Self-pay

## 2024-02-29 ENCOUNTER — Other Ambulatory Visit: Payer: Self-pay

## 2024-02-29 ENCOUNTER — Emergency Department (HOSPITAL_COMMUNITY)
Admission: EM | Admit: 2024-02-29 | Discharge: 2024-02-29 | Disposition: A | Discharge status: Home / Self Care | Payer: Self-pay | Attending: Emergency Medicine | Admitting: Emergency Medicine

## 2024-02-29 DIAGNOSIS — F419 Anxiety disorder, unspecified: Secondary | ICD-10-CM | POA: Insufficient documentation

## 2024-02-29 DIAGNOSIS — G4700 Insomnia, unspecified: Secondary | ICD-10-CM | POA: Insufficient documentation

## 2024-02-29 MED ORDER — ONDANSETRON 4 MG PO TBDP: 4.0000 mg | ORAL_TABLET | Freq: Three times a day (TID) | ORAL | 0 refills | Status: AC | PRN

## 2024-02-29 MED ORDER — DIAZEPAM 5 MG PO TABS
5.0000 mg | ORAL_TABLET | Freq: Once | ORAL | Status: AC
  Administered 2024-02-29: 5 mg via ORAL
  Filled 2024-02-29: qty 1

## 2024-02-29 MED ORDER — ONDANSETRON 4 MG PO TBDP
4.0000 mg | ORAL_TABLET | Freq: Once | ORAL | Status: AC
  Administered 2024-02-29: 4 mg via ORAL
  Filled 2024-02-29: qty 1

## 2024-02-29 MED ORDER — DIAZEPAM 5 MG PO TABS: 5.0000 mg | ORAL_TABLET | Freq: Every day | ORAL | 0 refills | Status: DC

## 2024-02-29 NOTE — ED Provider Notes (Signed)
 [place] EMERGENCY DEPARTMENT AT [facility] Provider Note   CSN: [id number] Arrival date & time: 02/29/24  1743     Patient presents with: Anxiety   Ian Gonzalez is a 38 y.o. male.   Patient to ED with symptoms of anxiety, restlessness that interfere with sleep. Symptoms have been ongoing with recent previous ED visit for same. He reports the medications provided, Valium  and Zofran , help a lot but he ran out of them before being able to see his doctor. No change in symptoms, no new symptoms. He has a scheduled appointment in 2 days.   The history is provided by the patient. No language interpreter was used.  Anxiety       Prior to Admission medications   Medication Sig Start Date End Date Taking? Authorizing Provider  ondansetron  (ZOFRAN -ODT) 4 MG disintegrating tablet Take 1 tablet (4 mg total) by mouth every 8 (eight) hours as needed for nausea or vomiting. 02/29/24  Yes Braxton Weisbecker, PA-C  diazepam  (VALIUM ) 5 MG tablet Take 1 tablet (5 mg total) by mouth at bedtime. 02/29/24   Odell Balls, PA-C  ibuprofen  (ADVIL ) 200 MG tablet Take 400 mg by mouth every 6 (six) hours as needed for mild pain (pain score 1-3).    [provider]  Multiple Vitamin (MULTIVITAMIN) capsule Take 1 capsule by mouth daily.    [provider]  pantoprazole  (PROTONIX ) 40 MG tablet Take 1 tablet (40 mg total) by mouth daily. Patient not taking: Reported on 02/25/2024 08/12/23 08/11/24  Leotis Bogus, MD  promethazine  (PHENERGAN ) 25 MG tablet Take 1 tablet (25 mg total) by mouth every 6 (six) hours as needed for nausea or vomiting. May cause drowsiness, do not operate machinery or drive while taking this medication 02/25/24   Triplett, Tammy, PA-C    Allergies: Nyquil hbp cold & flu [dm-doxylamine-acetaminophen ] and Promethazine     Review of Systems  Updated Vital Signs BP (!) 154/108   Pulse (!) 109   Temp 99.3 F (37.4 C) (Oral)   Resp 18   Ht 5' 10 (1.778 m)   Wt  (!) 158 kg   SpO2 97%   BMI 49.98 kg/m   Physical Exam Constitutional:      Appearance: He is well-developed.  Cardiovascular:     Rate and Rhythm: Normal rate.  Pulmonary:     Effort: Pulmonary effort is normal.  Musculoskeletal:        General: Normal range of motion.     Cervical back: Normal range of motion.     Right lower leg: No edema.     Left lower leg: No edema.  Skin:    General: Skin is warm and dry.  Neurological:     Mental Status: He is alert and oriented to person, place, and time.     (all labs ordered are listed, but only abnormal results are displayed) Labs Reviewed - No data to display  EKG: None  Radiology: No results found.   Procedures   Medications Ordered in the ED  diazepam  (VALIUM ) tablet 5 mg (5 mg Oral Given 02/29/24 1922)  ondansetron  (ZOFRAN -ODT) disintegrating tablet 4 mg (4 mg Oral Given 02/29/24 1923)    Clinical Course as of 02/29/24 1927  Sat Feb 29, 2024  1922 Patient with continued difficulty sleeping and feelings of anxiousness. Seen on 9/17 for same and prescribed Valium  5 mg with plan to transition to Melatonin after this. He has an appointment with PCP in 2 days. He does  not feel Melatonin is effective. Discussed limitations of prescribing medications such as valium  recurrently in the ED and strongly encouraged him to keep the appointment on Monday with his doctor.  [SU]    Clinical Course User Index [SU] Odell Balls, PA-C                                 Medical Decision Making Risk Prescription drug management.        Final diagnoses:  Anxiety  Insomnia, unspecified type    ED Discharge Orders          Ordered    diazepam  (VALIUM ) 5 MG tablet  Daily at bedtime        02/29/24 1927    ondansetron  (ZOFRAN -ODT) 4 MG disintegrating tablet  Every 8 hours PRN        02/29/24 1927               Odell Balls, PA-C 02/29/24 1927    Towana Ozell BROCKS, MD 03/01/24 1023

## 2024-02-29 NOTE — ED Triage Notes (Signed)
 Pt seen here yesterday and two days ago due to insomnia, restless leg and nausea due to nerves. Pt stated that he has an appointment on Monday but the medications that were prescribed two days ago, has not worked. Pt left before being seen yesterday.

## 2024-02-29 NOTE — Discharge Instructions (Signed)
 As we discussed, please keep the appointment with your doctor on Monday for further management of your symptoms of restlessness and trouble sleeping.

## 2024-02-29 NOTE — ED Provider Notes (Signed)
 [place] EMERGENCY DEPARTMENT AT [facility] Provider Note   CSN: [id number] Arrival date & time: 02/25/24  9175     Patient presents with: Nausea   Ian Gonzalez is a 38 y.o. male.   HPI     Ian Gonzalez is a 38 y.o. male who presents to the Emergency Department complaining of insomnia, chills and nausea x 3 days. He has tried several OTC medications for sleep w/o improvement.  Not sure of possible sick contact.  He denies headache, dizziness, fever, abd pain, shortness of breath, vomiting or diarrhea.  Denies any increased stressors  Prior to Admission medications   Medication Sig Start Date End Date Taking? Authorizing Provider  ibuprofen  (ADVIL ) 200 MG tablet Take 400 mg by mouth every 6 (six) hours as needed for mild pain (pain score 1-3).   Yes [provider]  Multiple Vitamin (MULTIVITAMIN) capsule Take 1 capsule by mouth daily.   Yes [provider]  promethazine  (PHENERGAN ) 25 MG tablet Take 1 tablet (25 mg total) by mouth every 6 (six) hours as needed for nausea or vomiting. May cause drowsiness, do not operate machinery or drive while taking this medication 02/25/24  Yes Demitrious Mccannon, PA-C  diazepam  (VALIUM ) 5 MG tablet Take 1 tablet (5 mg total) by mouth at bedtime. 02/26/24   Jerral Meth, MD  pantoprazole  (PROTONIX ) 40 MG tablet Take 1 tablet (40 mg total) by mouth daily. Patient not taking: Reported on 02/25/2024 08/12/23 08/11/24  Leotis Bogus, MD    Allergies: Nyquil hbp cold & flu [dm-doxylamine-acetaminophen ] and Promethazine     Review of Systems  Constitutional:  Positive for chills. Negative for fever.  HENT:  Negative for congestion and sore throat.   Eyes:  Negative for pain and visual disturbance.  Respiratory:  Negative for cough.   Cardiovascular:  Negative for chest pain and leg swelling.  Gastrointestinal:  Positive for nausea. Negative for abdominal pain, diarrhea and vomiting.  Musculoskeletal:  Negative for  arthralgias, back pain, neck pain and neck stiffness.  Skin:  Negative for rash.  Neurological:  Negative for dizziness, weakness, numbness and headaches.    Updated Vital Signs BP (!) 141/56 (BP Location: Right Wrist)   Pulse 80   Temp 98.3 F (36.8 C) (Oral)   Resp 18   Ht 5' 10 (1.778 m)   Wt (!) 158.8 kg   SpO2 97%   BMI 50.22 kg/m   Physical Exam Vitals and nursing note reviewed.  Constitutional:      General: He is not in acute distress.    Appearance: Normal appearance. He is not ill-appearing or toxic-appearing.  HENT:     Mouth/Throat:     Mouth: Mucous membranes are moist.     Pharynx: Oropharynx is clear.  Eyes:     Extraocular Movements: Extraocular movements intact.     Conjunctiva/sclera: Conjunctivae normal.     Pupils: Pupils are equal, round, and reactive to light.  Cardiovascular:     Rate and Rhythm: Normal rate.     Pulses: Normal pulses.  Pulmonary:     Effort: Pulmonary effort is normal.     Breath sounds: Normal breath sounds.  Abdominal:     Palpations: Abdomen is soft.     Tenderness: There is no abdominal tenderness.  Musculoskeletal:        General: Normal range of motion.     Cervical back: Normal range of motion. No rigidity.  Lymphadenopathy:     Cervical: No cervical adenopathy.  Skin:    General: Skin is warm.     Capillary Refill: Capillary refill takes less than 2 seconds.  Neurological:     General: No focal deficit present.     Mental Status: He is alert.     Sensory: No sensory deficit.     Motor: No weakness.     (all labs ordered are listed, but only abnormal results are displayed) Labs Reviewed  CBC WITH DIFFERENTIAL/PLATELET - Abnormal; Notable for the following components:      Result Value   WBC 13.2 (*)    Neutro Abs 9.8 (*)    All other components within normal limits  COMPREHENSIVE METABOLIC PANEL WITH GFR - Abnormal; Notable for the following components:   Glucose, Bld 155 (*)    All other components  within normal limits  URINALYSIS, ROUTINE W REFLEX MICROSCOPIC - Abnormal; Notable for the following components:   APPearance HAZY (*)    All other components within normal limits  RESP PANEL BY RT-PCR (RSV, FLU A&B, COVID)  [id number]    EKG: None  Radiology: No results found.   Procedures   Medications Ordered in the ED  ondansetron  (ZOFRAN -ODT) disintegrating tablet 4 mg (4 mg Oral Given 02/25/24 1008)                                    Medical Decision Making Pt here with insomnia, nausea and chills.  No headache, neck stiffness or fever.   NV intact.  Doubt emergent process.  Suspect viral process.  Amount and/or Complexity of Data Reviewed Labs: ordered.    Details: Labs w/o significant findings Discussion of management or test interpretation with external provider(s): Doubt emergent process.  NV intact.  Covid neg.    Appears approp for d/c home.  Will provide rx for anti emetic. Sx's improved after medication here,  Return precautions given.    Risk Prescription drug management.        Final diagnoses:  Viral illness    ED Discharge Orders          Ordered    promethazine  (PHENERGAN ) 25 MG tablet  Every 6 hours PRN        02/25/24 1149               Herlinda Milling, PA-C 02/29/24 1316    Dean Clarity, MD 02/29/24 1425

## 2024-03-02 ENCOUNTER — Ambulatory Visit: Payer: Self-pay | Admitting: Physician Assistant

## 2024-03-02 ENCOUNTER — Encounter: Payer: Self-pay | Admitting: Physician Assistant

## 2024-03-02 VITALS — BP 168/98 | HR 98 | Temp 98.3°F | Wt 341.0 lb

## 2024-03-02 DIAGNOSIS — I10 Essential (primary) hypertension: Secondary | ICD-10-CM

## 2024-03-02 DIAGNOSIS — Z6841 Body Mass Index (BMI) 40.0 and over, adult: Secondary | ICD-10-CM

## 2024-03-02 DIAGNOSIS — F319 Bipolar disorder, unspecified: Secondary | ICD-10-CM

## 2024-03-02 DIAGNOSIS — F419 Anxiety disorder, unspecified: Secondary | ICD-10-CM

## 2024-03-02 DIAGNOSIS — G4700 Insomnia, unspecified: Secondary | ICD-10-CM

## 2024-03-02 MED ORDER — DIAZEPAM 5 MG PO TABS: 5.0000 mg | ORAL_TABLET | Freq: Every day | ORAL | 0 refills | Status: DC

## 2024-03-02 MED ORDER — METOPROLOL TARTRATE 50 MG PO TABS: 50.0000 mg | ORAL_TABLET | Freq: Two times a day (BID) | ORAL | 0 refills | Status: DC

## 2024-03-02 MED ORDER — DIAZEPAM 5 MG PO TABS: 5.0000 mg | ORAL_TABLET | Freq: Every day | ORAL | 0 refills | Status: AC

## 2024-03-02 NOTE — Progress Notes (Signed)
 BP (!) 168/98   Pulse 98   Temp 98.3 F (36.8 C)   Wt (!) 341 lb (154.7 kg)   SpO2 98%   BMI 48.93 kg/m    Subjective:    Patient ID: Ian Gonzalez, male    DOB: 03/26/1986, 38 y.o.   MRN: [id number]  HPI: Ian Gonzalez is a 38 y.o. male presenting on 03/02/2024 for No chief complaint on file.   HPI   Pt is 38yoM with history htn and bipolar d/o.  He was last seen in March.   He was a no-show to his follow up appointment in May  Pt says he has Been off bp meds for months.  He Was on amlodipine  when seen in march.  He got laid off from work about two weeks ago.   He hasn't been able to sleep since.   Pt has been to ER three times in past week.   Pt was given two prescriptions for valium , one on 02/26/24 and another on 02/29/24  Pt says he takes 2 valium  because I'm such a big guy   Pt reports a lot of stress in addition to being unemployed.  He says he is Going through a custody battle.  He says he is Very tired- can't sleep.  Pt with hx bipolar  Took the last of the valium  last night.   Pt says no MH specialist since he was a kid- he says they told him him he was bipolar- they started seroquel  in prison.  hE says he Got out of prison in 2009  Pt says he stopped the serorquel because it started giving him restless leg.    Pt says [facility] was [facility] at that time.    He says they tried a couple other meds but nothing else worked.   He says his mom is disbled and his baby momma is a drug user.  Pt denies use of etoh or drugs.  He denies SI, HI.    Relevant past medical, surgical, family and social history reviewed and updated as indicated. Interim medical history since our last visit reviewed. Allergies and medications reviewed and updated.  CURRENT MEDS: Valium  5mg  MVI Zofran -odt   Review of Systems  Per HPI unless specifically indicated above     Objective:    BP (!) 168/98   Pulse 98   Temp 98.3 F (36.8 C)   Wt (!) 341 lb (154.7 kg)   SpO2 98%    BMI 48.93 kg/m   Wt Readings from Last 3 Encounters:  03/02/24 (!) 341 lb (154.7 kg)  02/29/24 (!) 348 lb 5.2 oz (158 kg)  02/28/24 (!) 350 lb 1.5 oz (158.8 kg)    Physical Exam Constitutional:      General: He is not in acute distress.    Appearance: He is obese. He is not toxic-appearing.  HENT:     Head: Normocephalic and atraumatic.  Cardiovascular:     Rate and Rhythm: Normal rate and regular rhythm.  Pulmonary:     Effort: Pulmonary effort is normal. No respiratory distress.     Breath sounds: Normal breath sounds.  Musculoskeletal:     Right lower leg: No edema.     Left lower leg: No edema.  Neurological:     Mental Status: He is alert and oriented to person, place, and time.  Psychiatric:        Mood and Affect: Mood is anxious.  Behavior: Behavior normal. Behavior is cooperative.        PHQ-9 score 9 GAD-7 score 18      Assessment & Plan:   Encounter Diagnoses  Name Primary?   Primary hypertension Yes   Anxiety    Insomnia, unspecified type    Bipolar affective disorder, remission status unspecified (HCC)    BMI 45.0-49.9, adult (HCC)       Htn -rx metoprolol .  Change from amlodipine  due to anxiety  Anxiety/insomnia -pt is scheduled to see [facility].  He says he can't make it in tomorrow but he is scheduled to see her next Tuesday.   -pt is to go to [facility].  Discussed that his bipolar history and current anxiety needs evaluation by psychiatrist.  Discussed walk-in options at that facility -pt is given rx for valium  #8 and is counseled to not take more than the prescribed instructions.  Discussed with pt that this is not a long term solution, that this office typically doesn't rx this medication at all.  Unemployed -pt is given career development information through [facility] and is encouraged to call them  Uninsured -pt is to contact [facility] to apply for medicaid.  He is also to discuss transportation needs  Pt to [facility] next Tuesday.  He is to  contact office sooner prn

## 2024-03-02 NOTE — Patient Instructions (Signed)
 [facility]- apply medicaid [facility]- Publishing rights manager

## 2024-03-03 ENCOUNTER — Telehealth: Payer: Self-pay

## 2024-03-03 NOTE — Telephone Encounter (Signed)
 Attempted follow up call of [facility] client today after ED visits and recent visit to Primary Care at [facility]. No answer today, left Voicemail requesting return call.  Plan: arrange for client to come back into [facility] to apply for Medicaid. He is currently not working, did not complete application on enrollment, needed further documents. He also could benefit from [facility] as well as [facility] Financial assistance for recent visits. Also follow up for blood pressure and new medication as well as MH referral. Client was recommended by provider to go to [facility] for follow up. He was seen there in the past.   Avelina JONELLE Skeen RN Clara Gunn/[facility]

## 2024-03-10 ENCOUNTER — Encounter: Payer: Self-pay | Admitting: Physician Assistant

## 2024-03-10 ENCOUNTER — Ambulatory Visit: Payer: Self-pay

## 2024-03-10 ENCOUNTER — Ambulatory Visit: Payer: Self-pay | Admitting: Physician Assistant

## 2024-03-10 VITALS — BP 118/86 | HR 99 | Temp 97.9°F | Ht 69.5 in | Wt 347.0 lb

## 2024-03-10 DIAGNOSIS — F419 Anxiety disorder, unspecified: Secondary | ICD-10-CM

## 2024-03-10 DIAGNOSIS — I10 Essential (primary) hypertension: Secondary | ICD-10-CM

## 2024-03-10 MED ORDER — METOPROLOL TARTRATE 50 MG PO TABS: 50.0000 mg | ORAL_TABLET | Freq: Two times a day (BID) | ORAL | 1 refills | Status: AC

## 2024-03-10 NOTE — Progress Notes (Signed)
   BP 118/86   Pulse 99   Temp 97.9 F (36.6 C)   Ht 5' 9.5 (1.765 m)   Wt (!) 347 lb (157.4 kg)   SpO2 96%   BMI 50.51 kg/m    Subjective:    Patient ID: Ian Gonzalez, male    DOB: [birth year], 38 y.o.   MRN: [id number]  HPI: Ian Gonzalez is a 38 y.o. male presenting on 03/10/2024 for Follow-up   HPI    Pt is 38yoM who is in today for recheck mood and bp.   He says he [facility]'t [facility] to [facility].  He is sleeping better.  He feels great today.  Pt met with [facility] prior to this appointment and he says it went well. Pt says he has had several interviews and hopes to get a new job soon.     Relevant past medical, surgical, family and social history reviewed and updated as indicated. Interim medical history since our last visit reviewed. Allergies and medications reviewed and updated.    Current Outpatient Medications:    diazepam  (VALIUM ) 5 MG tablet, Take 1 tablet (5 mg total) by mouth at bedtime., Disp: 8 tablet, Rfl: 0   metoprolol  tartrate (LOPRESSOR ) 50 MG tablet, Take 1 tablet (50 mg total) by mouth 2 (two) times daily., Disp: 180 tablet, Rfl: 0   Multiple Vitamin (MULTIVITAMIN) capsule, Take 1 capsule by mouth daily., Disp: , Rfl:    ondansetron  (ZOFRAN -ODT) 4 MG disintegrating tablet, Take 1 tablet (4 mg total) by mouth every 8 (eight) hours as needed for nausea or vomiting. (Patient not taking: Reported on 03/10/2024), Disp: 10 tablet, Rfl: 0   Review of Systems  Per HPI unless specifically indicated above     Objective:    BP 118/86   Pulse 99   Temp 97.9 F (36.6 C)   Ht 5' 9.5 (1.765 m)   Wt (!) 347 lb (157.4 kg)   SpO2 96%   BMI 50.51 kg/m   Wt Readings from Last 3 Encounters:  03/10/24 (!) 347 lb (157.4 kg)  03/02/24 (!) 341 lb (154.7 kg)  02/29/24 (!) 348 lb 5.2 oz (158 kg)    Physical Exam Constitutional:      General: He is not in acute distress.    Appearance: He is not toxic-appearing.  HENT:     Head: Normocephalic and atraumatic.   Cardiovascular:     Rate and Rhythm: Normal rate and regular rhythm.  Pulmonary:     Effort: Pulmonary effort is normal. No respiratory distress.     Breath sounds: Normal breath sounds.  Musculoskeletal:     Right lower leg: No edema.     Left lower leg: No edema.  Neurological:     Mental Status: He is alert and oriented to person, place, and time.  Psychiatric:        Behavior: Behavior normal.     Comments: Pt without obvious anxiety or other distress today           Assessment & Plan:     Encounter Diagnosis  Name Primary?   Primary hypertension Yes    Pt to continue metoprolol  Pt encouraged to get to care [facility] to sign up for medassist and apply for medicaid Pt will follow up in one month.  He is to contact office sooner prn

## 2024-03-30 NOTE — Progress Notes (Signed)
 03/30/24 Retroactively signed due to access blocked.  This is session #1 with Ian Gonzalez for individual [facility] services for anxiety. Patient presents alone. BHP spent 30 minutes with patient.    Patient reports anxious symptoms including racing thoughts, intrusive worries and autonomic hyperactivity. Symptoms are related to socioeconomic factors and interpersonal concerns. Patient will likely benefit from individual support services to aid coping, decision-making, and strengths-based perspective as desired.   This procedure has been fully reviewed with the patient and informed consent has been obtained.   Patient location: Pt seen in clinic via telehealth.   I connected with [name] on 03/10/24 by a video enabled telemedicine application and verified that I am speaking with the correct person.   I discussed the limitations of evaluation and management by telemedicine. The patient expressed understanding and agreed to proceed.     ASSESSMENT AND PLAN   Current diagnosis: anxiety   We created the following treatment plan at today's appointment:               1)  03/10/24               2)  Decrease anxious symptoms through increased support, processing, and coping and self-care skills               3)  Medication management with Provider. On 03/10/24, pt reports a felt improvement overall attributed to medication.               4)  Next screening due in 6 sessions.     HISTORY OF PRESENT ILLNESS   Mental Status: [place] presented oriented X4. Mood and affect were euthymic and appropriate. Judgment was appropriate. Memory was appropriate. Thought processes and content were  appropriate. Speech was appropriate. Patient denies SI/HI.     Narrative: Met with pt for scheduled appointment. Introduced Research scientist (physical sciences) health services and informed consent obtained verbally. Person-centered approach used to begin therapeutic alliance. Pt describes presenting concern as anxiety related to recent job loss  and interpersonal stressors. Explored concerns, contributing factors, supports, and goals. Support provided to normalize experience and organize pt's short-term and long-term goals. Pt names symptoms are well managed through medication at this time. Ensure pt is aware they can f/u as needed. Assessed for other concerns pt wished to address today, which they denied.   Effectiveness of the intervention(s) and the beneficiary's response or progress toward goal(s):  Patient is in a contemplative stage of change to engage in treatment plan

## 2024-04-07 ENCOUNTER — Ambulatory Visit: Payer: Self-pay | Admitting: Physician Assistant

## 2024-06-24 ENCOUNTER — Ambulatory Visit
Admission: EM | Admit: 2024-06-24 | Discharge: 2024-06-24 | Disposition: A | Discharge status: Home / Self Care | Payer: Self-pay | Attending: Family Medicine | Admitting: Family Medicine

## 2024-06-24 DIAGNOSIS — Z20828 Contact with and (suspected) exposure to other viral communicable diseases: Secondary | ICD-10-CM

## 2024-06-24 DIAGNOSIS — J069 Acute upper respiratory infection, unspecified: Secondary | ICD-10-CM

## 2024-06-24 LAB — POC COVID19/FLU A&B COMBO
Covid Antigen, POC: NEGATIVE
Influenza A Antigen, POC: NEGATIVE
Influenza B Antigen, POC: NEGATIVE

## 2024-06-24 MED ORDER — OSELTAMIVIR PHOSPHATE 75 MG PO CAPS: 75.0000 mg | ORAL_CAPSULE | Freq: Two times a day (BID) | ORAL | 0 refills | Status: AC

## 2024-06-24 MED ORDER — AZELASTINE HCL 0.1 % NA SOLN: 1.0000 | Freq: Two times a day (BID) | NASAL | 0 refills | Status: AC

## 2024-06-24 MED ORDER — BENZONATATE 200 MG PO CAPS: 200.0000 mg | ORAL_CAPSULE | Freq: Three times a day (TID) | ORAL | 0 refills | Status: AC | PRN

## 2024-06-24 NOTE — ED Triage Notes (Signed)
 Pt reports cough, congestion, body aches, hoarseness, headache x 1 day.

## 2024-06-24 NOTE — ED Provider Notes (Signed)
 " [facility]    CSN: [id number] Arrival date & time: 06/24/24  9157      History   Chief Complaint No chief complaint on file.   HPI Ian Gonzalez is a 39 y.o. male.   Patient presenting today with 1 day history of cough, congestion, body aches, hoarseness, headache, sore throat, fatigue.  Denies fever, chest pain, shortness of breath, vomiting, diarrhea, rashes.  So far not trying anything over-the-counter for symptoms.  States he has been taking care of his mother who has the flu.    Past Medical History:  Diagnosis Date   Bipolar 1 disorder (HCC)    Depression    Family history of BRCA2 gene positive    Family history of colon cancer    Family history of melanoma    Family history of prostate cancer    GERD (gastroesophageal reflux disease)    Hypertension    Leukocytosis 02/06/2021   Sleep disorder     Patient Active Problem List   Diagnosis Date Noted   Hypophosphatemia 08/10/2023   Essential (primary) hypertension 08/10/2023   Optic neuritis 08/09/2023   Obesity, Class III, BMI 40-49.9 (morbid obesity) (HCC) 08/09/2023   SBO (small bowel obstruction) (HCC) 01/08/2023   Enteritis 01/08/2023   Genetic testing 07/18/2022   Family history of prostate cancer 07/04/2022   Family history of colon cancer 07/04/2022   Family history of melanoma 07/04/2022   Family history of BRCA2 gene positive 07/04/2022   Encounter for general adult medical examination with abnormal findings 02/21/2022   Insomnia 05/19/2021   Gastroesophageal reflux disease 05/19/2021   Subclinical hyperthyroidism 05/19/2021   H/O PSVT (paroxysmal supraventricular tachycardia) 02/08/2021   History of optic neuritis 02/06/2021   Obesity (BMI 30-39.9) 02/06/2021   Tobacco abuse 02/06/2021    Past Surgical History:  Procedure Laterality Date   CHOLECYSTECTOMY N/A 03/18/2015   Procedure: LAPAROSCOPIC CHOLECYSTECTOMY;  Surgeon: Oneil Budge, MD;  Location: [facility];  Service: General;   Laterality: N/A;   NO PAST SURGERIES         Home Medications    Prior to Admission medications  Medication Sig Start Date End Date Taking? Authorizing Provider  azelastine  (ASTELIN ) 0.1 % nasal spray Place 1 spray into both nostrils 2 (two) times daily. Use in each nostril as directed 06/24/24  Yes Stuart Vernell Norris, PA-C  benzonatate  (TESSALON ) 200 MG capsule Take 1 capsule (200 mg total) by mouth 3 (three) times daily as needed for cough. 06/24/24  Yes Stuart Vernell Norris, PA-C  oseltamivir  (TAMIFLU ) 75 MG capsule Take 1 capsule (75 mg total) by mouth every 12 (twelve) hours. 06/24/24  Yes Stuart Vernell Norris, PA-C  diazepam  (VALIUM ) 5 MG tablet Take 1 tablet (5 mg total) by mouth at bedtime. 03/02/24   Comer Kirsch, PA-C  metoprolol  tartrate (LOPRESSOR ) 50 MG tablet Take 1 tablet (50 mg total) by mouth 2 (two) times daily. 03/10/24   Comer Kirsch, PA-C  Multiple Vitamin (MULTIVITAMIN) capsule Take 1 capsule by mouth daily.    [provider]  ondansetron  (ZOFRAN -ODT) 4 MG disintegrating tablet Take 1 tablet (4 mg total) by mouth every 8 (eight) hours as needed for nausea or vomiting. Patient not taking: Reported on 03/10/2024 02/29/24   Odell Balls, PA-C    Family History Family History  Problem Relation Age of Onset   COPD Mother    Hypertension Mother    Cancer Father 76       oral cancer   Head &  neck cancer Father    Heart failure Maternal Aunt    Congestive Heart Failure Maternal Uncle    Melanoma Paternal Aunt 48       x2   Melanoma Paternal Aunt    Cancer Paternal Uncle        prostate cancer   Prostate cancer Paternal Uncle 67       BRCA2 [phone]*   Heart failure Maternal Grandmother    Heart attack Maternal Grandfather    Colon cancer Paternal Grandmother    Prostate cancer Paternal Grandfather     Social History Social History[1]   Allergies   Nyquil hbp cold & flu [dm-doxylamine-acetaminophen ] and Promethazine    Review of  Systems Review of Systems Per HPI  Physical Exam Triage Vital Signs ED Triage Vitals  Encounter Vitals Group     BP 06/24/24 0937 (!) 157/90     Girls Systolic BP Percentile --      Girls Diastolic BP Percentile --      Boys Systolic BP Percentile --      Boys Diastolic BP Percentile --      Pulse Rate 06/24/24 0937 83     Resp 06/24/24 0937 20     Temp 06/24/24 0937 98.1 F (36.7 C)     Temp Source 06/24/24 0937 Oral     SpO2 06/24/24 0937 95 %     Weight --      Height --      Head Circumference --      Peak Flow --      Pain Score 06/24/24 0941 0     Pain Loc --      Pain Education --      Exclude from Growth Chart --    No data found.  Updated Vital Signs BP (!) 157/90 (BP Location: Right Arm)   Pulse 83   Temp 98.1 F (36.7 C) (Oral)   Resp 20   SpO2 95%   Visual Acuity Right Eye Distance:   Left Eye Distance:   Bilateral Distance:    Right Eye Near:   Left Eye Near:    Bilateral Near:     Physical Exam Vitals and nursing note reviewed.  Constitutional:      Appearance: He is well-developed.  HENT:     Head: Atraumatic.     Right Ear: External ear normal.     Left Ear: External ear normal.     Nose: Rhinorrhea present.     Mouth/Throat:     Pharynx: Posterior oropharyngeal erythema present. No oropharyngeal exudate.  Eyes:     Conjunctiva/sclera: Conjunctivae normal.     Pupils: Pupils are equal, round, and reactive to light.  Cardiovascular:     Rate and Rhythm: Normal rate and regular rhythm.  Pulmonary:     Effort: Pulmonary effort is normal. No respiratory distress.     Breath sounds: No wheezing or rales.  Musculoskeletal:        General: Normal range of motion.     Cervical back: Normal range of motion and neck supple.  Lymphadenopathy:     Cervical: No cervical adenopathy.  Skin:    General: Skin is warm and dry.  Neurological:     Mental Status: He is alert and oriented to person, place, and time.  Psychiatric:        Behavior:  Behavior normal.      UC Treatments / Results  Labs (all labs ordered are listed, but only abnormal results are displayed)  Labs Reviewed  POC COVID19/FLU A&B COMBO    EKG   Radiology No results found.  Procedures Procedures (including critical care time)  Medications Ordered in UC Medications - No data to display  Initial Impression / Assessment and Plan / UC Course  I have reviewed the triage vital signs and the nursing notes.  Pertinent labs & imaging results that were available during my care of the patient were reviewed by me and considered in my medical decision making (see chart for details).     Hypertensive in triage, otherwise vital signs reassuring.  Rapid flu negative but given symptoms and home exposure we will treat for flu with Tamiflu , Astelin , Tessalon , supportive over-the-counter medications and home care.  Return for worsening or unresolving symptoms.  Final Clinical Impressions(s) / UC Diagnoses   Final diagnoses:  Viral URI with cough  Exposure to influenza   Discharge Instructions   None    ED Prescriptions     Medication Sig Dispense Auth. Provider   oseltamivir  (TAMIFLU ) 75 MG capsule Take 1 capsule (75 mg total) by mouth every 12 (twelve) hours. 10 capsule Stuart Vernell Norris, PA-C   azelastine  (ASTELIN ) 0.1 % nasal spray Place 1 spray into both nostrils 2 (two) times daily. Use in each nostril as directed 30 mL Stuart Vernell Norris, PA-C   benzonatate  (TESSALON ) 200 MG capsule Take 1 capsule (200 mg total) by mouth 3 (three) times daily as needed for cough. 20 capsule Stuart Vernell Norris, [place]      PDMP not reviewed this encounter.    [1]  Social History Tobacco Use   Smoking status: Every Day    Average packs/day: 0.7 packs/day for 26.0 years (18.0 ttl pk-yrs)    Types: Cigarettes    Start date: 2009    Passive exposure: Never   Smokeless tobacco: Never  Vaping Use   Vaping status: Some Days  Substance Use Topics    Alcohol use: Yes    Comment: Occassionally    Drug use: No     Stuart Vernell Norris, PA-C 06/24/24 1102  "
